# Patient Record
Sex: Female | Born: 1940 | Race: White | Hispanic: No | Smoking: Former smoker
Health system: Southern US, Community
[De-identification: ages and names within clinical notes are randomized; demographics above are authoritative.]

## PROBLEM LIST (undated history)

## (undated) DIAGNOSIS — E785 Hyperlipidemia, unspecified: Secondary | ICD-10-CM

## (undated) DIAGNOSIS — Z8489 Family history of other specified conditions: Secondary | ICD-10-CM

## (undated) DIAGNOSIS — I10 Essential (primary) hypertension: Secondary | ICD-10-CM

## (undated) DIAGNOSIS — E119 Type 2 diabetes mellitus without complications: Secondary | ICD-10-CM

## (undated) HISTORY — DX: Hyperlipidemia, unspecified: E78.5

## (undated) HISTORY — DX: Type 2 diabetes mellitus without complications: E11.9

## (undated) HISTORY — DX: Essential (primary) hypertension: I10

## (undated) HISTORY — PX: OTHER SURGICAL HISTORY: SHX169

---

## 2009-07-02 DIAGNOSIS — N3946 Mixed incontinence: Secondary | ICD-10-CM | POA: Insufficient documentation

## 2009-07-02 DIAGNOSIS — R339 Retention of urine, unspecified: Secondary | ICD-10-CM | POA: Insufficient documentation

## 2010-05-29 HISTORY — PX: SMALL INTESTINE SURGERY: SHX150

## 2013-05-29 HISTORY — PX: COLONOSCOPY: SHX174

## 2013-06-02 DIAGNOSIS — I10 Essential (primary) hypertension: Secondary | ICD-10-CM | POA: Diagnosis not present

## 2013-06-03 DIAGNOSIS — J301 Allergic rhinitis due to pollen: Secondary | ICD-10-CM | POA: Diagnosis not present

## 2013-06-05 DIAGNOSIS — J301 Allergic rhinitis due to pollen: Secondary | ICD-10-CM | POA: Diagnosis not present

## 2013-06-09 DIAGNOSIS — J3489 Other specified disorders of nose and nasal sinuses: Secondary | ICD-10-CM | POA: Diagnosis not present

## 2013-06-09 DIAGNOSIS — J3089 Other allergic rhinitis: Secondary | ICD-10-CM | POA: Diagnosis not present

## 2013-06-09 DIAGNOSIS — J343 Hypertrophy of nasal turbinates: Secondary | ICD-10-CM | POA: Diagnosis not present

## 2013-06-09 DIAGNOSIS — J342 Deviated nasal septum: Secondary | ICD-10-CM | POA: Diagnosis not present

## 2013-06-20 DIAGNOSIS — J301 Allergic rhinitis due to pollen: Secondary | ICD-10-CM | POA: Diagnosis not present

## 2013-06-23 DIAGNOSIS — J301 Allergic rhinitis due to pollen: Secondary | ICD-10-CM | POA: Diagnosis not present

## 2013-07-01 DIAGNOSIS — D37039 Neoplasm of uncertain behavior of the major salivary glands, unspecified: Secondary | ICD-10-CM | POA: Diagnosis not present

## 2013-07-04 DIAGNOSIS — J301 Allergic rhinitis due to pollen: Secondary | ICD-10-CM | POA: Diagnosis not present

## 2013-07-08 DIAGNOSIS — J301 Allergic rhinitis due to pollen: Secondary | ICD-10-CM | POA: Diagnosis not present

## 2013-07-22 DIAGNOSIS — J301 Allergic rhinitis due to pollen: Secondary | ICD-10-CM | POA: Diagnosis not present

## 2013-07-31 DIAGNOSIS — K5730 Diverticulosis of large intestine without perforation or abscess without bleeding: Secondary | ICD-10-CM | POA: Diagnosis not present

## 2013-07-31 DIAGNOSIS — Z1211 Encounter for screening for malignant neoplasm of colon: Secondary | ICD-10-CM | POA: Diagnosis not present

## 2013-07-31 DIAGNOSIS — E119 Type 2 diabetes mellitus without complications: Secondary | ICD-10-CM | POA: Diagnosis not present

## 2013-07-31 DIAGNOSIS — Z86010 Personal history of colonic polyps: Secondary | ICD-10-CM | POA: Diagnosis not present

## 2013-07-31 DIAGNOSIS — D126 Benign neoplasm of colon, unspecified: Secondary | ICD-10-CM | POA: Diagnosis not present

## 2013-08-05 DIAGNOSIS — J301 Allergic rhinitis due to pollen: Secondary | ICD-10-CM | POA: Diagnosis not present

## 2013-08-11 DIAGNOSIS — J301 Allergic rhinitis due to pollen: Secondary | ICD-10-CM | POA: Diagnosis not present

## 2013-08-12 DIAGNOSIS — E785 Hyperlipidemia, unspecified: Secondary | ICD-10-CM | POA: Diagnosis not present

## 2013-08-12 DIAGNOSIS — E119 Type 2 diabetes mellitus without complications: Secondary | ICD-10-CM | POA: Diagnosis not present

## 2013-08-12 DIAGNOSIS — Z23 Encounter for immunization: Secondary | ICD-10-CM | POA: Diagnosis not present

## 2013-08-12 DIAGNOSIS — I10 Essential (primary) hypertension: Secondary | ICD-10-CM | POA: Diagnosis not present

## 2013-08-12 DIAGNOSIS — M159 Polyosteoarthritis, unspecified: Secondary | ICD-10-CM | POA: Diagnosis not present

## 2013-08-28 DIAGNOSIS — J301 Allergic rhinitis due to pollen: Secondary | ICD-10-CM | POA: Diagnosis not present

## 2013-09-02 DIAGNOSIS — J301 Allergic rhinitis due to pollen: Secondary | ICD-10-CM | POA: Diagnosis not present

## 2013-09-04 DIAGNOSIS — J301 Allergic rhinitis due to pollen: Secondary | ICD-10-CM | POA: Diagnosis not present

## 2013-09-09 DIAGNOSIS — J301 Allergic rhinitis due to pollen: Secondary | ICD-10-CM | POA: Diagnosis not present

## 2013-09-18 DIAGNOSIS — J301 Allergic rhinitis due to pollen: Secondary | ICD-10-CM | POA: Diagnosis not present

## 2013-09-26 DIAGNOSIS — J301 Allergic rhinitis due to pollen: Secondary | ICD-10-CM | POA: Diagnosis not present

## 2013-09-30 DIAGNOSIS — J301 Allergic rhinitis due to pollen: Secondary | ICD-10-CM | POA: Diagnosis not present

## 2013-10-07 DIAGNOSIS — J301 Allergic rhinitis due to pollen: Secondary | ICD-10-CM | POA: Diagnosis not present

## 2013-10-15 DIAGNOSIS — J301 Allergic rhinitis due to pollen: Secondary | ICD-10-CM | POA: Diagnosis not present

## 2013-10-30 DIAGNOSIS — J301 Allergic rhinitis due to pollen: Secondary | ICD-10-CM | POA: Diagnosis not present

## 2013-11-07 DIAGNOSIS — J301 Allergic rhinitis due to pollen: Secondary | ICD-10-CM | POA: Diagnosis not present

## 2013-11-12 DIAGNOSIS — J301 Allergic rhinitis due to pollen: Secondary | ICD-10-CM | POA: Diagnosis not present

## 2013-11-17 DIAGNOSIS — D37039 Neoplasm of uncertain behavior of the major salivary glands, unspecified: Secondary | ICD-10-CM | POA: Diagnosis not present

## 2013-11-17 DIAGNOSIS — H6120 Impacted cerumen, unspecified ear: Secondary | ICD-10-CM | POA: Diagnosis not present

## 2013-11-17 DIAGNOSIS — H608X9 Other otitis externa, unspecified ear: Secondary | ICD-10-CM | POA: Diagnosis not present

## 2013-11-17 DIAGNOSIS — J3089 Other allergic rhinitis: Secondary | ICD-10-CM | POA: Diagnosis not present

## 2013-11-17 DIAGNOSIS — J342 Deviated nasal septum: Secondary | ICD-10-CM | POA: Diagnosis not present

## 2013-11-20 DIAGNOSIS — J301 Allergic rhinitis due to pollen: Secondary | ICD-10-CM | POA: Diagnosis not present

## 2013-11-27 DIAGNOSIS — J301 Allergic rhinitis due to pollen: Secondary | ICD-10-CM | POA: Diagnosis not present

## 2013-12-02 DIAGNOSIS — J301 Allergic rhinitis due to pollen: Secondary | ICD-10-CM | POA: Diagnosis not present

## 2013-12-05 DIAGNOSIS — J301 Allergic rhinitis due to pollen: Secondary | ICD-10-CM | POA: Diagnosis not present

## 2013-12-08 DIAGNOSIS — J301 Allergic rhinitis due to pollen: Secondary | ICD-10-CM | POA: Diagnosis not present

## 2013-12-19 DIAGNOSIS — J301 Allergic rhinitis due to pollen: Secondary | ICD-10-CM | POA: Diagnosis not present

## 2013-12-26 DIAGNOSIS — J301 Allergic rhinitis due to pollen: Secondary | ICD-10-CM | POA: Diagnosis not present

## 2014-01-06 DIAGNOSIS — J301 Allergic rhinitis due to pollen: Secondary | ICD-10-CM | POA: Diagnosis not present

## 2014-01-20 DIAGNOSIS — J301 Allergic rhinitis due to pollen: Secondary | ICD-10-CM | POA: Diagnosis not present

## 2014-01-29 DIAGNOSIS — J301 Allergic rhinitis due to pollen: Secondary | ICD-10-CM | POA: Diagnosis not present

## 2014-02-09 DIAGNOSIS — J301 Allergic rhinitis due to pollen: Secondary | ICD-10-CM | POA: Diagnosis not present

## 2014-02-09 DIAGNOSIS — E119 Type 2 diabetes mellitus without complications: Secondary | ICD-10-CM | POA: Diagnosis not present

## 2014-02-09 DIAGNOSIS — R82998 Other abnormal findings in urine: Secondary | ICD-10-CM | POA: Diagnosis not present

## 2014-02-13 DIAGNOSIS — M159 Polyosteoarthritis, unspecified: Secondary | ICD-10-CM | POA: Diagnosis not present

## 2014-02-13 DIAGNOSIS — E785 Hyperlipidemia, unspecified: Secondary | ICD-10-CM | POA: Diagnosis not present

## 2014-02-13 DIAGNOSIS — Z23 Encounter for immunization: Secondary | ICD-10-CM | POA: Diagnosis not present

## 2014-02-13 DIAGNOSIS — R82998 Other abnormal findings in urine: Secondary | ICD-10-CM | POA: Diagnosis not present

## 2014-02-13 DIAGNOSIS — E119 Type 2 diabetes mellitus without complications: Secondary | ICD-10-CM | POA: Diagnosis not present

## 2014-02-23 DIAGNOSIS — J301 Allergic rhinitis due to pollen: Secondary | ICD-10-CM | POA: Diagnosis not present

## 2014-02-24 DIAGNOSIS — H2510 Age-related nuclear cataract, unspecified eye: Secondary | ICD-10-CM | POA: Diagnosis not present

## 2014-02-26 DIAGNOSIS — D485 Neoplasm of uncertain behavior of skin: Secondary | ICD-10-CM | POA: Diagnosis not present

## 2014-02-26 DIAGNOSIS — D224 Melanocytic nevi of scalp and neck: Secondary | ICD-10-CM | POA: Diagnosis not present

## 2014-02-26 DIAGNOSIS — L821 Other seborrheic keratosis: Secondary | ICD-10-CM | POA: Diagnosis not present

## 2014-02-26 DIAGNOSIS — L920 Granuloma annulare: Secondary | ICD-10-CM | POA: Diagnosis not present

## 2014-03-02 DIAGNOSIS — J301 Allergic rhinitis due to pollen: Secondary | ICD-10-CM | POA: Diagnosis not present

## 2014-03-04 DIAGNOSIS — J301 Allergic rhinitis due to pollen: Secondary | ICD-10-CM | POA: Diagnosis not present

## 2014-03-12 DIAGNOSIS — J309 Allergic rhinitis, unspecified: Secondary | ICD-10-CM | POA: Diagnosis not present

## 2014-03-17 DIAGNOSIS — J309 Allergic rhinitis, unspecified: Secondary | ICD-10-CM | POA: Diagnosis not present

## 2014-03-27 DIAGNOSIS — J309 Allergic rhinitis, unspecified: Secondary | ICD-10-CM | POA: Diagnosis not present

## 2014-04-02 DIAGNOSIS — J309 Allergic rhinitis, unspecified: Secondary | ICD-10-CM | POA: Diagnosis not present

## 2014-04-09 DIAGNOSIS — J309 Allergic rhinitis, unspecified: Secondary | ICD-10-CM | POA: Diagnosis not present

## 2014-04-20 DIAGNOSIS — J309 Allergic rhinitis, unspecified: Secondary | ICD-10-CM | POA: Diagnosis not present

## 2014-05-05 DIAGNOSIS — Z1231 Encounter for screening mammogram for malignant neoplasm of breast: Secondary | ICD-10-CM | POA: Diagnosis not present

## 2014-05-12 DIAGNOSIS — J309 Allergic rhinitis, unspecified: Secondary | ICD-10-CM | POA: Diagnosis not present

## 2014-05-19 DIAGNOSIS — J343 Hypertrophy of nasal turbinates: Secondary | ICD-10-CM | POA: Diagnosis not present

## 2014-05-19 DIAGNOSIS — J342 Deviated nasal septum: Secondary | ICD-10-CM | POA: Diagnosis not present

## 2014-05-19 DIAGNOSIS — J309 Allergic rhinitis, unspecified: Secondary | ICD-10-CM | POA: Diagnosis not present

## 2014-05-19 DIAGNOSIS — R0981 Nasal congestion: Secondary | ICD-10-CM | POA: Diagnosis not present

## 2014-05-26 DIAGNOSIS — J309 Allergic rhinitis, unspecified: Secondary | ICD-10-CM | POA: Diagnosis not present

## 2014-06-12 DIAGNOSIS — J309 Allergic rhinitis, unspecified: Secondary | ICD-10-CM | POA: Diagnosis not present

## 2014-06-17 DIAGNOSIS — J3081 Allergic rhinitis due to animal (cat) (dog) hair and dander: Secondary | ICD-10-CM | POA: Diagnosis not present

## 2014-06-17 DIAGNOSIS — J3089 Other allergic rhinitis: Secondary | ICD-10-CM | POA: Diagnosis not present

## 2014-06-17 DIAGNOSIS — J301 Allergic rhinitis due to pollen: Secondary | ICD-10-CM | POA: Diagnosis not present

## 2014-06-17 DIAGNOSIS — J309 Allergic rhinitis, unspecified: Secondary | ICD-10-CM | POA: Diagnosis not present

## 2014-06-26 DIAGNOSIS — J309 Allergic rhinitis, unspecified: Secondary | ICD-10-CM | POA: Diagnosis not present

## 2014-07-03 DIAGNOSIS — J309 Allergic rhinitis, unspecified: Secondary | ICD-10-CM | POA: Diagnosis not present

## 2014-07-14 DIAGNOSIS — J309 Allergic rhinitis, unspecified: Secondary | ICD-10-CM | POA: Diagnosis not present

## 2014-07-24 DIAGNOSIS — I10 Essential (primary) hypertension: Secondary | ICD-10-CM | POA: Diagnosis not present

## 2014-08-03 DIAGNOSIS — J309 Allergic rhinitis, unspecified: Secondary | ICD-10-CM | POA: Diagnosis not present

## 2014-08-13 DIAGNOSIS — E119 Type 2 diabetes mellitus without complications: Secondary | ICD-10-CM | POA: Diagnosis not present

## 2014-08-13 DIAGNOSIS — M150 Primary generalized (osteo)arthritis: Secondary | ICD-10-CM | POA: Diagnosis not present

## 2014-08-13 DIAGNOSIS — M899 Disorder of bone, unspecified: Secondary | ICD-10-CM | POA: Diagnosis not present

## 2014-08-13 DIAGNOSIS — I10 Essential (primary) hypertension: Secondary | ICD-10-CM | POA: Diagnosis not present

## 2014-08-17 DIAGNOSIS — J309 Allergic rhinitis, unspecified: Secondary | ICD-10-CM | POA: Diagnosis not present

## 2014-08-24 DIAGNOSIS — J019 Acute sinusitis, unspecified: Secondary | ICD-10-CM | POA: Diagnosis not present

## 2014-09-11 DIAGNOSIS — J309 Allergic rhinitis, unspecified: Secondary | ICD-10-CM | POA: Diagnosis not present

## 2014-09-15 DIAGNOSIS — J301 Allergic rhinitis due to pollen: Secondary | ICD-10-CM | POA: Diagnosis not present

## 2014-09-15 DIAGNOSIS — J3081 Allergic rhinitis due to animal (cat) (dog) hair and dander: Secondary | ICD-10-CM | POA: Diagnosis not present

## 2014-09-15 DIAGNOSIS — J3089 Other allergic rhinitis: Secondary | ICD-10-CM | POA: Diagnosis not present

## 2014-10-02 DIAGNOSIS — J3081 Allergic rhinitis due to animal (cat) (dog) hair and dander: Secondary | ICD-10-CM | POA: Diagnosis not present

## 2014-10-09 DIAGNOSIS — J3081 Allergic rhinitis due to animal (cat) (dog) hair and dander: Secondary | ICD-10-CM | POA: Diagnosis not present

## 2014-10-20 DIAGNOSIS — J3081 Allergic rhinitis due to animal (cat) (dog) hair and dander: Secondary | ICD-10-CM | POA: Diagnosis not present

## 2014-10-28 DIAGNOSIS — J3081 Allergic rhinitis due to animal (cat) (dog) hair and dander: Secondary | ICD-10-CM | POA: Diagnosis not present

## 2014-11-13 DIAGNOSIS — J3081 Allergic rhinitis due to animal (cat) (dog) hair and dander: Secondary | ICD-10-CM | POA: Diagnosis not present

## 2014-11-17 DIAGNOSIS — H6063 Unspecified chronic otitis externa, bilateral: Secondary | ICD-10-CM | POA: Diagnosis not present

## 2014-11-17 DIAGNOSIS — J301 Allergic rhinitis due to pollen: Secondary | ICD-10-CM | POA: Diagnosis not present

## 2014-11-17 DIAGNOSIS — H6123 Impacted cerumen, bilateral: Secondary | ICD-10-CM | POA: Diagnosis not present

## 2014-11-17 DIAGNOSIS — J3081 Allergic rhinitis due to animal (cat) (dog) hair and dander: Secondary | ICD-10-CM | POA: Diagnosis not present

## 2014-11-17 DIAGNOSIS — R0981 Nasal congestion: Secondary | ICD-10-CM | POA: Diagnosis not present

## 2014-11-23 DIAGNOSIS — E1142 Type 2 diabetes mellitus with diabetic polyneuropathy: Secondary | ICD-10-CM | POA: Diagnosis not present

## 2014-11-23 DIAGNOSIS — I872 Venous insufficiency (chronic) (peripheral): Secondary | ICD-10-CM | POA: Diagnosis not present

## 2014-11-23 DIAGNOSIS — B351 Tinea unguium: Secondary | ICD-10-CM | POA: Diagnosis not present

## 2014-11-23 DIAGNOSIS — Q6689 Other  specified congenital deformities of feet: Secondary | ICD-10-CM | POA: Diagnosis not present

## 2014-11-23 DIAGNOSIS — M2011 Hallux valgus (acquired), right foot: Secondary | ICD-10-CM | POA: Diagnosis not present

## 2014-11-23 DIAGNOSIS — M2022 Hallux rigidus, left foot: Secondary | ICD-10-CM | POA: Diagnosis not present

## 2014-12-03 DIAGNOSIS — J3081 Allergic rhinitis due to animal (cat) (dog) hair and dander: Secondary | ICD-10-CM | POA: Diagnosis not present

## 2014-12-11 DIAGNOSIS — J3081 Allergic rhinitis due to animal (cat) (dog) hair and dander: Secondary | ICD-10-CM | POA: Diagnosis not present

## 2015-01-13 ENCOUNTER — Ambulatory Visit (INDEPENDENT_AMBULATORY_CARE_PROVIDER_SITE_OTHER): Payer: Medicare Other | Admitting: Family Medicine

## 2015-01-13 ENCOUNTER — Encounter: Payer: Self-pay | Admitting: Family Medicine

## 2015-01-13 VITALS — BP 132/64 | HR 64 | Ht 65.0 in | Wt 177.0 lb

## 2015-01-13 DIAGNOSIS — Z1239 Encounter for other screening for malignant neoplasm of breast: Secondary | ICD-10-CM | POA: Diagnosis not present

## 2015-01-13 DIAGNOSIS — E663 Overweight: Secondary | ICD-10-CM | POA: Insufficient documentation

## 2015-01-13 DIAGNOSIS — Z8249 Family history of ischemic heart disease and other diseases of the circulatory system: Secondary | ICD-10-CM

## 2015-01-13 DIAGNOSIS — Z86010 Personal history of colonic polyps: Secondary | ICD-10-CM | POA: Diagnosis not present

## 2015-01-13 DIAGNOSIS — J309 Allergic rhinitis, unspecified: Secondary | ICD-10-CM

## 2015-01-13 DIAGNOSIS — E559 Vitamin D deficiency, unspecified: Secondary | ICD-10-CM | POA: Insufficient documentation

## 2015-01-13 DIAGNOSIS — M25551 Pain in right hip: Secondary | ICD-10-CM

## 2015-01-13 DIAGNOSIS — G8929 Other chronic pain: Secondary | ICD-10-CM

## 2015-01-13 DIAGNOSIS — Z823 Family history of stroke: Secondary | ICD-10-CM

## 2015-01-13 DIAGNOSIS — E785 Hyperlipidemia, unspecified: Secondary | ICD-10-CM | POA: Diagnosis not present

## 2015-01-13 DIAGNOSIS — I10 Essential (primary) hypertension: Secondary | ICD-10-CM

## 2015-01-13 DIAGNOSIS — R600 Localized edema: Secondary | ICD-10-CM

## 2015-01-13 DIAGNOSIS — E119 Type 2 diabetes mellitus without complications: Secondary | ICD-10-CM | POA: Diagnosis not present

## 2015-01-13 DIAGNOSIS — Z9889 Other specified postprocedural states: Secondary | ICD-10-CM

## 2015-01-13 DIAGNOSIS — Z9049 Acquired absence of other specified parts of digestive tract: Secondary | ICD-10-CM

## 2015-01-13 MED ORDER — MULTIVITAMINS PO CAPS: 1.0000 | ORAL_CAPSULE | Freq: Every day | ORAL | Status: AC

## 2015-01-13 MED ORDER — GLUCOSAMINE-CHONDROITIN 500-400 MG PO TABS: 1.0000 | ORAL_TABLET | Freq: Two times a day (BID) | ORAL | Status: DC

## 2015-01-13 MED ORDER — OLOPATADINE HCL 0.1 % OP SOLN: 1.0000 [drp] | Freq: Two times a day (BID) | OPHTHALMIC | Status: DC | PRN

## 2015-01-13 MED ORDER — TRIAMCINOLONE ACETONIDE 55 MCG/ACT NA AERO: 2.0000 | INHALATION_SPRAY | Freq: Every day | NASAL | Status: DC

## 2015-01-13 NOTE — Progress Notes (Signed)
Date:  01/13/2015   Name:  Angela Pope   DOB:  07-Nov-1940   MRN:  [id number]  PCP:  Adline Potter, MD    Chief Complaint: Establish Care   History of Present Illness:  This is a 74 y.o. female with T2DM, HTN, and HLD for establishment of care, just moved here from [place] with mother. DM well controlled with a1c less than 7%, saw podiatrist and optho in [place], no known neuro or eye complications. Lisinopril dose increased 22m ago. On Zocor for yrs, last lipids 43m ago ok. Had partial colectomy for polyp about 2012, two colonoscopies since with other polyp removal, due for one next year. Hx AR, uses Nasacort regularly and patanol prn. C/o chronic R hip pain for which previous MD recommended glucosamine/chondroitin. Also takes MVI and ca/vit D supplement. Tdap 2012, has had both pneumonia imms and shingles imm, gets annual mammograms, due for one next month. Eats 2 meals daily, avoids red meat, no regular exercise. Has chronic BLE edema that improves overnight.  Review of Systems:  Review of Systems  Constitutional: Negative for fever and fatigue.  HENT: Negative for ear pain and sore throat.   Eyes: Negative for pain.  Respiratory: Negative for cough and shortness of breath.   Cardiovascular: Positive for leg swelling. Negative for chest pain and palpitations.  Gastrointestinal: Negative for abdominal pain.  Endocrine: Negative for polyuria.  Genitourinary: Negative for difficulty urinating and pelvic pain.  Musculoskeletal: Negative for back pain and neck pain.  Skin: Negative for rash.  Neurological: Negative for tremors, syncope and headaches.  Hematological: Negative for adenopathy.  Psychiatric/Behavioral: Negative for confusion and agitation.    Patient Active Problem List   Diagnosis Date Noted  . HTN (hypertension) 01/13/2015  . Type 2 diabetes mellitus 01/13/2015  . Hyperlipidemia 01/13/2015  . Overweight (BMI 25.0-29.9) 01/13/2015  . Allergic rhinitis 01/13/2015  . Hx of  colonic polyps 01/13/2015  . 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 Vitamin D deficiency 01/13/2015  . FH: heart disease 01/13/2015  . FH: stroke 01/13/2015    Prior to Admission medications   Medication Sig Start Date End Date Taking? Authorizing Provider  lisinopril (PRINIVIL,ZESTRIL) 40 MG tablet Take 40 mg by mouth daily.   Yes Historical Provider, MD  simvastatin (ZOCOR) 80 MG tablet Take 40 mg by mouth daily.   Yes Historical Provider, MD    Allergies  Allergen Reactions  . Clarithromycin   . Sulfa Antibiotics   . Tetracyclines & Related     Past Surgical History  Procedure Laterality Date  . Small intestine surgery  2012    polyp that could't be removed  . Colonoscopy  2015    Social History  Substance Use Topics  . Smoking status: Former Research scientist (life sciences)  . Smokeless tobacco: None  . Alcohol Use: No    Family History  Problem Relation Age of Onset  . Heart disease Mother   . Hypertension Mother   . Diabetes Brother   . Heart disease Brother   . Hypertension Brother   . Diabetes Paternal Grandfather     Medication list has been reviewed and updated.  Physical Examination: BP 132/64 mmHg  Pulse 64  Ht 5\' 5"  (1.651 m)  Wt 177 lb (80.287 kg)  BMI 29.45 kg/m2  Physical Exam  Constitutional: She is oriented to person, place, and time. She appears well-developed and well-nourished.  HENT:  Head: Normocephalic and atraumatic.  Right Ear: External  ear normal.  Left Ear: External ear normal.  Nose: Nose normal.  Mouth/Throat: Oropharynx is clear and moist.  Eyes: Conjunctivae and EOM are normal. Pupils are equal, round, and reactive to light. No scleral icterus.  Neck: Neck supple. No thyromegaly present.  Cardiovascular: Normal rate, regular rhythm and normal heart sounds.   Pulmonary/Chest: Effort normal and breath sounds normal.  Abdominal: Soft. She exhibits no distension and no mass. There is no tenderness.   Musculoskeletal:  1+ BLE edema  Lymphadenopathy:    She has no cervical adenopathy.  Neurological: She is alert and oriented to person, place, and time. Coordination normal.  Skin: Skin is warm and dry. No rash noted.  Psychiatric: She has a normal mood and affect. Her behavior is normal.    Assessment and Plan:  1. Essential hypertension Well controlled, continue lisinopril - lisinopril (PRINIVIL,ZESTRIL) 40 MG tablet; Take 40 mg by mouth daily. - Comprehensive metabolic panel - CBC  2. Type 2 diabetes mellitus without complication Well controlled by report - HgB A1c - Ambulatory referral to Podiatry - Ambulatory referral to Ophthalmology  3. Hyperlipidemia Well controlled by report, continue Zocor - simvastatin (ZOCOR) 80 MG tablet; Take 40 mg by mouth daily. - TSH - Lipid Profile  4. Overweight (BMI 25.0-29.9) Discussed weight loss/exercise  5. Allergic rhinitis, unspecified allergic rhinitis type Continue Nasacort/Patanol prn  6. Hx of colonic polyps - Ambulatory referral to Gastroenterology  7. Hx of resection of large bowel  8. 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 Vitamin D deficiency On ca/vit D supp per prev MD, d/c ca supplement due to cardiac concerns - Vitamin D (25 hydroxy)  10. Breast cancer screening D/c next year - MM Digital Screening; Future  11. FH: heart disease  12. FH: stroke  Return in about 4 weeks (around 02/10/2015).  [name]. [facility]  01/13/2015

## 2015-01-14 DIAGNOSIS — R600 Localized edema: Secondary | ICD-10-CM | POA: Insufficient documentation

## 2015-01-14 LAB — VITAMIN D 25 HYDROXY (VIT D DEFICIENCY, FRACTURES): VIT D 25 HYDROXY: 25 ng/mL — AB (ref 30.0–100.0)

## 2015-01-14 LAB — COMPREHENSIVE METABOLIC PANEL
ALBUMIN: 4.3 g/dL (ref 3.5–4.8)
ALT: 20 IU/L (ref 0–32)
AST: 22 IU/L (ref 0–40)
Albumin/Globulin Ratio: 2.2 (ref 1.1–2.5)
Alkaline Phosphatase: 61 IU/L (ref 39–117)
BILIRUBIN TOTAL: 1.2 mg/dL (ref 0.0–1.2)
BUN/Creatinine Ratio: 26 (ref 11–26)
BUN: 18 mg/dL (ref 8–27)
CHLORIDE: 103 mmol/L (ref 97–108)
CO2: 27 mmol/L (ref 18–29)
CREATININE: 0.69 mg/dL (ref 0.57–1.00)
Calcium: 9.4 mg/dL (ref 8.7–10.3)
GFR calc non Af Amer: 87 mL/min/{1.73_m2} (ref >59)
GFR, EST AFRICAN AMERICAN: 100 mL/min/{1.73_m2} (ref >59)
GLUCOSE: 123 mg/dL — AB (ref 65–99)
Globulin, Total: 2 g/dL (ref 1.5–4.5)
Potassium: 4.3 mmol/L (ref 3.5–5.2)
Sodium: 144 mmol/L (ref 134–144)
TOTAL PROTEIN: 6.3 g/dL (ref 6.0–8.5)

## 2015-01-14 LAB — CBC
HEMATOCRIT: 46.3 % (ref 34.0–46.6)
HEMOGLOBIN: 15.5 g/dL (ref 11.1–15.9)
MCH: 30.9 pg (ref 26.6–33.0)
MCHC: 33.5 g/dL (ref 31.5–35.7)
MCV: 92 fL (ref 79–97)
Platelets: 317 10*3/uL (ref 150–379)
RBC: 5.02 x10E6/uL (ref 3.77–5.28)
RDW: 12.9 % (ref 12.3–15.4)
WBC: 6.5 10*3/uL (ref 3.4–10.8)

## 2015-01-14 LAB — HEMOGLOBIN A1C
Est. average glucose Bld gHb Est-mCnc: 146 mg/dL
Hgb A1c MFr Bld: 6.7 % — ABNORMAL HIGH (ref 4.8–5.6)

## 2015-01-14 LAB — LIPID PANEL
CHOL/HDL RATIO: 3 ratio (ref 0.0–4.4)
Cholesterol, Total: 142 mg/dL (ref 100–199)
HDL: 47 mg/dL (ref >39)
LDL CALC: 65 mg/dL (ref 0–99)
TRIGLYCERIDES: 152 mg/dL — AB (ref 0–149)
VLDL Cholesterol Cal: 30 mg/dL (ref 5–40)

## 2015-01-14 LAB — TSH: TSH: 1.06 u[IU]/mL (ref 0.450–4.500)

## 2015-01-14 MED ORDER — VITAMIN D 50 MCG (2000 UT) PO CAPS: 1.0000 | ORAL_CAPSULE | Freq: Every day | ORAL | Status: DC

## 2015-01-14 NOTE — Addendum Note (Signed)
Addended by: Adline Potter on: 01/14/2015 08:53 AM   Modules accepted: Orders

## 2015-02-03 DIAGNOSIS — E119 Type 2 diabetes mellitus without complications: Secondary | ICD-10-CM | POA: Diagnosis not present

## 2015-02-03 DIAGNOSIS — L603 Nail dystrophy: Secondary | ICD-10-CM | POA: Diagnosis not present

## 2015-02-10 ENCOUNTER — Other Ambulatory Visit: Payer: Self-pay

## 2015-02-10 DIAGNOSIS — E785 Hyperlipidemia, unspecified: Secondary | ICD-10-CM

## 2015-02-10 DIAGNOSIS — I10 Essential (primary) hypertension: Secondary | ICD-10-CM

## 2015-02-10 MED ORDER — SIMVASTATIN 80 MG PO TABS: 40.0000 mg | ORAL_TABLET | Freq: Every day | ORAL | Status: DC

## 2015-02-10 MED ORDER — LISINOPRIL 40 MG PO TABS: 40.0000 mg | ORAL_TABLET | Freq: Every day | ORAL | Status: DC

## 2015-02-10 NOTE — Addendum Note (Signed)
Addended by: Adline Potter on: 02/10/2015 03:46 PM   Modules accepted: Orders

## 2015-02-19 DIAGNOSIS — H2513 Age-related nuclear cataract, bilateral: Secondary | ICD-10-CM | POA: Diagnosis not present

## 2015-03-08 ENCOUNTER — Encounter (INDEPENDENT_AMBULATORY_CARE_PROVIDER_SITE_OTHER): Payer: Self-pay

## 2015-03-08 ENCOUNTER — Encounter: Payer: Self-pay | Admitting: Gastroenterology

## 2015-03-08 ENCOUNTER — Ambulatory Visit (INDEPENDENT_AMBULATORY_CARE_PROVIDER_SITE_OTHER): Payer: Medicare Other | Admitting: Gastroenterology

## 2015-03-08 VITALS — BP 146/71 | HR 80 | Temp 99.4°F | Ht 65.0 in | Wt 181.0 lb

## 2015-03-08 DIAGNOSIS — Z86010 Personal history of colonic polyps: Secondary | ICD-10-CM

## 2015-03-08 NOTE — Progress Notes (Signed)
    Gastroenterology Consultation  Referring Provider:     Adline Potter, MD Primary Care Physician:  Adline Potter, MD Primary Gastroenterologist:  Dr. Allen Norris     Reason for Consultation:     Wanted at to meet me for continuation of care         HPI:   Angela Pope is a 74 y.o. y/o female referred for consultation & management of continuation of care by Dr. Adline Potter, MD.  This patient reports that she needs another colonoscopy in a year or 2. She is not sure when she is supposed to have the colonoscopy and states that she will order her records. The patient has no complaints at the present time and just wanted to meet me to establish care with a new gastrologist.  Past Medical History  Diagnosis Date  . Diabetes mellitus without complication ([place])     controls with diet  . Hypertension   . Hyperlipidemia     Past Surgical History  Procedure Laterality Date  . Small intestine surgery  2012    polyp that could't be removed  . Colonoscopy  2015  . Large intestine surgery      Prior to Admission medications   Medication Sig Start Date End Date Taking? Authorizing Provider  Cholecalciferol (VITAMIN D) 2000 UNITS CAPS Take 1 capsule (2,000 Units total) by mouth daily. 01/14/15  Yes Adline Potter, MD  glucosamine-chondroitin (MAX GLUCOSAMINE CHONDROITIN) 500-400 MG tablet Take 1 tablet by mouth 2 (two) times daily. 01/13/15  Yes Adline Potter, MD  lisinopril (PRINIVIL,ZESTRIL) 40 MG tablet Take 1 tablet (40 mg total) by mouth daily. 02/10/15  Yes Adline Potter, MD  Multiple Vitamin (MULTIVITAMIN) capsule Take 1 capsule by mouth daily. 01/13/15  Yes Adline Potter, MD  olopatadine (PATANOL) 0.1 % ophthalmic solution Place 1 drop into both eyes 2 (two) times daily as needed for allergies. 01/13/15  Yes Adline Potter, MD  simvastatin (ZOCOR) 80 MG tablet Take 0.5 tablets (40 mg total) by mouth daily. 02/10/15  Yes Adline Potter, MD  triamcinolone (NASACORT AQ) 55 MCG/ACT AERO nasal inhaler  Place 2 sprays into the nose daily. 01/13/15  Yes Adline Potter, MD    Family History  Problem Relation Age of Onset  . Heart disease Mother   . Hypertension Mother   . Diabetes Brother   . Heart disease Brother   . Hypertension Brother   . Diabetes Paternal Grandfather      Social History  Substance Use Topics  . Smoking status: Former Research scientist (life sciences)  . Smokeless tobacco: Never Used  . Alcohol Use: No    Allergies as of 03/08/2015 - Review Complete 03/08/2015  Allergen Reaction Noted  . Clarithromycin  01/13/2015  . Sulfa antibiotics  01/13/2015  . Tetracyclines & related  01/13/2015    Assessment and Plan:   Angela Pope is a 74 y.o. y/o female who comes for establishment of further care after moving from [place]. The patient had a colonoscopy a large polyp removed and had part of her transverse and ascending colon resected. The patient is not sure when she needs a next colonoscopy. The patient will have her records sent from her previous gastrologist and I will determine when she needs her next colonoscopy.   Note: This dictation was prepared with Dragon dictation along with smaller phrase technology. Any transcriptional errors that result from this process are unintentional.

## 2015-03-10 ENCOUNTER — Ambulatory Visit (INDEPENDENT_AMBULATORY_CARE_PROVIDER_SITE_OTHER): Payer: Medicare Other

## 2015-03-10 DIAGNOSIS — Z23 Encounter for immunization: Secondary | ICD-10-CM

## 2015-05-20 ENCOUNTER — Encounter: Payer: Self-pay | Admitting: Family Medicine

## 2015-05-20 ENCOUNTER — Ambulatory Visit (INDEPENDENT_AMBULATORY_CARE_PROVIDER_SITE_OTHER): Payer: Medicare Other | Admitting: Family Medicine

## 2015-05-20 VITALS — BP 132/88 | HR 80 | Temp 99.0°F | Ht 65.0 in | Wt 186.0 lb

## 2015-05-20 DIAGNOSIS — J0100 Acute maxillary sinusitis, unspecified: Secondary | ICD-10-CM

## 2015-05-20 DIAGNOSIS — I10 Essential (primary) hypertension: Secondary | ICD-10-CM | POA: Diagnosis not present

## 2015-05-20 DIAGNOSIS — E559 Vitamin D deficiency, unspecified: Secondary | ICD-10-CM | POA: Diagnosis not present

## 2015-05-20 DIAGNOSIS — E119 Type 2 diabetes mellitus without complications: Secondary | ICD-10-CM

## 2015-05-20 DIAGNOSIS — E785 Hyperlipidemia, unspecified: Secondary | ICD-10-CM

## 2015-05-20 MED ORDER — AMOXICILLIN 875 MG PO TABS: 875.0000 mg | ORAL_TABLET | Freq: Two times a day (BID) | ORAL | Status: DC

## 2015-05-20 NOTE — Progress Notes (Signed)
Date:  05/20/2015   Name:  Angela Pope   DOB:  Mar 04, 1941   MRN:  [id number]  PCP:  Adline Potter, MD    Chief Complaint: Sinusitis; Sore Throat; and Cough   History of Present Illness:  This is a 74 y.o. female with T2DM, HTN, HLD, vit D def here for 2d hx of LG fever, sinus congestion, sore throat, cough prod yellow/green phlegm. Ibuprofen helps some. Also hx R parotid gland swelling felt related to allergies, not bothering currently. Not checking sugars at home (last a1c 6.7% 12/2014), taking vit D supplement.  Review of Systems:  Review of Systems  HENT: Negative for ear pain and trouble swallowing.   Respiratory: Negative for shortness of breath.   Cardiovascular: Negative for chest pain.  Endocrine: Negative for polyuria.  Genitourinary: Negative for difficulty urinating.  Neurological: Negative for syncope and light-headedness.    Patient Active Problem List   Diagnosis Date Noted  . Bilateral lower extremity edema 01/14/2015  . HTN (hypertension) 01/13/2015  . Diabetes mellitus type 2, controlled, without complications ([place]) [id number]  . Hyperlipidemia 01/13/2015  . Overweight (BMI 25.0-29.9) 01/13/2015  . Allergic rhinitis 01/13/2015  . Hx of colonic polyps 01/13/2015  . 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 Vitamin D deficiency 01/13/2015  . FH: heart disease 01/13/2015  . FH: stroke 01/13/2015    Prior to Admission medications   Medication Sig Start Date End Date Taking? Authorizing Provider  amoxicillin (AMOXIL) 875 MG tablet Take 1 tablet (875 mg total) by mouth 2 (two) times daily. 05/20/15   Adline Potter, MD  Cholecalciferol (VITAMIN D) 2000 UNITS CAPS Take 1 capsule (2,000 Units total) by mouth daily. 01/14/15   Adline Potter, MD  glucosamine-chondroitin (MAX GLUCOSAMINE CHONDROITIN) 500-400 MG tablet Take 1 tablet by mouth 2 (two) times daily. 01/13/15   Adline Potter, MD  lisinopril (PRINIVIL,ZESTRIL) 40 MG  tablet Take 1 tablet (40 mg total) by mouth daily. 02/10/15   Adline Potter, MD  Multiple Vitamin (MULTIVITAMIN) capsule Take 1 capsule by mouth daily. 01/13/15   Adline Potter, MD  olopatadine (PATANOL) 0.1 % ophthalmic solution Place 1 drop into both eyes 2 (two) times daily as needed for allergies. 01/13/15   Adline Potter, MD  simvastatin (ZOCOR) 80 MG tablet Take 0.5 tablets (40 mg total) by mouth daily. 02/10/15   Adline Potter, MD  triamcinolone (NASACORT AQ) 55 MCG/ACT AERO nasal inhaler Place 2 sprays into the nose daily. 01/13/15   Adline Potter, MD    Allergies  Allergen Reactions  . Clarithromycin   . Sulfa Antibiotics   . Tetracyclines & Related     Past Surgical History  Procedure Laterality Date  . Small intestine surgery  2012    polyp that could't be removed  . Colonoscopy  2015  . Large intestine surgery      Social History  Substance Use Topics  . Smoking status: Former Research scientist (life sciences)  . Smokeless tobacco: Never Used  . Alcohol Use: No    Family History  Problem Relation Age of Onset  . Heart disease Mother   . Hypertension Mother   . Diabetes Brother   . Heart disease Brother   . Hypertension Brother   . Diabetes Paternal Grandfather     Medication list has been reviewed and updated.  Physical Examination: BP 132/88 mmHg  Pulse 80  Temp(Src) 99 F (37.2 C)  Ht 5\' 5"  (1.651 m)  Wt 186 lb (84.369  kg)  BMI 30.95 kg/m2  SpO2 96%  Physical Exam  Constitutional: She appears well-developed and well-nourished.  HENT:  Right Ear: External ear normal.  Left Ear: External ear normal.  Nose: Nose normal.  Mouth/Throat: Oropharynx is clear and moist.  Moderate B max sinus tenderness  Neck: Neck supple.  Pulmonary/Chest: Effort normal and breath sounds normal.  Lymphadenopathy:    She has no cervical adenopathy.  Neurological: She is alert.  Skin: Skin is warm and dry.  Psychiatric: She has a normal mood and affect. Her behavior is normal.  Nursing note and  vitals reviewed.   Assessment and Plan:  1. Acute maxillary sinusitis, recurrence not specified Amox x 10d (multiple abx allergies noted), call if sxs worsen/persist  2. Essential hypertension Well controlled, cont lisinopril  3. Controlled type 2 diabetes mellitus without complication, without long-term current use of insulin ([place]) Well controlled, recheck a1c - HgB A1c; Future  4. Hyperlipidemia Well controlled, consider decrease Zocor to 40 mg daily next refill  5. Vitamin D deficiency On supplement, recheck level - Vitamin D (25 hydroxy); Future  Return in about 3 months (around 08/18/2015).  [name]. Leisl Spurrier, [facility]  05/20/2015

## 2015-09-27 DIAGNOSIS — L603 Nail dystrophy: Secondary | ICD-10-CM | POA: Diagnosis not present

## 2015-10-19 ENCOUNTER — Ambulatory Visit (INDEPENDENT_AMBULATORY_CARE_PROVIDER_SITE_OTHER): Payer: Medicare Other | Admitting: Family Medicine

## 2015-10-19 ENCOUNTER — Encounter: Payer: Self-pay | Admitting: Family Medicine

## 2015-10-19 VITALS — BP 142/90 | HR 87 | Temp 98.0°F | Resp 16 | Ht 65.0 in | Wt 183.0 lb

## 2015-10-19 DIAGNOSIS — R600 Localized edema: Secondary | ICD-10-CM | POA: Diagnosis not present

## 2015-10-19 DIAGNOSIS — J0111 Acute recurrent frontal sinusitis: Secondary | ICD-10-CM

## 2015-10-19 DIAGNOSIS — I10 Essential (primary) hypertension: Secondary | ICD-10-CM | POA: Diagnosis not present

## 2015-10-19 DIAGNOSIS — E785 Hyperlipidemia, unspecified: Secondary | ICD-10-CM

## 2015-10-19 DIAGNOSIS — J309 Allergic rhinitis, unspecified: Secondary | ICD-10-CM | POA: Diagnosis not present

## 2015-10-19 DIAGNOSIS — E559 Vitamin D deficiency, unspecified: Secondary | ICD-10-CM | POA: Diagnosis not present

## 2015-10-19 DIAGNOSIS — E119 Type 2 diabetes mellitus without complications: Secondary | ICD-10-CM | POA: Diagnosis not present

## 2015-10-19 DIAGNOSIS — T17908A Unspecified foreign body in respiratory tract, part unspecified causing other injury, initial encounter: Secondary | ICD-10-CM

## 2015-10-19 MED ORDER — AMOXICILLIN 875 MG PO TABS: 875.0000 mg | ORAL_TABLET | Freq: Two times a day (BID) | ORAL | Status: DC

## 2015-10-19 MED ORDER — HYDROCHLOROTHIAZIDE 25 MG PO TABS
25.0000 mg | ORAL_TABLET | Freq: Every day | ORAL | Status: DC
Start: 2015-10-19 — End: 2016-01-24

## 2015-10-19 MED ORDER — LORATADINE 10 MG PO TABS: 10.0000 mg | ORAL_TABLET | Freq: Every day | ORAL | Status: DC

## 2015-10-19 NOTE — Progress Notes (Signed)
Date:  10/19/2015   Name:  Angela Pope   DOB:  Oct 10, 1940   MRN:  [id number]  PCP:  Adline Potter, MD    Chief Complaint: Cough and Dizziness   History of Present Illness:  This is a 75 y.o. female seen for 5 month f/u. Mother died 2015/10/05. Did not get scheduled blood work in March. Yesterday had severe coughing episode with stridor while driving, couldn't breathe, developed pain in both arms, resolved spontaneously, no pulmonary sxs today except some chest wall pain. Does c/o feeling lightheaded with significant sinus congestion. 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 Home BG 104 fasting recently. Taking lisinopril for HTN, c/o increased BLE edema that improves a little overnight. Taking Zocor and vit D supplement, saw optho 6-9 months ago. Hx parotid tumor, per ENT needs MRI q67yrs, due soon.  Review of Systems:  Review of Systems  Constitutional: Negative for fever and chills.  HENT: Negative for facial swelling and trouble swallowing.   Endocrine: Negative for polyuria.  Genitourinary: Negative for difficulty urinating.  Neurological: Negative for syncope.    Patient Active Problem List   Diagnosis Date Noted  . Bilateral lower extremity edema 01/14/2015  . HTN (hypertension) 01/13/2015  . Diabetes mellitus type 2, controlled, without complications ([place]) [id number]  . Hyperlipidemia 01/13/2015  . Overweight (BMI 25.0-29.9) 01/13/2015  . Allergic rhinitis 01/13/2015  . Hx of colonic polyps 01/13/2015  . 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 Vitamin D deficiency 01/13/2015  . FH: heart disease 01/13/2015  . FH: stroke 01/13/2015    Prior to Admission medications   Medication Sig Start Date End Date Taking? Authorizing Provider  Cholecalciferol (VITAMIN D) 2000 UNITS CAPS Take 1 capsule (2,000 Units total) by mouth daily.  01/14/15  Yes Adline Potter, MD  glucosamine-chondroitin (MAX GLUCOSAMINE CHONDROITIN) 500-400 MG tablet Take 1 tablet by mouth 2 (two) times daily. 01/13/15  Yes Adline Potter, MD  lisinopril (PRINIVIL,ZESTRIL) 40 MG tablet Take 1 tablet (40 mg total) by mouth daily. 02/10/15  Yes Adline Potter, MD  Multiple Vitamin (MULTIVITAMIN) capsule Take 1 capsule by mouth daily. 01/13/15  Yes Adline Potter, MD  olopatadine (PATANOL) 0.1 % ophthalmic solution Place 1 drop into both eyes 2 (two) times daily as needed for allergies. 01/13/15  Yes Adline Potter, MD  simvastatin (ZOCOR) 80 MG tablet Take 0.5 tablets (40 mg total) by mouth daily. 02/10/15  Yes Adline Potter, MD  triamcinolone (NASACORT AQ) 55 MCG/ACT AERO nasal inhaler Place 2 sprays into the nose daily. 01/13/15  Yes Adline Potter, MD  amoxicillin (AMOXIL) 875 MG tablet Take 1 tablet (875 mg total) by mouth 2 (two) times daily. 10/19/15   Adline Potter, MD  hydrochlorothiazide (HYDRODIURIL) 25 MG tablet Take 1 tablet (25 mg total) by mouth daily. 10/19/15   Adline Potter, MD  loratadine (CLARITIN) 10 MG tablet Take 1 tablet (10 mg total) by mouth daily. 10/19/15   Adline Potter, MD    Allergies  Allergen Reactions  . Clarithromycin   . Sulfa Antibiotics   . Tetracyclines & Related     Past Surgical History  Procedure Laterality Date  . Small intestine surgery  2012    polyp that could't be removed  . Colonoscopy  2015  . Large intestine surgery      Social History  Substance Use Topics  .  Smoking status: Former Smoker    Quit date: 10/18/1965  . Smokeless tobacco: Never Used  . Alcohol Use: No    Family History  Problem Relation Age of Onset  . Heart disease Mother   . Hypertension Mother   . Diabetes Brother   . Heart disease Brother   . Hypertension Brother   . Diabetes Paternal Grandfather     Medication list has been reviewed and updated.  Physical Examination: BP 142/90 mmHg  Pulse 87  Temp(Src) 98 F (36.7 C) (Oral)   Resp 16  Ht 5\' 5"  (1.651 m)  Wt 183 lb (83.008 kg)  BMI 30.45 kg/m2  SpO2 96%  Physical Exam  Constitutional: She is oriented to person, place, and time. She appears well-developed and well-nourished.  HENT:  Right Ear: External ear normal.  Left Ear: External ear normal.  Mouth/Throat: Oropharynx is clear and moist.  TM's clear Moderate B max and frontal sinus tenderness  Eyes: Conjunctivae are normal.  Neck: Neck supple. No tracheal deviation present. No thyromegaly present.  Cardiovascular: Normal rate, regular rhythm and normal heart sounds.   Pulmonary/Chest: Effort normal and breath sounds normal. No stridor. She has no wheezes. She has no rales.  Musculoskeletal:  2+ BLE edema  Lymphadenopathy:    She has no cervical adenopathy.  Neurological: She is alert and oriented to person, place, and time.  Skin: Skin is warm and dry.  Psychiatric: She has a normal mood and affect. Her behavior is normal.  Nursing note and vitals reviewed.   Assessment and Plan:  1. Aspiration into airway, initial encounter Likely acute aspiration of mucus plug yesterday, no sequelae noted  2. Recurrent frontal sinusitis, unspecified chronicity Amox 875 mg bid x 10d - Ambulatory referral to ENT  3. Allergic rhinitis, unspecified allergic rhinitis type Begin Claritin in addition to Nasocort  4. Essential hypertension Marginal control, add HCTZ 25 mg daily to lisinopril  5. Controlled type 2 diabetes mellitus without complication, without long-term current use of insulin (HCC) Diet controlled, last a1c 6.7% in August - HgB A1c - Urine Microalbumin w/creat. ratio  6. Hyperlipidemia Well controlled on Zocor (LDL 65 in August)  7. Vitamin D deficiency On supplement - Vitamin D (25 hydroxy)  8. Bilateral lower extremity edema HCTZ should help  Return in about 4 weeks (around 11/16/2015).  [name]. [facility]  10/19/2015

## 2015-10-20 LAB — VITAMIN D 25 HYDROXY (VIT D DEFICIENCY, FRACTURES): Vit D, 25-Hydroxy: 30.9 ng/mL (ref 30.0–100.0)

## 2015-10-20 LAB — HEMOGLOBIN A1C
ESTIMATED AVERAGE GLUCOSE: 148 mg/dL
Hgb A1c MFr Bld: 6.8 % — ABNORMAL HIGH (ref 4.8–5.6)

## 2015-11-03 DIAGNOSIS — H6123 Impacted cerumen, bilateral: Secondary | ICD-10-CM | POA: Diagnosis not present

## 2015-11-03 DIAGNOSIS — H6060 Unspecified chronic otitis externa, unspecified ear: Secondary | ICD-10-CM | POA: Diagnosis not present

## 2015-11-03 DIAGNOSIS — K115 Sialolithiasis: Secondary | ICD-10-CM | POA: Diagnosis not present

## 2015-11-03 DIAGNOSIS — K219 Gastro-esophageal reflux disease without esophagitis: Secondary | ICD-10-CM | POA: Diagnosis not present

## 2015-11-03 DIAGNOSIS — J301 Allergic rhinitis due to pollen: Secondary | ICD-10-CM | POA: Diagnosis not present

## 2015-11-03 DIAGNOSIS — J385 Laryngeal spasm: Secondary | ICD-10-CM | POA: Diagnosis not present

## 2015-11-19 ENCOUNTER — Encounter: Payer: Self-pay | Admitting: Family Medicine

## 2015-11-19 ENCOUNTER — Ambulatory Visit (INDEPENDENT_AMBULATORY_CARE_PROVIDER_SITE_OTHER): Payer: Medicare Other | Admitting: Family Medicine

## 2015-11-19 VITALS — BP 116/78 | HR 74 | Resp 16 | Ht 65.0 in | Wt 188.0 lb

## 2015-11-19 DIAGNOSIS — E119 Type 2 diabetes mellitus without complications: Secondary | ICD-10-CM | POA: Diagnosis not present

## 2015-11-19 DIAGNOSIS — I10 Essential (primary) hypertension: Secondary | ICD-10-CM | POA: Diagnosis not present

## 2015-11-19 DIAGNOSIS — R600 Localized edema: Secondary | ICD-10-CM

## 2015-11-19 DIAGNOSIS — E559 Vitamin D deficiency, unspecified: Secondary | ICD-10-CM

## 2015-11-19 DIAGNOSIS — K219 Gastro-esophageal reflux disease without esophagitis: Secondary | ICD-10-CM

## 2015-11-19 DIAGNOSIS — J040 Acute laryngitis: Secondary | ICD-10-CM | POA: Diagnosis not present

## 2015-11-19 DIAGNOSIS — J309 Allergic rhinitis, unspecified: Secondary | ICD-10-CM

## 2015-11-19 DIAGNOSIS — E785 Hyperlipidemia, unspecified: Secondary | ICD-10-CM | POA: Diagnosis not present

## 2015-11-19 MED ORDER — DEXLANSOPRAZOLE 30 MG PO CPDR
30.0000 mg | DELAYED_RELEASE_CAPSULE | Freq: Every day | ORAL | Status: DC
Start: 2015-11-19 — End: 2016-05-15

## 2015-11-19 NOTE — Progress Notes (Signed)
Date:  11/19/2015   Name:  Angela Pope   DOB:  [birth year]   MRN:  [id number]  PCP:  Adline Potter, MD    Chief Complaint: Gastroesophageal Reflux   History of Present Illness:  This is a 75 y.o. female seen for one month f/u. Saw ENT who felt had reflux laryngitis, told her to drink lots of water, not helping, still having sore throat and frequent cough. HCTZ added to lisinopril last visit, BP improved but not helping BLE edema, taking in pm to avoid dizziness. On Xyzal for AR with improvement.   Review of Systems:  Review of Systems  Constitutional: Negative for fever and fatigue.  Respiratory: Negative for shortness of breath and wheezing.   Cardiovascular: Negative for chest pain.  Neurological: Negative for syncope.    Patient Active Problem List   Diagnosis Date Noted  . 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 Bilateral lower extremity edema 01/14/2015  . HTN (hypertension) 01/13/2015  . Diabetes mellitus type 2, controlled, without complications ([place]) [id number]  . Hyperlipidemia 01/13/2015  . Obesity, Class I, BMI 30-34.9 01/13/2015  . Allergic rhinitis 01/13/2015  . Hx of colonic polyps 01/13/2015  . 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 Vitamin D deficiency 01/13/2015  . FH: heart disease 01/13/2015  . FH: stroke 01/13/2015    Prior to Admission medications   Medication Sig Start Date End Date Taking? Authorizing Provider  Cholecalciferol (VITAMIN D) 2000 UNITS CAPS Take 1 capsule (2,000 Units total) by mouth daily. 01/14/15  Yes Adline Potter, MD  glucosamine-chondroitin (MAX GLUCOSAMINE CHONDROITIN) 500-400 MG tablet Take 1 tablet by mouth 2 (two) times daily. 01/13/15  Yes Adline Potter, MD  hydrochlorothiazide (HYDRODIURIL) 25 MG tablet Take 1 tablet (25 mg total) by mouth daily. 10/19/15  Yes Adline Potter, MD  levocetirizine (XYZAL) 5 MG tablet Take 5 mg by mouth every evening.   Yes Historical Provider, MD  lisinopril  (PRINIVIL,ZESTRIL) 40 MG tablet Take 1 tablet (40 mg total) by mouth daily. 02/10/15  Yes Adline Potter, MD  Multiple Vitamin (MULTIVITAMIN) capsule Take 1 capsule by mouth daily. 01/13/15  Yes Adline Potter, MD  olopatadine (PATANOL) 0.1 % ophthalmic solution Place 1 drop into both eyes 2 (two) times daily as needed for allergies. 01/13/15  Yes Adline Potter, MD  simvastatin (ZOCOR) 80 MG tablet Take 0.5 tablets (40 mg total) by mouth daily. 02/10/15  Yes Adline Potter, MD  triamcinolone (NASACORT AQ) 55 MCG/ACT AERO nasal inhaler Place 2 sprays into the nose daily. 01/13/15  Yes Adline Potter, MD  Dexlansoprazole 30 MG capsule Take 1 capsule (30 mg total) by mouth daily. 11/19/15   Adline Potter, MD    Allergies  Allergen Reactions  . Clarithromycin   . Sulfa Antibiotics   . Tetracyclines & Related     Past Surgical History  Procedure Laterality Date  . Small intestine surgery  2012    polyp that could't be removed  . Colonoscopy  2015  . Large intestine surgery      Social History  Substance Use Topics  . Smoking status: Former Smoker    Quit date: 10/18/1965  . Smokeless tobacco: Never Used  . Alcohol Use: No    Family History  Problem Relation Age of Onset  . Heart disease Mother   . Hypertension Mother   . Diabetes Brother   . Heart disease Brother   . Hypertension Brother   . Diabetes Paternal Grandfather  Medication list has been reviewed and updated.  Physical Examination: BP 116/78 mmHg  Pulse 74  Resp 16  Ht 5\' 5"  (1.651 m)  Wt 188 lb (85.276 kg)  BMI 31.28 kg/m2  SpO2 100%  Physical Exam  Constitutional: She appears well-developed and well-nourished.  Cardiovascular: Normal rate, regular rhythm and normal heart sounds.   Pulmonary/Chest: Effort normal and breath sounds normal.  Musculoskeletal:  1+ BLE edema  Neurological: She is alert.  Skin: Skin is warm and dry.  Psychiatric: She has a normal mood and affect. Her behavior is normal.  Nursing  note and vitals reviewed.   Assessment and Plan:  1. Reflux laryngitis Per ENT, trial Dexilant daily x one month only, call with results  2. Controlled type 2 diabetes mellitus without complication, without long-term current use of insulin (HCC) A1c 6.8% in May, continue diet control  3. Essential hypertension Well controlled on lisinopril/HCTZ  4. Hyperlipidemia Well controlled on Zocor  5. Vitamin D deficiency Well controlled on supplement  6. Bilateral lower extremity edema Dependent, taking HCTZ in am may improve  7. AR Improved on Xyzal/Nasacort  Return in about 6 months (around 05/20/2016).  [name]. [facility]  11/19/2015

## 2016-01-24 ENCOUNTER — Other Ambulatory Visit: Payer: Self-pay

## 2016-01-24 DIAGNOSIS — I10 Essential (primary) hypertension: Secondary | ICD-10-CM

## 2016-01-24 MED ORDER — LISINOPRIL 40 MG PO TABS: 40.0000 mg | ORAL_TABLET | Freq: Every day | ORAL | 2 refills | Status: DC

## 2016-01-24 MED ORDER — HYDROCHLOROTHIAZIDE 25 MG PO TABS: 25.0000 mg | ORAL_TABLET | Freq: Every day | ORAL | 2 refills | Status: DC

## 2016-02-07 ENCOUNTER — Other Ambulatory Visit: Payer: Self-pay

## 2016-02-07 DIAGNOSIS — E785 Hyperlipidemia, unspecified: Secondary | ICD-10-CM

## 2016-02-07 MED ORDER — SIMVASTATIN 80 MG PO TABS: 40.0000 mg | ORAL_TABLET | Freq: Every day | ORAL | 3 refills | Status: DC

## 2016-02-14 DIAGNOSIS — L603 Nail dystrophy: Secondary | ICD-10-CM | POA: Diagnosis not present

## 2016-03-10 DIAGNOSIS — E119 Type 2 diabetes mellitus without complications: Secondary | ICD-10-CM | POA: Diagnosis not present

## 2016-03-21 ENCOUNTER — Ambulatory Visit (INDEPENDENT_AMBULATORY_CARE_PROVIDER_SITE_OTHER): Payer: Medicare Other

## 2016-03-21 DIAGNOSIS — Z23 Encounter for immunization: Secondary | ICD-10-CM

## 2016-04-05 ENCOUNTER — Other Ambulatory Visit: Payer: Self-pay

## 2016-04-05 MED ORDER — GLUCOSE BLOOD VI STRP: ORAL_STRIP | 0 refills | Status: DC

## 2016-04-28 ENCOUNTER — Telehealth: Payer: Self-pay

## 2016-05-05 DIAGNOSIS — J301 Allergic rhinitis due to pollen: Secondary | ICD-10-CM | POA: Diagnosis not present

## 2016-05-05 DIAGNOSIS — K219 Gastro-esophageal reflux disease without esophagitis: Secondary | ICD-10-CM | POA: Diagnosis not present

## 2016-05-05 DIAGNOSIS — R05 Cough: Secondary | ICD-10-CM | POA: Diagnosis not present

## 2016-05-09 NOTE — Telephone Encounter (Signed)
None needed

## 2016-05-10 ENCOUNTER — Telehealth: Payer: Self-pay

## 2016-05-10 ENCOUNTER — Other Ambulatory Visit: Payer: Self-pay | Admitting: Family Medicine

## 2016-05-10 DIAGNOSIS — I10 Essential (primary) hypertension: Secondary | ICD-10-CM

## 2016-05-10 MED ORDER — HYDROCHLOROTHIAZIDE 25 MG PO TABS: 25.0000 mg | ORAL_TABLET | Freq: Every day | ORAL | 3 refills | Status: DC

## 2016-05-10 MED ORDER — LISINOPRIL 40 MG PO TABS: 40.0000 mg | ORAL_TABLET | Freq: Every day | ORAL | 3 refills | Status: DC

## 2016-05-10 NOTE — Telephone Encounter (Signed)
Refill Lisinopril and HCTZ last appt in June 2017.

## 2016-05-10 NOTE — Telephone Encounter (Signed)
Done

## 2016-05-10 NOTE — Telephone Encounter (Signed)
Pt called said she needs refills on her Lisinopril and Hydrochlorothiazide pt is using [facility] on [address].

## 2016-05-15 ENCOUNTER — Encounter: Payer: Self-pay | Admitting: Family Medicine

## 2016-05-15 ENCOUNTER — Ambulatory Visit (INDEPENDENT_AMBULATORY_CARE_PROVIDER_SITE_OTHER): Payer: Medicare Other | Admitting: Family Medicine

## 2016-05-15 VITALS — BP 132/84 | HR 83 | Resp 16 | Ht 65.0 in | Wt 188.2 lb

## 2016-05-15 DIAGNOSIS — E785 Hyperlipidemia, unspecified: Secondary | ICD-10-CM

## 2016-05-15 DIAGNOSIS — I10 Essential (primary) hypertension: Secondary | ICD-10-CM | POA: Diagnosis not present

## 2016-05-15 DIAGNOSIS — J309 Allergic rhinitis, unspecified: Secondary | ICD-10-CM

## 2016-05-15 DIAGNOSIS — E669 Obesity, unspecified: Secondary | ICD-10-CM | POA: Diagnosis not present

## 2016-05-15 DIAGNOSIS — R600 Localized edema: Secondary | ICD-10-CM

## 2016-05-15 DIAGNOSIS — E559 Vitamin D deficiency, unspecified: Secondary | ICD-10-CM | POA: Diagnosis not present

## 2016-05-15 DIAGNOSIS — E66811 Obesity, class 1: Secondary | ICD-10-CM

## 2016-05-15 DIAGNOSIS — E119 Type 2 diabetes mellitus without complications: Secondary | ICD-10-CM | POA: Diagnosis not present

## 2016-05-15 NOTE — Progress Notes (Signed)
Date:  05/15/2016   Name:  Angela Pope   DOB:  05/09/1941   MRN:  [id number]  PCP:  Adline Potter, MD    Chief Complaint: Hypertension   History of Present Illness:  This is a 75 y.o. female seen for six month f/u. No new concerns except brothers have afib, she denies sxs. BLE edema about the same. PPI no help for laryngitis, ENT to repeat allergy testing. Sugars ok at home, R hip OA about the same. UTD on flu, pneumo, and tetanus. Taking vit D supp.  Review of Systems:  Review of Systems  Constitutional: Negative for activity change, appetite change, fever and unexpected weight change.  Respiratory: Negative for cough and shortness of breath.   Cardiovascular: Negative for chest pain and palpitations.  Gastrointestinal: Negative for abdominal pain.  Endocrine: Negative for polydipsia and polyuria.  Genitourinary: Negative for difficulty urinating.  Neurological: Negative for dizziness, syncope and light-headedness.    Patient Active Problem List   Diagnosis Date Noted  . 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 Bilateral lower extremity edema 01/14/2015  . HTN (hypertension) 01/13/2015  . Diabetes mellitus type 2, controlled, without complications ([place]) [id number]  . Hyperlipidemia 01/13/2015  . Obesity, Class I, BMI 30-34.9 01/13/2015  . Allergic rhinitis 01/13/2015  . Hx of colonic polyps 01/13/2015  . 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 Vitamin D deficiency 01/13/2015  . FH: heart disease 01/13/2015  . FH: stroke 01/13/2015    Prior to Admission medications   Medication Sig Start Date End Date Taking? Authorizing Provider  Cholecalciferol (VITAMIN D) 2000 UNITS CAPS Take 1 capsule (2,000 Units total) by mouth daily. 01/14/15  Yes Adline Potter, MD  glucosamine-chondroitin (MAX GLUCOSAMINE CHONDROITIN) 500-400 MG tablet Take 1 tablet by mouth 2 (two) times daily. 01/13/15  Yes Adline Potter, MD  glucose blood test strip Use as  instructed 04/05/16  Yes Adline Potter, MD  hydrochlorothiazide (HYDRODIURIL) 25 MG tablet Take 1 tablet (25 mg total) by mouth daily. 05/10/16  Yes Adline Potter, MD  lisinopril (PRINIVIL,ZESTRIL) 40 MG tablet Take 1 tablet (40 mg total) by mouth daily. 05/10/16  Yes Adline Potter, MD  Multiple Vitamin (MULTIVITAMIN) capsule Take 1 capsule by mouth daily. 01/13/15  Yes Adline Potter, MD  olopatadine (PATANOL) 0.1 % ophthalmic solution Place 1 drop into both eyes 2 (two) times daily as needed for allergies. 01/13/15  Yes Adline Potter, MD  simvastatin (ZOCOR) 80 MG tablet Take 0.5 tablets (40 mg total) by mouth daily. 02/07/16  Yes Glean Hess, MD  triamcinolone (NASACORT AQ) 55 MCG/ACT AERO nasal inhaler Place 2 sprays into the nose daily. 01/13/15  Yes Adline Potter, MD    Allergies  Allergen Reactions  . Clarithromycin   . Sulfa Antibiotics   . Tetracyclines & Related     Past Surgical History:  Procedure Laterality Date  . COLONOSCOPY  2015  . large intestine surgery    . SMALL INTESTINE SURGERY  2012   polyp that could't be removed    Social History  Substance Use Topics  . Smoking status: Former Smoker    Quit date: 10/18/1965  . Smokeless tobacco: Never Used  . Alcohol use No    Family History  Problem Relation Age of Onset  . Heart disease Mother   . Hypertension Mother   . Diabetes Brother   . Heart disease Brother   . Hypertension Brother   . Diabetes Paternal Grandfather  Medication list has been reviewed and updated.  Physical Examination: BP 132/84   Pulse 83   Resp 16   Ht 5\' 5"  (1.651 m)   Wt 188 lb 3.2 oz (85.4 kg)   SpO2 95%   BMI 31.32 kg/m   Physical Exam  Constitutional: She appears well-developed and well-nourished.  Cardiovascular: Normal rate, regular rhythm and normal heart sounds.   Pulmonary/Chest: Effort normal and breath sounds normal.  Musculoskeletal:  1+ BLE edema  Neurological: She is alert.  Skin: Skin is warm and dry.   Psychiatric: She has a normal mood and affect. Her behavior is normal.  Nursing note and vitals reviewed.   Assessment and Plan:  1. Controlled type 2 diabetes mellitus without complication, without long-term current use of insulin (HCC) Well controlled at home, last a1c 6.8% - HgB A1c  2. Essential hypertension Well controlled on lisinopril/HCTZ - Comprehensive Metabolic Panel (CMET)  3. Hyperlipidemia, unspecified hyperlipidemia type Well controlled on Zocor - Lipid Profile  4. Chronic allergic rhinitis, unspecified seasonality, unspecified trigger For allergy testing per ENT  5. Vitamin D deficiency On supplemement - Vitamin D (25 hydroxy)  6. Obesity, Class I, BMI 30-34.9 Exercise/weight loss discussed  7. Bilateral lower extremity edema Stable  Return in about 6 months (around 11/13/2016).  [name]. [facility]  05/15/2016

## 2016-05-15 NOTE — Patient Instructions (Signed)
Atrial Fibrillation Atrial fibrillation is a type of irregular or rapid heartbeat (arrhythmia). In atrial fibrillation, the heart quivers continuously in a chaotic pattern. This occurs when parts of the heart receive disorganized signals that make the heart unable to pump blood normally. This can increase the risk for stroke, heart failure, and other heart-related conditions. There are different types of atrial fibrillation, including:  Paroxysmal atrial fibrillation. This type starts suddenly, and it usually stops on its own shortly after it starts.  Persistent atrial fibrillation. This type often lasts longer than a week. It may stop on its own or with treatment.  Long-lasting persistent atrial fibrillation. This type lasts longer than 12 months.  Permanent atrial fibrillation. This type does not go away.  Talk with your health care provider to learn about the type of atrial fibrillation that you have. What are the causes? This condition is caused by some heart-related conditions or procedures, including:  A heart attack.  Coronary artery disease.  Heart failure.  Heart valve conditions.  High blood pressure.  Inflammation of the sac that surrounds the heart (pericarditis).  Heart surgery.  Certain heart rhythm disorders, such as Wolf-Parkinson-White syndrome.  Other causes include:  Pneumonia.  Obstructive sleep apnea.  Blockage of an artery in the lungs (pulmonary embolism, or PE).  Lung cancer.  Chronic lung disease.  Thyroid problems, especially if the thyroid is overactive (hyperthyroidism).  Caffeine.  Excessive alcohol use or illegal drug use.  Use of some medicines, including certain decongestants and diet pills.  Sometimes, the cause cannot be found. What increases the risk? This condition is more likely to develop in:  People who are older in age.  People who smoke.  People who have diabetes mellitus.  People who are overweight  (obese).  Athletes who exercise vigorously.  What are the signs or symptoms? Symptoms of this condition include:  A feeling that your heart is beating rapidly or irregularly.  A feeling of discomfort or pain in your chest.  Shortness of breath.  Sudden light-headedness or weakness.  Getting tired easily during exercise.  In some cases, there are no symptoms. How is this diagnosed? Your health care provider may be able to detect atrial fibrillation when taking your pulse. If detected, this condition may be diagnosed with:  An electrocardiogram (ECG).  A Holter monitor test that records your heartbeat patterns over a 24-hour period.  Transthoracic echocardiogram (TTE) to evaluate how blood flows through your heart.  Transesophageal echocardiogram (TEE) to view more detailed images of your heart.  A stress test.  Imaging tests, such as a CT scan or chest X-ray.  Blood tests.  How is this treated? The main goals of treatment are to prevent blood clots from forming and to keep your heart beating at a normal rate and rhythm. The type of treatment that you receive depends on many factors, such as your underlying medical conditions and how you feel when you are experiencing atrial fibrillation. This condition may be treated with:  Medicine to slow down the heart rate, bring the heart's rhythm back to normal, or prevent clots from forming.  Electrical cardioversion. This is a procedure that resets your heart's rhythm by delivering a controlled, low-energy shock to the heart through your skin.  Different types of ablation, such as catheter ablation, catheter ablation with pacemaker, or surgical ablation. These procedures destroy the heart tissues that send abnormal signals. When the pacemaker is used, it is placed under your skin to help your heart beat in   a regular rhythm.  Follow these instructions at home:  Take over-the counter and prescription medicines only as told by your  health care provider.  If your health care provider prescribed a blood-thinning medicine (anticoagulant), take it exactly as told. Taking too much blood-thinning medicine can cause bleeding. If you do not take enough blood-thinning medicine, you will not have the protection that you need against stroke and other problems.  Do not use tobacco products, including cigarettes, chewing tobacco, and e-cigarettes. If you need help quitting, ask your health care provider.  If you have obstructive sleep apnea, manage your condition as told by your health care provider.  Do not drink alcohol.  Do not drink beverages that contain caffeine, such as coffee, soda, and tea.  Maintain a healthy weight. Do not use diet pills unless your health care provider approves. Diet pills may make heart problems worse.  Follow diet instructions as told by your health care provider.  Exercise regularly as told by your health care provider.  Keep all follow-up visits as told by your health care provider. This is important. How is this prevented?  Avoid drinking beverages that contain caffeine or alcohol.  Avoid certain medicines, especially medicines that are used for breathing problems.  Avoid certain herbs and herbal medicines, such as those that contain ephedra or ginseng.  Do not use illegal drugs, such as cocaine and amphetamines.  Do not smoke.  Manage your high blood pressure. Contact a health care provider if:  You notice a change in the rate, rhythm, or strength of your heartbeat.  You are taking an anticoagulant and you notice increased bruising.  You tire more easily when you exercise or exert yourself. Get help right away if:  You have chest pain, abdominal pain, sweating, or weakness.  You feel nauseous.  You notice blood in your vomit, bowel movement, or urine.  You have shortness of breath.  You suddenly have swollen feet and ankles.  You feel dizzy.  You have sudden weakness or  numbness of the face, arm, or leg, especially on one side of the body.  You have trouble speaking, trouble understanding, or both (aphasia).  Your face or your eyelid droops on one side. These symptoms may represent a serious problem that is an emergency. Do not wait to see if the symptoms will go away. Get medical help right away. Call your local emergency services (911 in the [place] Do not drive yourself to the hospital. This information is not intended to replace advice given to you by your health care provider. Make sure you discuss any questions you have with your health care provider. Document Released: 05/15/2005 Document Revised: 09/22/2015 Document Reviewed: 09/09/2014 Elsevier Interactive Patient Education  2017 [employer]  

## 2016-05-16 ENCOUNTER — Other Ambulatory Visit: Payer: Self-pay | Admitting: Family Medicine

## 2016-05-16 LAB — COMPREHENSIVE METABOLIC PANEL
ALT: 24 IU/L (ref 0–32)
AST: 23 IU/L (ref 0–40)
Albumin/Globulin Ratio: 1.7 (ref 1.2–2.2)
Albumin: 4 g/dL (ref 3.5–4.8)
Alkaline Phosphatase: 68 IU/L (ref 39–117)
BUN/Creatinine Ratio: 14 (ref 12–28)
BUN: 10 mg/dL (ref 8–27)
Bilirubin Total: 1.2 mg/dL (ref 0.0–1.2)
CALCIUM: 9.2 mg/dL (ref 8.7–10.3)
CO2: 27 mmol/L (ref 18–29)
Chloride: 100 mmol/L (ref 96–106)
Creatinine, Ser: 0.69 mg/dL (ref 0.57–1.00)
GFR, EST AFRICAN AMERICAN: 98 mL/min/{1.73_m2} (ref >59)
GFR, EST NON AFRICAN AMERICAN: 85 mL/min/{1.73_m2} (ref >59)
GLUCOSE: 180 mg/dL — AB (ref 65–99)
Globulin, Total: 2.4 g/dL (ref 1.5–4.5)
Potassium: 4 mmol/L (ref 3.5–5.2)
Sodium: 140 mmol/L (ref 134–144)
TOTAL PROTEIN: 6.4 g/dL (ref 6.0–8.5)

## 2016-05-16 LAB — LIPID PANEL
CHOL/HDL RATIO: 3.8 ratio (ref 0.0–4.4)
Cholesterol, Total: 163 mg/dL (ref 100–199)
HDL: 43 mg/dL (ref >39)
LDL CALC: 75 mg/dL (ref 0–99)
TRIGLYCERIDES: 223 mg/dL — AB (ref 0–149)
VLDL Cholesterol Cal: 45 mg/dL — ABNORMAL HIGH (ref 5–40)

## 2016-05-16 LAB — VITAMIN D 25 HYDROXY (VIT D DEFICIENCY, FRACTURES): Vit D, 25-Hydroxy: 28.7 ng/mL — ABNORMAL LOW (ref 30.0–100.0)

## 2016-05-16 LAB — HEMOGLOBIN A1C
ESTIMATED AVERAGE GLUCOSE: 171 mg/dL
Hgb A1c MFr Bld: 7.6 % — ABNORMAL HIGH (ref 4.8–5.6)

## 2016-05-16 MED ORDER — METFORMIN HCL 500 MG PO TABS
500.0000 mg | ORAL_TABLET | Freq: Two times a day (BID) | ORAL | 2 refills | Status: DC
Start: 2016-05-16 — End: 2016-08-22

## 2016-05-16 MED ORDER — VITAMIN D3 125 MCG (5000 UT) PO CAPS: 1.0000 | ORAL_CAPSULE | Freq: Every day | ORAL | Status: DC

## 2016-05-19 ENCOUNTER — Telehealth: Payer: Self-pay

## 2016-05-19 NOTE — Telephone Encounter (Signed)
Pt called and said she has an appt on Monday with Dr Vicente Masson and "has a quick question"- please call back

## 2016-05-19 NOTE — Telephone Encounter (Signed)
Patient asking about urinalysis as she has not had one in over 6 mo I advised we do them here once a year or when having issues. She will do one in June if ok. I advised we have done Micro Albumin.

## 2016-06-05 DIAGNOSIS — L603 Nail dystrophy: Secondary | ICD-10-CM | POA: Diagnosis not present

## 2016-08-22 ENCOUNTER — Other Ambulatory Visit: Payer: Self-pay | Admitting: Family Medicine

## 2016-08-22 MED ORDER — METFORMIN HCL 500 MG PO TABS: 500.0000 mg | ORAL_TABLET | Freq: Two times a day (BID) | ORAL | 3 refills | Status: DC

## 2016-08-29 ENCOUNTER — Ambulatory Visit (INDEPENDENT_AMBULATORY_CARE_PROVIDER_SITE_OTHER): Payer: Medicare Other | Admitting: Family Medicine

## 2016-08-29 ENCOUNTER — Encounter: Payer: Self-pay | Admitting: Family Medicine

## 2016-08-29 VITALS — BP 148/86 | HR 67 | Temp 99.4°F | Resp 16 | Ht 65.0 in | Wt 188.0 lb

## 2016-08-29 DIAGNOSIS — E559 Vitamin D deficiency, unspecified: Secondary | ICD-10-CM | POA: Diagnosis not present

## 2016-08-29 DIAGNOSIS — I10 Essential (primary) hypertension: Secondary | ICD-10-CM | POA: Diagnosis not present

## 2016-08-29 DIAGNOSIS — J0100 Acute maxillary sinusitis, unspecified: Secondary | ICD-10-CM

## 2016-08-29 DIAGNOSIS — E119 Type 2 diabetes mellitus without complications: Secondary | ICD-10-CM | POA: Diagnosis not present

## 2016-08-29 MED ORDER — AMOXICILLIN 875 MG PO TABS: 875.0000 mg | ORAL_TABLET | Freq: Two times a day (BID) | ORAL | 0 refills | Status: DC

## 2016-08-29 NOTE — Patient Instructions (Signed)

## 2016-08-30 LAB — HEMOGLOBIN A1C
Est. average glucose Bld gHb Est-mCnc: 131 mg/dL
HEMOGLOBIN A1C: 6.2 % — AB (ref 4.8–5.6)

## 2016-08-30 LAB — VITAMIN D 25 HYDROXY (VIT D DEFICIENCY, FRACTURES): Vit D, 25-Hydroxy: 47.3 ng/mL (ref 30.0–100.0)

## 2016-08-30 NOTE — Progress Notes (Signed)
Date:  08/29/2016   Name:  Angela Pope   DOB:  01-04-1941   MRN:  [id number]  PCP:  Adline Potter, MD    Chief Complaint: Sinusitis (Cough and sinus pain started Thursday and she has not been well since. Has felt like she had fever but had no way to check. Denies Nausea and Vomiting and has had loose stolls which is common for her. )   History of Present Illness:  This is a 76 y.o. female seen for three month f/u. C/o 5d hx yellow/red nasal d/c and sinus pain, Nyquil/Mucinex no help, sl sore throat, cough occ productive, LG fever today. Metformin started last visit due to elevated a1c, tolerating well, vit D dose dose increased as level low.  Review of Systems:  Review of Systems  Respiratory: Negative for shortness of breath.   Cardiovascular: Negative for chest pain.  Gastrointestinal: Negative for abdominal pain and blood in stool.  Genitourinary: Negative for dysuria.  Neurological: Negative for syncope and light-headedness.    Patient Active Problem List   Diagnosis Date Noted  . 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 Bilateral lower extremity edema 01/14/2015  . HTN (hypertension) 01/13/2015  . Diabetes mellitus type 2, controlled, without complications ([place]) 22/06/5425  . Hyperlipidemia 01/13/2015  . Obesity, Class I, BMI 30-34.9 01/13/2015  . Allergic rhinitis 01/13/2015  . Hx of colonic polyps 01/13/2015  . 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 Vitamin D deficiency 01/13/2015  . FH: heart disease 01/13/2015  . FH: stroke 01/13/2015    Prior to Admission medications   Medication Sig Start Date End Date Taking? Authorizing Provider  aspirin EC 81 MG tablet Take 81 mg by mouth daily.   Yes Historical Provider, MD  Cholecalciferol (VITAMIN D3) 5000 units CAPS Take 1 capsule (5,000 Units total) by mouth daily. 05/16/16  Yes Adline Potter, MD  glucosamine-chondroitin (MAX GLUCOSAMINE CHONDROITIN) 500-400 MG tablet Take 1 tablet by  mouth 2 (two) times daily. 01/13/15  Yes Adline Potter, MD  glucose blood test strip Use as instructed 04/05/16  Yes Adline Potter, MD  guaiFENesin (MUCINEX) 600 MG 12 hr tablet Take by mouth 2 (two) times daily.   Yes Historical Provider, MD  hydrochlorothiazide (HYDRODIURIL) 25 MG tablet Take 1 tablet (25 mg total) by mouth daily. 05/10/16  Yes Adline Potter, MD  lisinopril (PRINIVIL,ZESTRIL) 40 MG tablet Take 1 tablet (40 mg total) by mouth daily. 05/10/16  Yes Adline Potter, MD  metFORMIN (GLUCOPHAGE) 500 MG tablet Take 1 tablet (500 mg total) by mouth 2 (two) times daily with a meal. 08/22/16  Yes Adline Potter, MD  Multiple Vitamin (MULTIVITAMIN) capsule Take 1 capsule by mouth daily. 01/13/15  Yes Adline Potter, MD  olopatadine (PATANOL) 0.1 % ophthalmic solution Place 1 drop into both eyes 2 (two) times daily as needed for allergies. 01/13/15  Yes Adline Potter, MD  Pseudoeph-Doxylamine-DM-APAP (NYQUIL PO) Take by mouth.   Yes Historical Provider, MD  simvastatin (ZOCOR) 80 MG tablet Take 0.5 tablets (40 mg total) by mouth daily. 02/07/16  Yes Glean Hess, MD  triamcinolone (NASACORT AQ) 55 MCG/ACT AERO nasal inhaler Place 2 sprays into the nose daily. 01/13/15  Yes Adline Potter, MD  amoxicillin (AMOXIL) 875 MG tablet Take 1 tablet (875 mg total) by mouth 2 (two) times daily. 08/29/16   Adline Potter, MD    Allergies  Allergen Reactions  . Clarithromycin   . Sulfa Antibiotics   . Tetracyclines &  Related     Past Surgical History:  Procedure Laterality Date  . COLONOSCOPY  2015  . large intestine surgery    . SMALL INTESTINE SURGERY  2012   polyp that could't be removed    Social History  Substance Use Topics  . Smoking status: Former Smoker    Quit date: 10/18/1965  . Smokeless tobacco: Never Used  . Alcohol use No    Family History  Problem Relation Age of Onset  . Heart disease Mother   . Hypertension Mother   . Diabetes Brother   . Heart disease Brother   .  Hypertension Brother   . Diabetes Paternal Grandfather     Medication list has been reviewed and updated.  Physical Examination: BP (!) 148/86   Pulse 67   Temp 99.4 F (37.4 C)   Resp 16   Ht 5\' 5"  (1.651 m)   Wt 188 lb (85.3 kg)   SpO2 94%   BMI 31.28 kg/m   Physical Exam  Constitutional: She appears well-developed and well-nourished. No distress.  HENT:  Nose: Nose normal.  Mouth/Throat: Oropharynx is clear and moist.  Marked B max sinus tenderness  Neck: Neck supple.  Cardiovascular: Normal rate, regular rhythm and normal heart sounds.   Pulmonary/Chest: Effort normal and breath sounds normal.  Lymphadenopathy:    She has no cervical adenopathy.  Neurological: She is alert.  Skin: Skin is warm and dry. She is not diaphoretic.  Psychiatric: She has a normal mood and affect. Her behavior is normal.  Nursing note and vitals reviewed.   Assessment and Plan:  1. Acute non-recurrent maxillary sinusitis Amox 875 mg bid x 10d, call if sxs worsen/persist  2. Essential hypertension Elevated today due to acute illness  3. Controlled type 2 diabetes mellitus without complication, without long-term current use of insulin ([place]) On metformin since last visit - HgB A1c  4. Vitamin D deficiency On supplement - Vitamin D (25 hydroxy)  Return in about 3 months (around 11/28/2016).  [name]. [facility]  08/30/2016

## 2016-10-30 ENCOUNTER — Ambulatory Visit (INDEPENDENT_AMBULATORY_CARE_PROVIDER_SITE_OTHER): Payer: Medicare Other

## 2016-10-30 VITALS — BP 122/80 | HR 66 | Temp 98.6°F | Resp 16 | Ht 65.0 in | Wt 171.2 lb

## 2016-10-30 DIAGNOSIS — Z0000 Encounter for general adult medical examination without abnormal findings: Secondary | ICD-10-CM | POA: Diagnosis not present

## 2016-10-30 NOTE — Progress Notes (Signed)
Subjective:   Angela Pope is a 76 y.o. female who presents for Medicare Annual (Subsequent) preventive examination.  Review of Systems:   Cardiac Risk Factors include: advanced age (>58men, >41 women);hypertension;dyslipidemia;diabetes mellitus     Objective:     Vitals: BP 122/80 (BP Location: Right Arm, Patient Position: Sitting)   Pulse 66   Temp 98.6 F (37 C)   Resp 16   Ht 5\' 5"  (1.651 m)   Wt 171 lb 3.2 oz (77.7 kg)   BMI 28.49 kg/m   Body mass index is 28.49 kg/m.   Tobacco History  Smoking Status  . Former Smoker  . Quit date: 10/18/1965  Smokeless Tobacco  . Never Used     Counseling given: Not Answered   Past Medical History:  Diagnosis Date  . Diabetes mellitus without complication ([place])    controls with diet  . Hyperlipidemia   . Hypertension    Past Surgical History:  Procedure Laterality Date  . COLONOSCOPY  2015  . large intestine surgery    . SMALL INTESTINE SURGERY  2012   polyp that could't be removed   Family History  Problem Relation Age of Onset  . Heart disease Mother   . Hypertension Mother   . Diabetes Brother   . Heart disease Brother   . Hypertension Brother   . Diabetes Paternal Grandfather   . Stroke Father   . Prostate cancer Brother   . Hypertension Brother    History  Sexual Activity  . Sexual activity: No    Outpatient Encounter Prescriptions as of 10/30/2016  Medication Sig  . Cholecalciferol (VITAMIN D3) 5000 units CAPS Take 1 capsule (5,000 Units total) by mouth daily.  Marland Kitchen glucosamine-chondroitin (MAX GLUCOSAMINE CHONDROITIN) 500-400 MG tablet Take 1 tablet by mouth 2 (two) times daily.  Marland Kitchen glucose blood test strip Use as instructed  . hydrochlorothiazide (HYDRODIURIL) 25 MG tablet Take 1 tablet (25 mg total) by mouth daily.  Marland Kitchen lisinopril (PRINIVIL,ZESTRIL) 40 MG tablet Take 1 tablet (40 mg total) by mouth daily.  Marland Kitchen loperamide (IMODIUM) 2 MG capsule Take 2 mg by mouth as needed for diarrhea or loose stools.    . metFORMIN (GLUCOPHAGE) 500 MG tablet Take 1 tablet (500 mg total) by mouth 2 (two) times daily with a meal.  . Multiple Vitamin (MULTIVITAMIN) capsule Take 1 capsule by mouth daily.  . simvastatin (ZOCOR) 80 MG tablet Take 0.5 tablets (40 mg total) by mouth daily.  Marland Kitchen triamcinolone (NASACORT AQ) 55 MCG/ACT AERO nasal inhaler Place 2 sprays into the nose daily.  Marland Kitchen aspirin EC 81 MG tablet Take 81 mg by mouth daily.  Marland Kitchen guaiFENesin (MUCINEX) 600 MG 12 hr tablet Take by mouth 2 (two) times daily.  Marland Kitchen olopatadine (PATANOL) 0.1 % ophthalmic solution Place 1 drop into both eyes 2 (two) times daily as needed for allergies. (Patient not taking: Reported on 10/30/2016)  . Pseudoeph-Doxylamine-DM-APAP (NYQUIL PO) Take by mouth.  . [DISCONTINUED] amoxicillin (AMOXIL) 875 MG tablet Take 1 tablet (875 mg total) by mouth 2 (two) times daily.   No facility-administered encounter medications on file as of 10/30/2016.     Activities of Daily Living In your present state of health, do you have any difficulty performing the following activities: 10/30/2016  Hearing? N  Vision? N  Difficulty concentrating or making decisions? N  Walking or climbing stairs? N  Dressing or bathing? N  Doing errands, shopping? N  Preparing Food and eating ? N  Using the Toilet? N  In the past six months, have you accidently leaked urine? N  Do you have problems with loss of bowel control? Y  Managing your Medications? N  Managing your Finances? N  Housekeeping or managing your Housekeeping? N  Some recent data might be hidden    Patient Care Team: Adline Potter, MD as PCP - General (Family Medicine)    Assessment:     Exercise Activities and Dietary recommendations Current Exercise Habits: The patient does not participate in regular exercise at present  Goals    . Have 3 meals a day          Recommend eating 3 health meals a day.      Fall Risk Fall Risk  10/30/2016 10/19/2015 01/13/2015  Falls in the past year? Yes  Yes No  Number falls in past yr: 1 1 -  Injury with Fall? No - -  Risk for fall due to : - Impaired balance/gait -   Depression Screen PHQ 2/9 Scores 10/30/2016 10/19/2015 01/13/2015  PHQ - 2 Score 0 0 1     Cognitive Function     6CIT Screen 10/30/2016  What Year? 0 points  What month? 0 points  What time? 0 points  Count back from 20 0 points  Months in reverse 0 points  Repeat phrase 2 points  Total Score 2    Immunization History  Administered Date(s) Administered  . Influenza,inj,Quad PF,36+ Mos 03/10/2015, 03/21/2016   Screening Tests Health Maintenance  Topic Date Due  . FOOT EXAM  11/12/2016 (Originally 03/11/1951)  . INFLUENZA VACCINE  12/27/2016  . HEMOGLOBIN A1C  02/28/2017  . OPHTHALMOLOGY EXAM  03/16/2017  . TETANUS/TDAP  05/29/2020  . COLONOSCOPY  05/30/2023  . DEXA SCAN  Completed  . PNA vac Low Risk Adult  Completed      Plan:    I have personally reviewed and addressed the Medicare Annual Wellness questionnaire and have noted the following in the patient's chart:  A. Medical and social history B. Use of alcohol, tobacco or illicit drugs  C. Current medications and supplements D. Functional ability and status E.  Nutritional status F.  Physical activity G. Advance directives H. List of other physicians I.  Hospitalizations, surgeries, and ER visits in previous 12 months J.  [place] such as hearing and vision if needed, cognitive and depression L. Referrals and appointments - 11/13/2016 at 10am with Dr.Plonk  In addition, I have reviewed and discussed with patient certain preventive protocols, quality metrics, and best practice recommendations. A written personalized care plan for preventive services as well as general preventive health recommendations were provided to patient.   Signed,  Tyler Aas, LPN Nurse Health Advisor    MD Recommendations: Patient concerned about side effects of simvastatin. Needs diabetic foot exam.

## 2016-10-30 NOTE — Patient Instructions (Signed)
Angela Pope , Thank you for taking time to come for your Medicare Wellness Visit. I appreciate your ongoing commitment to your health goals. Please review the following plan we discussed and let me know if I can assist you in the future.   Screening recommendations/referrals: Colonoscopy: no longer required Mammogram: no longer required  Bone Density: done 10 years ago  Recommended yearly ophthalmology/optometry visit for glaucoma screening and checkup Recommended yearly dental visit for hygiene and checkup  Vaccinations: Influenza vaccine: up to date, due 02/2017 Pneumococcal vaccine: up to date  Tdap vaccine: up to date Shingles vaccine: up to date   Advanced directives: Please bring a copy of your health care power of attorney at your convenience.   Conditions/risks identified: Recommend eating 3 health meals a day.  Next appointment: Follow up with Dr.Plonk on 11/13/2016 at 10:00am. Follow up in one year for your annual wellness exam.    Preventive Care 65 Years and Older, Female Preventive care refers to lifestyle choices and visits with your health care provider that can promote health and wellness. What does preventive care include?  A yearly physical exam. This is also called an annual well check.  Dental exams once or twice a year.  Routine eye exams. Ask your health care provider how often you should have your eyes checked.  Personal lifestyle choices, including:  Daily care of your teeth and gums.  Regular physical activity.  Eating a healthy diet.  Avoiding tobacco and drug use.  Limiting alcohol use.  Practicing safe sex.  Taking low-dose aspirin every day.  Taking vitamin and mineral supplements as recommended by your health care provider. What happens during an annual well check? The services and screenings done by your health care provider during your annual well check will depend on your age, overall health, lifestyle risk factors, and family history  of disease. Counseling  Your health care provider may ask you questions about your:  Alcohol use.  Tobacco use.  Drug use.  Emotional well-being.  Home and relationship well-being.  Sexual activity.  Eating habits.  History of falls.  Memory and ability to understand (cognition).  Work and work Statistician.  Reproductive health. Screening  You may have the following tests or measurements:  Height, weight, and BMI.  Blood pressure.  Lipid and cholesterol levels. These may be checked every 5 years, or more frequently if you are over 73 years old.  Skin check.  Lung cancer screening. You may have this screening every year starting at age 44 if you have a 30-pack-year history of smoking and currently smoke or have quit within the past 15 years.  Fecal occult blood test (FOBT) of the stool. You may have this test every year starting at age 50.  Flexible sigmoidoscopy or colonoscopy. You may have a sigmoidoscopy every 5 years or a colonoscopy every 10 years starting at age 46.  Hepatitis C blood test.  Hepatitis B blood test.  Sexually transmitted disease (STD) testing.  Diabetes screening. This is done by checking your blood sugar (glucose) after you have not eaten for a while (fasting). You may have this done every 1-3 years.  Bone density scan. This is done to screen for osteoporosis. You may have this done starting at age 29.  Mammogram. This may be done every 1-2 years. Talk to your health care provider about how often you should have regular mammograms. Talk with your health care provider about your test results, treatment options, and if necessary, the need for  more tests. Vaccines  Your health care provider may recommend certain vaccines, such as:  Influenza vaccine. This is recommended every year.  Tetanus, diphtheria, and acellular pertussis (Tdap, Td) vaccine. You may need a Td booster every 10 years.  Zoster vaccine. You may need this after age  77.  Pneumococcal 13-valent conjugate (PCV13) vaccine. One dose is recommended after age 60.  Pneumococcal polysaccharide (PPSV23) vaccine. One dose is recommended after age 45. Talk to your health care provider about which screenings and vaccines you need and how often you need them. This information is not intended to replace advice given to you by your health care provider. Make sure you discuss any questions you have with your health care provider. Document Released: 06/11/2015 Document Revised: 02/02/2016 Document Reviewed: 03/16/2015 Elsevier Interactive Patient Education  2017 [employer] Prevention in the Home Falls can cause injuries. They can happen to people of all ages. There are many things you can do to make your home safe and to help prevent falls. What can I do on the outside of my home?  Regularly fix the edges of walkways and driveways and fix any cracks.  Remove anything that might make you trip as you walk through a door, such as a raised step or threshold.  Trim any bushes or trees on the path to your home.  Use bright outdoor lighting.  Clear any walking paths of anything that might make someone trip, such as rocks or tools.  Regularly check to see if handrails are loose or broken. Make sure that both sides of any steps have handrails.  Any raised decks and porches should have guardrails on the edges.  Have any leaves, snow, or ice cleared regularly.  Use sand or salt on walking paths during winter.  Clean up any spills in your garage right away. This includes oil or grease spills. What can I do in the bathroom?  Use night lights.  Install grab bars by the toilet and in the tub and shower. Do not use towel bars as grab bars.  Use non-skid mats or decals in the tub or shower.  If you need to sit down in the shower, use a plastic, non-slip stool.  Keep the floor dry. Clean up any water that spills on the floor as soon as it happens.  Remove  soap buildup in the tub or shower regularly.  Attach bath mats securely with double-sided non-slip rug tape.  Do not have throw rugs and other things on the floor that can make you trip. What can I do in the bedroom?  Use night lights.  Make sure that you have a light by your bed that is easy to reach.  Do not use any sheets or blankets that are too big for your bed. They should not hang down onto the floor.  Have a firm chair that has side arms. You can use this for support while you get dressed.  Do not have throw rugs and other things on the floor that can make you trip. What can I do in the kitchen?  Clean up any spills right away.  Avoid walking on wet floors.  Keep items that you use a lot in easy-to-reach places.  If you need to reach something above you, use a strong step stool that has a grab bar.  Keep electrical cords out of the way.  Do not use floor polish or wax that makes floors slippery. If you must use wax, use non-skid  floor wax.  Do not have throw rugs and other things on the floor that can make you trip. What can I do with my stairs?  Do not leave any items on the stairs.  Make sure that there are handrails on both sides of the stairs and use them. Fix handrails that are broken or loose. Make sure that handrails are as long as the stairways.  Check any carpeting to make sure that it is firmly attached to the stairs. Fix any carpet that is loose or worn.  Avoid having throw rugs at the top or bottom of the stairs. If you do have throw rugs, attach them to the floor with carpet tape.  Make sure that you have a light switch at the top of the stairs and the bottom of the stairs. If you do not have them, ask someone to add them for you. What else can I do to help prevent falls?  Wear shoes that:  Do not have high heels.  Have rubber bottoms.  Are comfortable and fit you well.  Are closed at the toe. Do not wear sandals.  If you use a  stepladder:  Make sure that it is fully opened. Do not climb a closed stepladder.  Make sure that both sides of the stepladder are locked into place.  Ask someone to hold it for you, if possible.  Clearly mark and make sure that you can see:  Any grab bars or handrails.  First and last steps.  Where the edge of each step is.  Use tools that help you move around (mobility aids) if they are needed. These include:  Canes.  Walkers.  Scooters.  Crutches.  Turn on the lights when you go into a dark area. Replace any light bulbs as soon as they burn out.  Set up your furniture so you have a clear path. Avoid moving your furniture around.  If any of your floors are uneven, fix them.  If there are any pets around you, be aware of where they are.  Review your medicines with your doctor. Some medicines can make you feel dizzy. This can increase your chance of falling. Ask your doctor what other things that you can do to help prevent falls. This information is not intended to replace advice given to you by your health care provider. Make sure you discuss any questions you have with your health care provider. Document Released: 03/11/2009 Document Revised: 10/21/2015 Document Reviewed: 06/19/2014 Elsevier Interactive Patient Education  2017 [employer].

## 2016-11-13 ENCOUNTER — Ambulatory Visit: Payer: Medicare Other | Admitting: Family Medicine

## 2016-11-13 ENCOUNTER — Encounter: Payer: Self-pay | Admitting: Family Medicine

## 2016-11-13 ENCOUNTER — Ambulatory Visit (INDEPENDENT_AMBULATORY_CARE_PROVIDER_SITE_OTHER): Payer: Medicare Other | Admitting: Family Medicine

## 2016-11-13 VITALS — BP 134/82 | HR 82 | Resp 16 | Ht 65.0 in | Wt 169.2 lb

## 2016-11-13 DIAGNOSIS — E559 Vitamin D deficiency, unspecified: Secondary | ICD-10-CM

## 2016-11-13 DIAGNOSIS — E785 Hyperlipidemia, unspecified: Secondary | ICD-10-CM | POA: Diagnosis not present

## 2016-11-13 DIAGNOSIS — E663 Overweight: Secondary | ICD-10-CM

## 2016-11-13 DIAGNOSIS — I10 Essential (primary) hypertension: Secondary | ICD-10-CM

## 2016-11-13 DIAGNOSIS — E119 Type 2 diabetes mellitus without complications: Secondary | ICD-10-CM | POA: Diagnosis not present

## 2016-11-13 DIAGNOSIS — Z9049 Acquired absence of other specified parts of digestive tract: Secondary | ICD-10-CM

## 2016-11-13 NOTE — Patient Instructions (Signed)
Diverticulosis  Diverticulosis is a condition that develops when small pouches (diverticula) form in the wall of the large intestine (colon). The colon is where water is absorbed and stool is formed. The pouches form when the inside layer of the colon pushes through weak spots in the outer layers of the colon. You may have a few pouches or many of them.  What are the causes?  The cause of this condition is not known.  What increases the risk?  The following factors may make you more likely to develop this condition:   Being older than age 60. Your risk for this condition increases with age. Diverticulosis is rare among people younger than age 30. By age 80, many people have it.   Eating a low-fiber diet.   Having frequent constipation.   Being overweight.   Not getting enough exercise.   Smoking.   Taking over-the-counter pain medicines, like aspirin and ibuprofen.   Having a family history of diverticulosis.    What are the signs or symptoms?  In most people, there are no symptoms of this condition. If you do have symptoms, they may include:   Bloating.   Cramps in the abdomen.   Constipation or diarrhea.   Pain in the lower left side of the abdomen.    How is this diagnosed?  This condition is most often diagnosed during an exam for other colon problems. Because diverticulosis usually has no symptoms, it often cannot be diagnosed independently. This condition may be diagnosed by:   Using a flexible scope to examine the colon (colonoscopy).   Taking an X-ray of the colon after dye has been put into the colon (barium enema).   Doing a CT scan.    How is this treated?  You may not need treatment for this condition if you have never developed an infection related to diverticulosis. If you have had an infection before, treatment may include:   Eating a high-fiber diet. This may include eating more fruits, vegetables, and grains.   Taking a fiber supplement.   Taking a live bacteria supplement  (probiotic).   Taking medicine to relax your colon.   Taking antibiotic medicines.    Follow these instructions at home:   Drink 6-8 glasses of water or more each day to prevent constipation.   Try not to strain when you have a bowel movement.   If you have had an infection before:  ? Eat more fiber as directed by your health care provider or your diet and nutrition specialist (dietitian).  ? Take a fiber supplement or probiotic, if your health care provider approves.   Take over-the-counter and prescription medicines only as told by your health care provider.   If you were prescribed an antibiotic, take it as told by your health care provider. Do not stop taking the antibiotic even if you start to feel better.   Keep all follow-up visits as told by your health care provider. This is important.  Contact a health care provider if:   You have pain in your abdomen.   You have bloating.   You have cramps.   You have not had a bowel movement in 3 days.  Get help right away if:   Your pain gets worse.   Your bloating becomes very bad.   You have a fever or chills, and your symptoms suddenly get worse.   You vomit.   You have bowel movements that are bloody or black.   You have   bleeding from your rectum.  Summary   Diverticulosis is a condition that develops when small pouches (diverticula) form in the wall of the large intestine (colon).   You may have a few pouches or many of them.   This condition is most often diagnosed during an exam for other colon problems.   If you have had an infection related to diverticulosis, treatment may include increasing the fiber in your diet, taking supplements, or taking medicines.  This information is not intended to replace advice given to you by your health care provider. Make sure you discuss any questions you have with your health care provider.  Document Released: 02/10/2004 Document Revised: 04/03/2016 Document Reviewed: 04/03/2016  Elsevier Interactive  Patient Education  2017 [employer]

## 2016-11-14 NOTE — Progress Notes (Signed)
Date:  11/13/2016   Name:  Dezi Brauner   DOB:  28-Dec-1940   MRN:  [id number]  PCP:  Adline Potter, MD    Chief Complaint: Diabetes (6 mo f/u - changed diet and has lost over 15 pounds  )   History of Present Illness:  This is a 76 y.o. female seen for 2.5 month f/u. Sinus sxs resolved with abx. Has lost 18# on new diet. Last a1c 6.2%, vit D level normal in April. Home BG 135 today. C/o intermittent diarrhea, taking loperamide daily past two months, having daily BMs now but concerned about bowels, Gi told she was too old for colonoscopy.  Review of Systems:  Review of Systems  Constitutional: Negative for chills and fever.  Respiratory: Negative for cough and shortness of breath.   Cardiovascular: Negative for chest pain and leg swelling.  Endocrine: Negative for polydipsia and polyuria.  Genitourinary: Negative for difficulty urinating.  Neurological: Negative for syncope and light-headedness.    Patient Active Problem List   Diagnosis Date Noted  . 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 Bilateral lower extremity edema 01/14/2015  . HTN (hypertension) 01/13/2015  . Diabetes mellitus type 2, controlled, without complications ([place]) 23/55/7322  . Hyperlipidemia 01/13/2015  . Overweight (BMI 25.0-29.9) 01/13/2015  . Allergic rhinitis 01/13/2015  . Hx of colonic polyps 01/13/2015  . 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 Vitamin D deficiency 01/13/2015  . FH: heart disease 01/13/2015  . FH: stroke 01/13/2015    Prior to Admission medications   Medication Sig Start Date End Date Taking? Authorizing Provider  aspirin EC 81 MG tablet Take 81 mg by mouth daily.   Yes [provider]  Cholecalciferol (VITAMIN D3) 5000 units CAPS Take 1 capsule (5,000 Units total) by mouth daily. 05/16/16  Yes Thayer Inabinet, Gwyndolyn Saxon, MD  glucosamine-chondroitin (MAX GLUCOSAMINE CHONDROITIN) 500-400 MG tablet Take 1 tablet by mouth 2 (two) times daily. 01/13/15   Yes Jessicia Napolitano, Gwyndolyn Saxon, MD  glucose blood test strip Use as instructed 04/05/16  Yes Kyanne Rials, Gwyndolyn Saxon, MD  hydrochlorothiazide (HYDRODIURIL) 25 MG tablet Take 1 tablet (25 mg total) by mouth daily. 05/10/16  Yes Kealy Lewter, Gwyndolyn Saxon, MD  lisinopril (PRINIVIL,ZESTRIL) 40 MG tablet Take 1 tablet (40 mg total) by mouth daily. 05/10/16  Yes Lucylle Foulkes, Gwyndolyn Saxon, MD  loperamide (IMODIUM) 2 MG capsule Take 2 mg by mouth as needed for diarrhea or loose stools.   Yes [provider]  metFORMIN (GLUCOPHAGE) 500 MG tablet Take 1 tablet (500 mg total) by mouth 2 (two) times daily with a meal. 08/22/16  Yes Keenan Dimitrov, Gwyndolyn Saxon, MD  Multiple Vitamin (MULTIVITAMIN) capsule Take 1 capsule by mouth daily. 01/13/15  Yes Samirah Scarpati, Gwyndolyn Saxon, MD  olopatadine (PATANOL) 0.1 % ophthalmic solution Place 1 drop into both eyes 2 (two) times daily as needed for allergies. 01/13/15  Yes Samari Bittinger, Gwyndolyn Saxon, MD  simvastatin (ZOCOR) 80 MG tablet Take 0.5 tablets (40 mg total) by mouth daily. 02/07/16  Yes Glean Hess, MD  triamcinolone (NASACORT AQ) 55 MCG/ACT AERO nasal inhaler Place 2 sprays into the nose daily. 01/13/15  Yes Madelyn Tlatelpa, Gwyndolyn Saxon, MD    Allergies  Allergen Reactions  . Clarithromycin   . Sulfa Antibiotics   . Tetracyclines & Related     Past Surgical History:  Procedure Laterality Date  . COLONOSCOPY  2015  . large intestine surgery    . SMALL INTESTINE SURGERY  2012   polyp that could't be removed    Social  History  Substance Use Topics  . Smoking status: Former Smoker    Quit date: 10/18/1965  . Smokeless tobacco: Never Used  . Alcohol use 0.6 oz/week    1 Glasses of wine per week    Family History  Problem Relation Age of Onset  . Heart disease Mother   . Hypertension Mother   . Diabetes Brother   . Heart disease Brother   . Hypertension Brother   . Diabetes Paternal Grandfather   . Stroke Father   . Prostate cancer Brother   . Hypertension Brother     Medication list has been reviewed and  updated.  Physical Examination: BP 134/82   Pulse 82   Resp 16   Ht 5\' 5"  (1.651 m)   Wt 169 lb 3.2 oz (76.7 kg)   SpO2 96%   BMI 28.16 kg/m   Physical Exam  Constitutional: She appears well-developed and well-nourished.  Cardiovascular: Normal rate, regular rhythm, normal heart sounds and intact distal pulses.   Pulmonary/Chest: Effort normal and breath sounds normal.  Musculoskeletal:  Feet without lesions, sensation intact per monofilament testing  Neurological: She is alert.  Skin: Skin is warm and dry.  Psychiatric: She has a normal mood and affect. Her behavior is normal.  Nursing note and vitals reviewed.   Assessment and Plan:  1. Controlled type 2 diabetes mellitus without complication, without long-term current use of insulin (HCC) Continue metformin for now, recheck a1c/MCR next month - HgB A1c; Future - Urine Microalbumin w/creat. ratio; Future  2. Essential hypertension Well controlled on lisinopril/HCTZ  3. Hyperlipidemia, unspecified hyperlipidemia type Well controlled on Zocor (LDL 75 in Dec)  4. Vitamin D deficiency Well controlled on supplement  5. Overweight (BMI 25.0-29.9) Continue weight loss  6. Hx of resection of large bowel Discussed screening vs. diagnostic colonoscopy, diverticulosis info given, offered referral to another GI for second opinion, pt will consider  Return in about 4 months (around 03/15/2017).  [name]. [facility]  11/14/2016

## 2016-12-13 ENCOUNTER — Other Ambulatory Visit: Payer: Self-pay

## 2016-12-13 DIAGNOSIS — E119 Type 2 diabetes mellitus without complications: Secondary | ICD-10-CM | POA: Diagnosis not present

## 2016-12-14 LAB — HEMOGLOBIN A1C
Est. average glucose Bld gHb Est-mCnc: 117 mg/dL
HEMOGLOBIN A1C: 5.7 % — AB (ref 4.8–5.6)

## 2016-12-16 LAB — MICROALBUMIN / CREATININE URINE RATIO
Creatinine, Urine: 192 mg/dL
Microalb/Creat Ratio: 21.1 mg/g{creat} (ref 0.0–30.0)
Microalbumin, Urine: 40.6 ug/mL

## 2016-12-18 ENCOUNTER — Other Ambulatory Visit: Payer: Self-pay

## 2016-12-18 ENCOUNTER — Other Ambulatory Visit: Payer: Self-pay | Admitting: Family Medicine

## 2016-12-20 ENCOUNTER — Telehealth: Payer: Self-pay

## 2016-12-20 NOTE — Telephone Encounter (Signed)
Labs

## 2017-02-04 ENCOUNTER — Other Ambulatory Visit: Payer: Self-pay | Admitting: Internal Medicine

## 2017-02-04 DIAGNOSIS — E785 Hyperlipidemia, unspecified: Secondary | ICD-10-CM

## 2017-03-02 ENCOUNTER — Other Ambulatory Visit: Payer: Self-pay

## 2017-03-02 DIAGNOSIS — I10 Essential (primary) hypertension: Secondary | ICD-10-CM

## 2017-03-02 MED ORDER — HYDROCHLOROTHIAZIDE 25 MG PO TABS: 25.0000 mg | ORAL_TABLET | Freq: Every day | ORAL | 0 refills | Status: DC

## 2017-03-02 MED ORDER — LISINOPRIL 40 MG PO TABS: 40.0000 mg | ORAL_TABLET | Freq: Every day | ORAL | 3 refills | Status: DC

## 2017-03-15 ENCOUNTER — Ambulatory Visit (INDEPENDENT_AMBULATORY_CARE_PROVIDER_SITE_OTHER): Payer: Medicare Other | Admitting: Family Medicine

## 2017-03-15 ENCOUNTER — Encounter: Payer: Self-pay | Admitting: Family Medicine

## 2017-03-15 VITALS — BP 122/80 | HR 77 | Resp 16 | Ht 65.0 in | Wt 164.0 lb

## 2017-03-15 DIAGNOSIS — Z23 Encounter for immunization: Secondary | ICD-10-CM

## 2017-03-15 DIAGNOSIS — I10 Essential (primary) hypertension: Secondary | ICD-10-CM | POA: Diagnosis not present

## 2017-03-15 DIAGNOSIS — E559 Vitamin D deficiency, unspecified: Secondary | ICD-10-CM

## 2017-03-15 DIAGNOSIS — E663 Overweight: Secondary | ICD-10-CM | POA: Diagnosis not present

## 2017-03-15 DIAGNOSIS — J309 Allergic rhinitis, unspecified: Secondary | ICD-10-CM

## 2017-03-15 DIAGNOSIS — R600 Localized edema: Secondary | ICD-10-CM | POA: Diagnosis not present

## 2017-03-15 DIAGNOSIS — E785 Hyperlipidemia, unspecified: Secondary | ICD-10-CM

## 2017-03-15 DIAGNOSIS — Z9049 Acquired absence of other specified parts of digestive tract: Secondary | ICD-10-CM

## 2017-03-15 DIAGNOSIS — E119 Type 2 diabetes mellitus without complications: Secondary | ICD-10-CM

## 2017-03-15 MED ORDER — HYDROCHLOROTHIAZIDE 25 MG PO TABS: 50.0000 mg | ORAL_TABLET | Freq: Every day | ORAL | 0 refills | Status: DC

## 2017-03-16 DIAGNOSIS — E119 Type 2 diabetes mellitus without complications: Secondary | ICD-10-CM | POA: Diagnosis not present

## 2017-03-16 LAB — HM DIABETES EYE EXAM

## 2017-03-16 NOTE — Progress Notes (Signed)
Date:  03/15/2017   Name:  Angela Pope   DOB:  03/15/1941   MRN:  [id number]  PCP:  Adline Potter, MD    Chief Complaint: Diabetes (BS ranges 100-140  wants it to stay 110 or lower due to diet she is doing. )   History of Present Illness:  This is a 76 y.o. female seen for four month f/u. T2DM off metformin for a1c 5.7%, am glucoses now in 130's, felt better when they were lower. Sees optho tomorrow. HTN well controlled on lisinopril/HCTZ but c/o increased BLE edema, improves only slightly overnight. Weight down 5# on diet. On Zocor and vit D supp. Wants to do blood work next week.  Review of Systems:  Review of Systems  Constitutional: Negative for chills and fever.  Respiratory: Negative for cough and shortness of breath.   Cardiovascular: Negative for chest pain and leg swelling.  Genitourinary: Negative for difficulty urinating.  Neurological: Negative for syncope and light-headedness.    Patient Active Problem List   Diagnosis Date Noted  . 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 Bilateral lower extremity edema 01/14/2015  . HTN (hypertension) 01/13/2015  . Diabetes mellitus type 2, controlled, without complications ([place]) 71/69/6789  . Hyperlipidemia 01/13/2015  . Overweight (BMI 25.0-29.9) 01/13/2015  . Allergic rhinitis 01/13/2015  . Hx of colonic polyps 01/13/2015  . 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 Vitamin D deficiency 01/13/2015  . FH: heart disease 01/13/2015  . FH: stroke 01/13/2015    Prior to Admission medications   Medication Sig Start Date End Date Taking? Authorizing Provider  Cholecalciferol (VITAMIN D3) 5000 units CAPS Take 1 capsule (5,000 Units total) by mouth daily. 05/16/16  Yes Carly Applegate, Gwyndolyn Saxon, MD  glucosamine-chondroitin (MAX GLUCOSAMINE CHONDROITIN) 500-400 MG tablet Take 1 tablet by mouth 2 (two) times daily. 01/13/15  Yes Dejanira Pamintuan, Gwyndolyn Saxon, MD  glucose blood test strip Use as instructed 04/05/16  Yes Makayela Secrest,  Gwyndolyn Saxon, MD  hydrochlorothiazide (HYDRODIURIL) 25 MG tablet Take 2 tablets (50 mg total) by mouth daily. 03/15/17  Yes Kayelee Herbig, Gwyndolyn Saxon, MD  lisinopril (PRINIVIL,ZESTRIL) 40 MG tablet Take 1 tablet (40 mg total) by mouth daily. 03/02/17  Yes Theodoros Stjames, Gwyndolyn Saxon, MD  loperamide (IMODIUM) 2 MG capsule Take 2 mg by mouth as needed for diarrhea or loose stools.   Yes [provider]  Multiple Vitamin (MULTIVITAMIN) capsule Take 1 capsule by mouth daily. 01/13/15  Yes Sherria Riemann, Gwyndolyn Saxon, MD  olopatadine (PATANOL) 0.1 % ophthalmic solution Place 1 drop into both eyes 2 (two) times daily as needed for allergies. 01/13/15  Yes Bo Teicher, Gwyndolyn Saxon, MD  simvastatin (ZOCOR) 80 MG tablet TAKE 1/2 TABLET(40 MG) BY MOUTH DAILY 02/05/17  Yes Olivya Sobol, Gwyndolyn Saxon, MD  triamcinolone (NASACORT AQ) 55 MCG/ACT AERO nasal inhaler Place 2 sprays into the nose daily. 01/13/15  Yes Merriam Brandner, Gwyndolyn Saxon, MD    Allergies  Allergen Reactions  . Clarithromycin   . Sulfa Antibiotics   . Tetracyclines & Related     Past Surgical History:  Procedure Laterality Date  . COLONOSCOPY  2015  . large intestine surgery    . SMALL INTESTINE SURGERY  2012   polyp that could't be removed    Social History  Substance Use Topics  . Smoking status: Former Smoker    Quit date: 10/18/1965  . Smokeless tobacco: Never Used  . Alcohol use 0.6 oz/week    1 Glasses of wine per week    Family History  Problem Relation Age of  Onset  . Heart disease Mother   . Hypertension Mother   . Diabetes Brother   . Heart disease Brother   . Hypertension Brother   . Diabetes Paternal Grandfather   . Stroke Father   . Prostate cancer Brother   . Hypertension Brother     Medication list has been reviewed and updated.  Physical Examination: BP 122/80   Pulse 77   Resp 16   Ht 5\' 5"  (1.651 m)   Wt 164 lb (74.4 kg)   SpO2 98%   BMI 27.29 kg/m   Physical Exam  Constitutional: She appears well-developed and well-nourished.  Cardiovascular: Normal rate,  regular rhythm and normal heart sounds.   Pulmonary/Chest: Effort normal and breath sounds normal.  Musculoskeletal: She exhibits no edema.  Neurological: She is alert.  Skin: Skin is warm and dry.  Psychiatric: She has a normal mood and affect. Her behavior is normal.  Nursing note and vitals reviewed.   Assessment and Plan:  1. Controlled type 2 diabetes mellitus without complication, without long-term current use of insulin (HCC) Off metformin, consider restart as pt felt better on med - HgB A1c; Future - TSH; Future  2. Essential hypertension Well controlled on lisinopril/HCTZ    3. Bilateral lower extremity edema Worse lately, increase HCTZ to 50 mg daily, consider Lasix if ineffective  4. Hyperlipidemia, unspecified hyperlipidemia type Well controlled on Zocor  5. Allergic rhinitis, unspecified seasonality, unspecified trigger Well controlled on Nasacort  6. Overweight (BMI 25.0-29.9) Weight down 5#, continue diet/exercise  7. Vitamin D deficiency On increased supplement - Vitamin D (25 hydroxy); Future  8. Hx of resection of large bowel - B12; Future  9. Need for influenza vaccination - Flu Vaccine QUAD 6+ mos PF IM (Fluarix Quad PF)  Return in about 3 months (around 06/15/2017).  [name]. [facility]  03/16/2017

## 2017-03-26 ENCOUNTER — Other Ambulatory Visit: Payer: Self-pay

## 2017-03-26 DIAGNOSIS — E119 Type 2 diabetes mellitus without complications: Secondary | ICD-10-CM

## 2017-03-26 DIAGNOSIS — Z9049 Acquired absence of other specified parts of digestive tract: Secondary | ICD-10-CM

## 2017-03-26 DIAGNOSIS — E559 Vitamin D deficiency, unspecified: Secondary | ICD-10-CM

## 2017-03-27 ENCOUNTER — Other Ambulatory Visit: Payer: Self-pay | Admitting: Family Medicine

## 2017-03-27 DIAGNOSIS — E119 Type 2 diabetes mellitus without complications: Secondary | ICD-10-CM | POA: Diagnosis not present

## 2017-03-27 DIAGNOSIS — E559 Vitamin D deficiency, unspecified: Secondary | ICD-10-CM | POA: Diagnosis not present

## 2017-03-27 DIAGNOSIS — Z9049 Acquired absence of other specified parts of digestive tract: Secondary | ICD-10-CM | POA: Diagnosis not present

## 2017-03-28 ENCOUNTER — Other Ambulatory Visit: Payer: Self-pay | Admitting: Family Medicine

## 2017-03-28 LAB — VITAMIN D 25 HYDROXY (VIT D DEFICIENCY, FRACTURES): VIT D 25 HYDROXY: 64.7 ng/mL (ref 30.0–100.0)

## 2017-03-28 LAB — HEMOGLOBIN A1C
ESTIMATED AVERAGE GLUCOSE: 131 mg/dL
Hgb A1c MFr Bld: 6.2 % — ABNORMAL HIGH (ref 4.8–5.6)

## 2017-03-28 LAB — VITAMIN B12: VITAMIN B 12: 1118 pg/mL (ref 232–1245)

## 2017-03-28 LAB — TSH: TSH: 1.26 u[IU]/mL (ref 0.450–4.500)

## 2017-03-28 MED ORDER — VITAMIN D 50 MCG (2000 UT) PO CAPS: 1.0000 | ORAL_CAPSULE | Freq: Every day | ORAL | Status: DC

## 2017-06-01 ENCOUNTER — Other Ambulatory Visit: Payer: Self-pay | Admitting: Family Medicine

## 2017-06-01 DIAGNOSIS — I10 Essential (primary) hypertension: Secondary | ICD-10-CM

## 2017-06-01 DIAGNOSIS — E785 Hyperlipidemia, unspecified: Secondary | ICD-10-CM

## 2017-06-01 MED ORDER — LISINOPRIL 40 MG PO TABS: 40.0000 mg | ORAL_TABLET | Freq: Every day | ORAL | 3 refills | Status: DC

## 2017-06-01 MED ORDER — SIMVASTATIN 80 MG PO TABS: 40.0000 mg | ORAL_TABLET | Freq: Every day | ORAL | 3 refills | Status: DC

## 2017-06-06 ENCOUNTER — Other Ambulatory Visit: Payer: Self-pay

## 2017-06-06 DIAGNOSIS — I10 Essential (primary) hypertension: Secondary | ICD-10-CM

## 2017-06-06 MED ORDER — HYDROCHLOROTHIAZIDE 25 MG PO TABS: 50.0000 mg | ORAL_TABLET | Freq: Every day | ORAL | 3 refills | Status: DC

## 2017-06-06 MED ORDER — LISINOPRIL 40 MG PO TABS: 40.0000 mg | ORAL_TABLET | Freq: Every day | ORAL | 3 refills | Status: DC

## 2017-06-18 ENCOUNTER — Ambulatory Visit (INDEPENDENT_AMBULATORY_CARE_PROVIDER_SITE_OTHER): Payer: Medicare Other | Admitting: Family Medicine

## 2017-06-18 ENCOUNTER — Encounter: Payer: Self-pay | Admitting: Family Medicine

## 2017-06-18 VITALS — BP 117/76 | HR 88 | Resp 16 | Ht 65.0 in | Wt 166.0 lb

## 2017-06-18 DIAGNOSIS — I10 Essential (primary) hypertension: Secondary | ICD-10-CM | POA: Diagnosis not present

## 2017-06-18 DIAGNOSIS — J309 Allergic rhinitis, unspecified: Secondary | ICD-10-CM | POA: Diagnosis not present

## 2017-06-18 DIAGNOSIS — E559 Vitamin D deficiency, unspecified: Secondary | ICD-10-CM

## 2017-06-18 DIAGNOSIS — E663 Overweight: Secondary | ICD-10-CM | POA: Diagnosis not present

## 2017-06-18 DIAGNOSIS — E785 Hyperlipidemia, unspecified: Secondary | ICD-10-CM | POA: Diagnosis not present

## 2017-06-18 DIAGNOSIS — E119 Type 2 diabetes mellitus without complications: Secondary | ICD-10-CM | POA: Diagnosis not present

## 2017-06-18 DIAGNOSIS — R600 Localized edema: Secondary | ICD-10-CM

## 2017-06-18 LAB — GLUCOSE, POCT (MANUAL RESULT ENTRY): POC GLUCOSE: 188 mg/dL — AB (ref 70–99)

## 2017-06-18 MED ORDER — HYDROCHLOROTHIAZIDE 25 MG PO TABS: 25.0000 mg | ORAL_TABLET | Freq: Every day | ORAL | 3 refills | Status: DC

## 2017-06-19 LAB — COMPREHENSIVE METABOLIC PANEL
ALBUMIN: 4.3 g/dL (ref 3.5–4.8)
ALT: 18 IU/L (ref 0–32)
AST: 20 IU/L (ref 0–40)
Albumin/Globulin Ratio: 1.7 (ref 1.2–2.2)
Alkaline Phosphatase: 57 IU/L (ref 39–117)
BUN / CREAT RATIO: 23 (ref 12–28)
BUN: 15 mg/dL (ref 8–27)
Bilirubin Total: 1.5 mg/dL — ABNORMAL HIGH (ref 0.0–1.2)
CALCIUM: 9.6 mg/dL (ref 8.7–10.3)
CHLORIDE: 100 mmol/L (ref 96–106)
CO2: 27 mmol/L (ref 20–29)
CREATININE: 0.65 mg/dL (ref 0.57–1.00)
GFR, EST AFRICAN AMERICAN: 100 mL/min/{1.73_m2} (ref >59)
GFR, EST NON AFRICAN AMERICAN: 87 mL/min/{1.73_m2} (ref >59)
GLUCOSE: 119 mg/dL — AB (ref 65–99)
Globulin, Total: 2.5 g/dL (ref 1.5–4.5)
Potassium: 4 mmol/L (ref 3.5–5.2)
Sodium: 144 mmol/L (ref 134–144)
TOTAL PROTEIN: 6.8 g/dL (ref 6.0–8.5)

## 2017-06-19 LAB — LIPID PANEL
Chol/HDL Ratio: 3.5 ratio (ref 0.0–4.4)
Cholesterol, Total: 171 mg/dL (ref 100–199)
HDL: 49 mg/dL (ref >39)
LDL Calculated: 87 mg/dL (ref 0–99)
Triglycerides: 175 mg/dL — ABNORMAL HIGH (ref 0–149)
VLDL Cholesterol Cal: 35 mg/dL (ref 5–40)

## 2017-06-19 LAB — HEMOGLOBIN A1C
ESTIMATED AVERAGE GLUCOSE: 126 mg/dL
Hgb A1c MFr Bld: 6 % — ABNORMAL HIGH (ref 4.8–5.6)

## 2017-06-19 LAB — VITAMIN D 25 HYDROXY (VIT D DEFICIENCY, FRACTURES): VIT D 25 HYDROXY: 32.3 ng/mL (ref 30.0–100.0)

## 2017-06-19 NOTE — Progress Notes (Signed)
Date:  06/18/2017   Name:  Angela Pope   DOB:  [birth year]   MRN:  [id number]  PCP:  Adline Potter, MD    Chief Complaint: Diabetes (BS usually 109-123 but trying to diet ) and Edema (Does not feel HCTZ is helping at all. )   History of Present Illness:  This is a 77 y.o. female seen for 3 month f/u. T2DM off metformin, a1c 6.2% in Nov. Edema did not improve on increased HCTZ. AR adequately controlled on Nasacort. Vit D decreased to 2000 units daily. C/o R flank pain positional past week, concerned re: gallstones  Review of Systems:  Review of Systems  Constitutional: Negative for chills and fever.  Respiratory: Negative for cough and shortness of breath.   Cardiovascular: Negative for chest pain and leg swelling.  Genitourinary: Negative for difficulty urinating.  Neurological: Negative for syncope and light-headedness.    Patient Active Problem List   Diagnosis Date Noted  . 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 Bilateral lower extremity edema 01/14/2015  . HTN (hypertension) 01/13/2015  . Diabetes mellitus type 2, controlled, without complications ([place]) 00/34/9179  . Hyperlipidemia 01/13/2015  . Overweight (BMI 25.0-29.9) 01/13/2015  . Allergic rhinitis 01/13/2015  . Hx of colonic polyps 01/13/2015  . 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 Vitamin D deficiency 01/13/2015  . FH: heart disease 01/13/2015  . FH: stroke 01/13/2015    Prior to Admission medications   Medication Sig Start Date End Date Taking? Authorizing Provider  glucosamine-chondroitin (MAX GLUCOSAMINE CHONDROITIN) 500-400 MG tablet Take 1 tablet by mouth 2 (two) times daily. 01/13/15  Yes Jazlyne Gauger, Gwyndolyn Saxon, MD  glucose blood test strip Use as instructed 04/05/16  Yes Triniti Gruetzmacher, Gwyndolyn Saxon, MD  hydrochlorothiazide (HYDRODIURIL) 25 MG tablet Take 1 tablet (25 mg total) by mouth daily. 06/18/17  Yes Diora Bellizzi, Gwyndolyn Saxon, MD  lisinopril (PRINIVIL,ZESTRIL) 40 MG tablet Take 1 tablet (40 mg  total) by mouth daily. 06/06/17  Yes Mozella Rexrode, Gwyndolyn Saxon, MD  loperamide (IMODIUM) 2 MG capsule Take 2 mg by mouth as needed for diarrhea or loose stools.   Yes [provider]  Multiple Vitamin (MULTIVITAMIN) capsule Take 1 capsule by mouth daily. 01/13/15  Yes Jersie Beel, Gwyndolyn Saxon, MD  olopatadine (PATANOL) 0.1 % ophthalmic solution Place 1 drop into both eyes 2 (two) times daily as needed for allergies. 01/13/15  Yes Kristapher [place], Gwyndolyn Saxon, MD  simvastatin (ZOCOR) 80 MG tablet Take 0.5 tablets (40 mg total) by mouth daily. 06/01/17  Yes Alanna Storti, Gwyndolyn Saxon, MD  triamcinolone (NASACORT AQ) 55 MCG/ACT AERO nasal inhaler Place 2 sprays into the nose daily. 01/13/15  Yes Shantara Goosby, Gwyndolyn Saxon, MD    Allergies  Allergen Reactions  . Clarithromycin   . Sulfa Antibiotics   . Tetracyclines & Related     Past Surgical History:  Procedure Laterality Date  . COLONOSCOPY  2015  . large intestine surgery    . SMALL INTESTINE SURGERY  2012   polyp that could't be removed    Social History   Tobacco Use  . Smoking status: Former Smoker    Last attempt to quit: 10/18/1965    Years since quitting: 51.7  . Smokeless tobacco: Never Used  Substance Use Topics  . Alcohol use: Yes    Alcohol/week: 0.6 oz    Types: 1 Glasses of wine per week  . Drug use: No    Family History  Problem Relation Age of Onset  . Heart disease Mother   . Hypertension Mother   .  Diabetes Brother   . Heart disease Brother   . Hypertension Brother   . Diabetes Paternal Grandfather   . Stroke Father   . Prostate cancer Brother   . Hypertension Brother     Medication list has been reviewed and updated.  Physical Examination: BP 117/76   Pulse 88   Resp 16   Ht 5\' 5"  (1.651 m)   Wt 166 lb (75.3 kg)   SpO2 95%   BMI 27.62 kg/m   Physical Exam  Constitutional: She appears well-developed and well-nourished.  Cardiovascular: Normal rate, regular rhythm and normal heart sounds.  Pulmonary/Chest: Effort normal and breath sounds  normal.  Musculoskeletal:  No paralumbar or chest wall tenderness 1+ BLE edema  Neurological: She is alert.  Skin: Skin is warm and dry.  Psychiatric: She has a normal mood and affect. Her behavior is normal.  Nursing note and vitals reviewed.   Assessment and Plan:  1. Controlled type 2 diabetes mellitus without complication, without long-term current use of insulin (HCC) FSBG 188 today, well controlled off metformin - HgB A1c - POCT Glucose (CBG)  2. Essential hypertension Well controlled on lisinopril, decrease HCTZ to 25 mg daily, consider d/c - Comprehensive Metabolic Panel (CMET)  3. Hyperlipidemia, unspecified hyperlipidemia type Well controlled on Zocor - Lipid Profile  4. Allergic rhinitis, unspecified seasonality, unspecified trigger Adequate control on Nasacort  5. Bilateral lower extremity edema Unimproved on increased HCTZ, discussed Lasix, mostly asymptomatic, will monitor for now  6. Overweight (BMI 25.0-29.9) Stable, exercise/weight loss discussed  7. Vitamin D deficiency Off supplement - Vitamin D (25 hydroxy)  Return in about 3 months (around 09/16/2017).  [name]. [facility]  06/19/2017

## 2017-09-17 ENCOUNTER — Ambulatory Visit (INDEPENDENT_AMBULATORY_CARE_PROVIDER_SITE_OTHER): Payer: Medicare Other | Admitting: Family Medicine

## 2017-09-17 ENCOUNTER — Other Ambulatory Visit: Payer: Self-pay

## 2017-09-17 ENCOUNTER — Encounter: Payer: Self-pay | Admitting: Family Medicine

## 2017-09-17 VITALS — BP 132/82 | HR 79 | Resp 16 | Ht 65.0 in | Wt 165.4 lb

## 2017-09-17 DIAGNOSIS — I10 Essential (primary) hypertension: Secondary | ICD-10-CM

## 2017-09-17 DIAGNOSIS — R600 Localized edema: Secondary | ICD-10-CM | POA: Diagnosis not present

## 2017-09-17 DIAGNOSIS — E785 Hyperlipidemia, unspecified: Secondary | ICD-10-CM

## 2017-09-17 DIAGNOSIS — M150 Primary generalized (osteo)arthritis: Secondary | ICD-10-CM

## 2017-09-17 DIAGNOSIS — J302 Other seasonal allergic rhinitis: Secondary | ICD-10-CM | POA: Diagnosis not present

## 2017-09-17 DIAGNOSIS — M1990 Unspecified osteoarthritis, unspecified site: Secondary | ICD-10-CM | POA: Insufficient documentation

## 2017-09-17 DIAGNOSIS — M159 Polyosteoarthritis, unspecified: Secondary | ICD-10-CM

## 2017-09-17 DIAGNOSIS — E119 Type 2 diabetes mellitus without complications: Secondary | ICD-10-CM | POA: Diagnosis not present

## 2017-09-17 MED ORDER — SIMVASTATIN 40 MG PO TABS: 40.0000 mg | ORAL_TABLET | Freq: Every day | ORAL | 3 refills | Status: DC

## 2017-09-17 MED ORDER — LISINOPRIL 40 MG PO TABS: 40.0000 mg | ORAL_TABLET | Freq: Every day | ORAL | 3 refills | Status: DC

## 2017-09-17 MED ORDER — GLUCOSE BLOOD VI STRP: ORAL_STRIP | 0 refills | Status: DC

## 2017-09-17 MED ORDER — HYDROCHLOROTHIAZIDE 25 MG PO TABS: 25.0000 mg | ORAL_TABLET | Freq: Every day | ORAL | 3 refills | Status: DC

## 2017-09-17 NOTE — Progress Notes (Signed)
Date:  09/17/2017   Name:  Angela Pope   DOB:  Oct 21, 1940   MRN:  [id number]  PCP:  Adline Potter, MD    Chief Complaint: Diabetes (refill strips and meds BS Fasting 140 all of sudden today usually ranges 100-110); Hypertension (needs refills ); and Hyperlipidemia (need refills )   History of Present Illness:  This is a 77 y.o. female seen for three month f/u. T2DM diet controlled off metformin, concerned that FSBG 140 this AM, checking 3x/wk. HCTZ decreased to 25 mg daily last visit, BLE edema about the same, improves overnight. AR sxs worse, taking Benedryl in addition to Nasacort. OA sxs stable on G/C. Concerned re: FH afib.  Review of Systems:  Review of Systems  Constitutional: Negative for chills and fever.  Respiratory: Negative for cough and shortness of breath.   Cardiovascular: Negative for chest pain.  Genitourinary: Negative for difficulty urinating.  Neurological: Negative for syncope and light-headedness.    Patient Active Problem List   Diagnosis Date Noted  . Osteoarthritis 09/17/2017  . 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 Bilateral lower extremity edema 01/14/2015  . HTN (hypertension) 01/13/2015  . Diabetes mellitus type 2, controlled, without complications ([place]) 69/48/5462  . Hyperlipidemia 01/13/2015  . Overweight (BMI 25.0-29.9) 01/13/2015  . Allergic rhinitis 01/13/2015  . Hx of colonic polyps 01/13/2015  . 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 Vitamin D deficiency 01/13/2015  . FH: heart disease 01/13/2015  . FH: stroke 01/13/2015    Prior to Admission medications   Medication Sig Start Date End Date Taking? Authorizing Provider  glucosamine-chondroitin (MAX GLUCOSAMINE CHONDROITIN) 500-400 MG tablet Take 1 tablet by mouth 2 (two) times daily. 01/13/15  Yes Oshay Stranahan, Gwyndolyn Saxon, MD  glucose blood test strip Use as instructed 04/05/16  Yes Konner Warrior, Gwyndolyn Saxon, MD  hydrochlorothiazide (HYDRODIURIL) 25 MG tablet Take 1  tablet (25 mg total) by mouth daily. 09/17/17  Yes Lauramae Kneisley, Gwyndolyn Saxon, MD  lisinopril (PRINIVIL,ZESTRIL) 40 MG tablet Take 1 tablet (40 mg total) by mouth daily. 09/17/17  Yes Arabel Barcenas, Gwyndolyn Saxon, MD  loperamide (IMODIUM) 2 MG capsule Take 2 mg by mouth as needed for diarrhea or loose stools.   Yes [provider]  Multiple Vitamin (MULTIVITAMIN) capsule Take 1 capsule by mouth daily. 01/13/15  Yes Siriyah Ambrosius, Gwyndolyn Saxon, MD  olopatadine (PATANOL) 0.1 % ophthalmic solution Place 1 drop into both eyes 2 (two) times daily as needed for allergies. 01/13/15  Yes Alanta Scobey, Gwyndolyn Saxon, MD  simvastatin (ZOCOR) 40 MG tablet Take 1 tablet (40 mg total) by mouth daily. 09/17/17  Yes Dareth Andrew, Gwyndolyn Saxon, MD  triamcinolone (NASACORT AQ) 55 MCG/ACT AERO nasal inhaler Place 2 sprays into the nose daily. 01/13/15  Yes Symphoni Helbling, Gwyndolyn Saxon, MD    Allergies  Allergen Reactions  . Clarithromycin   . Sulfa Antibiotics   . Tetracyclines & Related     Past Surgical History:  Procedure Laterality Date  . COLONOSCOPY  2015  . large intestine surgery    . SMALL INTESTINE SURGERY  2012   polyp that could't be removed    Social History   Tobacco Use  . Smoking status: Former Smoker    Last attempt to quit: 10/18/1965    Years since quitting: 51.9  . Smokeless tobacco: Never Used  Substance Use Topics  . Alcohol use: Yes    Alcohol/week: 0.6 oz    Types: 1 Glasses of wine per week  . Drug use: No    Family History  Problem Relation Age of Onset  . Heart disease Mother   . Hypertension Mother   . Diabetes Brother   . Heart disease Brother   . Hypertension Brother   . Diabetes Paternal Grandfather   . Stroke Father   . Prostate cancer Brother   . Hypertension Brother     Medication list has been reviewed and updated.  Physical Examination: BP 132/82   Pulse 79   Resp 16   Ht 5\' 5"  (1.651 m)   Wt 165 lb 6.4 oz (75 kg)   SpO2 97%   BMI 27.52 kg/m   Physical Exam  Constitutional: She appears well-developed and  well-nourished.  Cardiovascular: Normal rate, regular rhythm and normal heart sounds.  Pulmonary/Chest: Effort normal and breath sounds normal.  Musculoskeletal:  1+ BLE edema  Neurological: She is alert.  Skin: Skin is warm and dry.  Psychiatric: She has a normal mood and affect. Her behavior is normal.  Nursing note and vitals reviewed.   Assessment and Plan:  1. Controlled type 2 diabetes mellitus without complication, without long-term current use of insulin (HCC) Diet controlled off metformin, discussed rx decisions based on a1c, can decrease FSBG to once weekly - HgB A1c  2. Essential hypertension Adequate control on lisinopril/HCTZ, refill  3. Hyperlipidemia Well controlled on Zocor, refill  4. Seasonal allergic rhinitis, unspecified trigger Advised Claritin or Zyrtec instead of Benedryl, cont Nasacort  5. Primary osteoarthritis involving multiple joints Stable on G/C  6. Bilateral lower extremity edema Stable on decreased HCTZ  Return in about 6 months (around 03/19/2018).  [name]. [facility]  09/17/2017

## 2017-09-18 LAB — HEMOGLOBIN A1C
Est. average glucose Bld gHb Est-mCnc: 131 mg/dL
Hgb A1c MFr Bld: 6.2 % — ABNORMAL HIGH (ref 4.8–5.6)

## 2017-10-01 ENCOUNTER — Other Ambulatory Visit: Payer: Self-pay

## 2017-10-31 ENCOUNTER — Ambulatory Visit (INDEPENDENT_AMBULATORY_CARE_PROVIDER_SITE_OTHER): Payer: Medicare Other

## 2017-10-31 VITALS — BP 102/64 | HR 78 | Temp 98.6°F | Resp 12 | Ht 65.0 in | Wt 172.2 lb

## 2017-10-31 DIAGNOSIS — Z0000 Encounter for general adult medical examination without abnormal findings: Secondary | ICD-10-CM

## 2017-10-31 NOTE — Patient Instructions (Signed)
Angela Pope , Thank you for taking time to come for your Medicare Wellness Visit. I appreciate your ongoing commitment to your health goals. Please review the following plan we discussed and let me know if I can assist you in the future.   Screening recommendations/referrals: Colorectal Screening: No longer required Mammogram: No longer required Bone Density: No loner required  Vision and Dental Exams: Recommended annual ophthalmology exams for early detection of glaucoma and other disorders of the eye Recommended annual dental exams for proper oral hygiene  Diabetic Exams: Recommended annual diabetic eye exams for early detection of retinopathy Recommended annual diabetic foot exams for early detection of peripheral neuropathy.  Diabetic Eye Exam: Up to date Diabetic Foot Exam: Overdue  Vaccinations: Influenza vaccine: Up to date Pneumococcal vaccine: Up to date Tdap vaccine: Up to date Shingles vaccine: Please call your insurance company to determine your out of pocket expense for the Shingrix vaccine. You may also receive this vaccine at your local pharmacy or Health Dept.  Advanced directives: Please bring a copy of your POA (Power of Attorney) and/or Living Will to your next appointment.  Conditions/risks identified: Recommend to drink at least 6-8 8oz glasses of water per day.  Next appointment: Please schedule your Annual Wellness Visit with your Nurse Health Advisor in one year.  Preventive Care 77 Years and Older, Female Preventive care refers to lifestyle choices and visits with your health care provider that can promote health and wellness. What does preventive care include?  A yearly physical exam. This is also called an annual well check.  Dental exams once or twice a year.  Routine eye exams. Ask your health care provider how often you should have your eyes checked.  Personal lifestyle choices, including:  Daily care of your teeth and gums.  Regular physical  activity.  Eating a healthy diet.  Avoiding tobacco and drug use.  Limiting alcohol use.  Practicing safe sex.  Taking low-dose aspirin every day.  Taking vitamin and mineral supplements as recommended by your health care provider. What happens during an annual well check? The services and screenings done by your health care provider during your annual well check will depend on your age, overall health, lifestyle risk factors, and family history of disease. Counseling  Your health care provider may ask you questions about your:  Alcohol use.  Tobacco use.  Drug use.  Emotional well-being.  Home and relationship well-being.  Sexual activity.  Eating habits.  History of falls.  Memory and ability to understand (cognition).  Work and work Statistician.  Reproductive health. Screening  You may have the following tests or measurements:  Height, weight, and BMI.  Blood pressure.  Lipid and cholesterol levels. These may be checked every 5 years, or more frequently if you are over 65 years old.  Skin check.  Lung cancer screening. You may have this screening every year starting at age 65 if you have a 30-pack-year history of smoking and currently smoke or have quit within the past 15 years.  Fecal occult blood test (FOBT) of the stool. You may have this test every year starting at age 67.  Flexible sigmoidoscopy or colonoscopy. You may have a sigmoidoscopy every 5 years or a colonoscopy every 10 years starting at age 72.  Hepatitis C blood test.  Hepatitis B blood test.  Sexually transmitted disease (STD) testing.  Diabetes screening. This is done by checking your blood sugar (glucose) after you have not eaten for a while (fasting). You may  have this done every 1-3 years.  Bone density scan. This is done to screen for osteoporosis. You may have this done starting at age 32.  Mammogram. This may be done every 1-2 years. Talk to your health care provider about  how often you should have regular mammograms. Talk with your health care provider about your test results, treatment options, and if necessary, the need for more tests. Vaccines  Your health care provider may recommend certain vaccines, such as:  Influenza vaccine. This is recommended every year.  Tetanus, diphtheria, and acellular pertussis (Tdap, Td) vaccine. You may need a Td booster every 10 years.  Zoster vaccine. You may need this after age 39.  Pneumococcal 13-valent conjugate (PCV13) vaccine. One dose is recommended after age 2.  Pneumococcal polysaccharide (PPSV23) vaccine. One dose is recommended after age 46. Talk to your health care provider about which screenings and vaccines you need and how often you need them. This information is not intended to replace advice given to you by your health care provider. Make sure you discuss any questions you have with your health care provider. Document Released: 06/11/2015 Document Revised: 02/02/2016 Document Reviewed: 03/16/2015 Elsevier Interactive Patient Education  2017 [place] Prevention in the Home Falls can cause injuries. They can happen to people of all ages. There are many things you can do to make your home safe and to help prevent falls. What can I do on the outside of my home?  Regularly fix the edges of walkways and driveways and fix any cracks.  Remove anything that might make you trip as you walk through a door, such as a raised step or threshold.  Trim any bushes or trees on the path to your home.  Use bright outdoor lighting.  Clear any walking paths of anything that might make someone trip, such as rocks or tools.  Regularly check to see if handrails are loose or broken. Make sure that both sides of any steps have handrails.  Any raised decks and porches should have guardrails on the edges.  Have any leaves, snow, or ice cleared regularly.  Use sand or salt on walking paths during  winter.  Clean up any spills in your garage right away. This includes oil or grease spills. What can I do in the bathroom?  Use night lights.  Install grab bars by the toilet and in the tub and shower. Do not use towel bars as grab bars.  Use non-skid mats or decals in the tub or shower.  If you need to sit down in the shower, use a plastic, non-slip stool.  Keep the floor dry. Clean up any water that spills on the floor as soon as it happens.  Remove soap buildup in the tub or shower regularly.  Attach bath mats securely with double-sided non-slip rug tape.  Do not have throw rugs and other things on the floor that can make you trip. What can I do in the bedroom?  Use night lights.  Make sure that you have a light by your bed that is easy to reach.  Do not use any sheets or blankets that are too big for your bed. They should not hang down onto the floor.  Have a firm chair that has side arms. You can use this for support while you get dressed.  Do not have throw rugs and other things on the floor that can make you trip. What can I do in the kitchen?  Clean up  any spills right away.  Avoid walking on wet floors.  Keep items that you use a lot in easy-to-reach places.  If you need to reach something above you, use a strong step stool that has a grab bar.  Keep electrical cords out of the way.  Do not use floor polish or wax that makes floors slippery. If you must use wax, use non-skid floor wax.  Do not have throw rugs and other things on the floor that can make you trip. What can I do with my stairs?  Do not leave any items on the stairs.  Make sure that there are handrails on both sides of the stairs and use them. Fix handrails that are broken or loose. Make sure that handrails are as long as the stairways.  Check any carpeting to make sure that it is firmly attached to the stairs. Fix any carpet that is loose or worn.  Avoid having throw rugs at the top or  bottom of the stairs. If you do have throw rugs, attach them to the floor with carpet tape.  Make sure that you have a light switch at the top of the stairs and the bottom of the stairs. If you do not have them, ask someone to add them for you. What else can I do to help prevent falls?  Wear shoes that:  Do not have high heels.  Have rubber bottoms.  Are comfortable and fit you well.  Are closed at the toe. Do not wear sandals.  If you use a stepladder:  Make sure that it is fully opened. Do not climb a closed stepladder.  Make sure that both sides of the stepladder are locked into place.  Ask someone to hold it for you, if possible.  Clearly mark and make sure that you can see:  Any grab bars or handrails.  First and last steps.  Where the edge of each step is.  Use tools that help you move around (mobility aids) if they are needed. These include:  Canes.  Walkers.  Scooters.  Crutches.  Turn on the lights when you go into a dark area. Replace any light bulbs as soon as they burn out.  Set up your furniture so you have a clear path. Avoid moving your furniture around.  If any of your floors are uneven, fix them.  If there are any pets around you, be aware of where they are.  Review your medicines with your doctor. Some medicines can make you feel dizzy. This can increase your chance of falling. Ask your doctor what other things that you can do to help prevent falls. This information is not intended to replace advice given to you by your health care provider. Make sure you discuss any questions you have with your health care provider. Document Released: 03/11/2009 Document Revised: 10/21/2015 Document Reviewed: 06/19/2014 Elsevier Interactive Patient Education  2017 [employer].

## 2017-10-31 NOTE — Progress Notes (Signed)
Subjective:   Angela Pope is a 77 y.o. female who presents for Medicare Annual (Subsequent) preventive examination.  Review of Systems:  N/A Cardiac Risk Factors include: advanced age (>87men, >27 women);dyslipidemia;hypertension;diabetes mellitus;sedentary lifestyle     Objective:     Vitals: BP 102/64 (BP Location: Right Arm, Patient Position: Sitting, Cuff Size: Normal)   Pulse 78   Temp 98.6 F (37 C) (Oral)   Resp 12   Ht 5\' 5"  (1.651 m)   Wt 172 lb 3.2 oz (78.1 kg)   SpO2 95%   BMI 28.66 kg/m   Body mass index is 28.66 kg/m.  Advanced Directives 10/31/2017 03/15/2017 10/30/2016 01/13/2015  Does Patient Have a Medical Advance Directive? Yes No Yes Yes  Type of Paramedic of [place];Living will - [place];Living will Living will  Copy of [place] in Chart? No - copy requested - No - copy requested -    Tobacco Social History   Tobacco Use  Smoking Status Former Smoker  . Packs/day: 0.25  . Years: 10.00  . Pack years: 2.50  . Types: Cigarettes  . Last attempt to quit: 10/18/1965  . Years since quitting: 52.0  Smokeless Tobacco Never Used  Tobacco Comment   smoking cessation materials not required     Counseling given: No Comment: smoking cessation materials not required  Clinical Intake:  Pre-visit preparation completed: Yes  Pain : No/denies pain Pain Score: 0-No pain   BMI - recorded: 28.66 Nutritional Status: BMI 25 -29 Overweight Nutritional Risks: None  Nutrition Risk Assessment: Has the patient had any N/V/D within the last 2 months?  No Does the patient have any non-healing wounds?  No Has the patient had any unintentional weight loss or weight gain?  No  Is the patient diabetic?  Yes If diabetic, was a CBG obtained today?  No Did the patient bring in their glucometer from home?  No Comments: Pt monitors CBG's twice per month. Denies any financial strains with the device or  supplies.  Diabetic Exams: Diabetic Eye Exam: Completed 03/16/17 Diabetic Foot Exam: Overdue for diabetic foot exam. Pt has been advised about the importance in completing this exam. Pt is scheduled to complete this exam with Dr. Ronnald Ramp on 03/19/18.   How often do you need to have someone help you when you read instructions, pamphlets, or other written materials from your doctor or pharmacy?: 1 - Never  Interpreter Needed?: No  Information entered by :: Idell Pickles, LPN  Past Medical History:  Diagnosis Date  . Diabetes mellitus without complication ([place])    controls with diet  . Hyperlipidemia   . Hypertension    Past Surgical History:  Procedure Laterality Date  . COLONOSCOPY  2015  . large intestine surgery    . SMALL INTESTINE SURGERY  2012   polyp that could't be removed   Family History  Problem Relation Age of Onset  . Heart disease Mother   . Hypertension Mother   . Atrial fibrillation Mother   . Diabetes Brother   . Heart disease Brother   . Hypertension Brother   . Diabetes Paternal Grandfather   . Stroke Father   . Prostate cancer Brother   . Hypertension Brother   . Healthy Daughter   . Diabetes Son    Social History   Socioeconomic History  . Marital status: Divorced    Spouse name: Not on file  . Number of children: 2  . Years of education:  [employer] college  . Highest education level: 12th grade  Occupational History  . Occupation: Retired  Scientific laboratory technician  . Financial resource strain: Not hard at all  . Food insecurity:    Worry: Never true    Inability: Never true  . Transportation needs:    Medical: No    Non-medical: No  Tobacco Use  . Smoking status: Former Smoker    Packs/day: 0.25    Years: 10.00    Pack years: 2.50    Types: Cigarettes    Last attempt to quit: 10/18/1965    Years since quitting: 52.0  . Smokeless tobacco: Never Used  . Tobacco comment: smoking cessation materials not required  Substance and Sexual Activity  . Alcohol  use: Not Currently    Alcohol/week: 0.0 oz  . Drug use: No  . Sexual activity: Never  Lifestyle  . Physical activity:    Days per week: 0 days    Minutes per session: 0 min  . Stress: Not at all  Relationships  . Social connections:    Talks on phone: Patient refused    Gets together: Patient refused    Attends religious service: Patient refused    Active member of club or organization: Patient refused    Attends meetings of clubs or organizations: Patient refused    Relationship status: Divorced  Other Topics Concern  . Not on file  Social History Narrative  . Not on file    Outpatient Encounter Medications as of 10/31/2017  Medication Sig  . glucosamine-chondroitin (MAX GLUCOSAMINE CHONDROITIN) 500-400 MG tablet Take 1 tablet by mouth 2 (two) times daily.  Marland Kitchen glucose blood test strip Use as instructed  . hydrochlorothiazide (HYDRODIURIL) 25 MG tablet Take 1 tablet (25 mg total) by mouth daily.  Marland Kitchen lisinopril (PRINIVIL,ZESTRIL) 40 MG tablet Take 1 tablet (40 mg total) by mouth daily.  Marland Kitchen loperamide (IMODIUM) 2 MG capsule Take 2 mg by mouth as needed for diarrhea or loose stools.  . Multiple Vitamin (MULTIVITAMIN) capsule Take 1 capsule by mouth daily.  Marland Kitchen olopatadine (PATANOL) 0.1 % ophthalmic solution Place 1 drop into both eyes 2 (two) times daily as needed for allergies.  . simvastatin (ZOCOR) 40 MG tablet Take 1 tablet (40 mg total) by mouth daily.  Marland Kitchen triamcinolone (NASACORT AQ) 55 MCG/ACT AERO nasal inhaler Place 2 sprays into the nose daily.   No facility-administered encounter medications on file as of 10/31/2017.     Activities of Daily Living In your present state of health, do you have any difficulty performing the following activities: 10/31/2017  Hearing? N  Comment denies hearing aids  Vision? N  Comment wears eyeglasses  Difficulty concentrating or making decisions? N  Walking or climbing stairs? N  Dressing or bathing? N  Doing errands, shopping? N  Preparing  Food and eating ? N  Comment denies dentures  Using the Toilet? N  In the past six months, have you accidently leaked urine? Y  Comment urgency  Do you have problems with loss of bowel control? N  Managing your Medications? N  Managing your Finances? N  Housekeeping or managing your Housekeeping? N  Some recent data might be hidden    Patient Care Team: Juline Patch, MD as PCP - General (Family Medicine)    Assessment:   This is a routine wellness examination for Kristel.  Exercise Activities and Dietary recommendations Current Exercise Habits: The patient does not participate in regular exercise at present, Exercise limited by: None identified  Goals    . DIET - INCREASE WATER INTAKE     Recommend to drink at least 6-8 8oz glasses of water per day.       Fall Risk Fall Risk  10/31/2017 03/15/2017 12/18/2016 10/30/2016 10/19/2015  Falls in the past year? No No Yes Yes Yes  Comment - - [facility] Telephone Survey: data to providers prior to load - -  Number falls in past yr: - - 1 1 1   Comment - - [facility]i Telephone Survey Actual Response = 1 - -  Injury with Fall? - - No No -  Risk for fall due to : History of fall(s);Impaired vision - - - Impaired balance/gait  Risk for fall due to: Comment wears eyeglasses - - - -   FALL RISK PREVENTION PERTAINING TO HOME: Is your home free of loose throw rugs in walkways, pet beds, electrical cords, etc? Yes Is there adequate lighting in your home to reduce risk of falls?  Yes Are there stairs in or around your home WITH handrails? Yes  ASSISTIVE DEVICES UTILIZED TO PREVENT FALLS: Use of a cane, walker or w/c? No Grab bars in the bathroom? Yes  Shower chair or a place to sit while bathing? Yes An elevated toilet seat or a handicapped toilet? Yes  Timed Get Up and Go Performed: Yes. Pt ambulated 10 feet within 8 sec. Gait stead-fast and without the use of an assistive device. No intervention required at this time. Fall risk prevention has been  discussed.  Community Resource Referral:  [place] [place] Referral not required at this time.   Depression Screen PHQ 2/9 Scores 10/31/2017 03/15/2017 10/30/2016 10/19/2015  PHQ - 2 Score 0 0 0 0  PHQ- 9 Score 0 - - -     Cognitive Function     6CIT Screen 10/31/2017 10/30/2016  What Year? 4 points 0 points  What month? 0 points 0 points  What time? 0 points 0 points  Count back from 20 0 points 0 points  Months in reverse 0 points 0 points  Repeat phrase 0 points 2 points  Total Score 4 2    Immunization History  Administered Date(s) Administered  . Influenza,inj,Quad PF,6+ Mos 03/10/2015, 03/21/2016, 03/15/2017  . Pneumococcal Conjugate-13 05/29/2009  . Pneumococcal Polysaccharide-23 05/29/2010  . Td 05/29/2010    Qualifies for Shingles Vaccine? Yes. Due for Shingrix. Education has been provided regarding the importance of this vaccine. Pt has been advised to call her insurance company to determine her out of pocket expense. Advised she may also receive this vaccine at her local pharmacy or Health Dept. Verbalized acceptance and understanding.  Screening Tests Health Maintenance  Topic Date Due  . FOOT EXAM  03/11/1951  . INFLUENZA VACCINE  12/27/2017  . OPHTHALMOLOGY EXAM  03/16/2018  . HEMOGLOBIN A1C  03/19/2018  . TETANUS/TDAP  05/29/2020  . DEXA SCAN  Completed  . PNA vac Low Risk Adult  Completed    Cancer Screenings: Lung: Low Dose CT Chest recommended if Age 44-80 years, 30 pack-year currently smoking OR have quit w/in 15years. Patient does not qualify. Breast Screening: No longer required  Bone Density/DexaNo longer required Colorectal: No longer required  Additional Screenings: Hepatitis C Screening: Does not qualify    Plan:  I have personally reviewed and addressed the Medicare Annual Wellness questionnaire and have noted the following in the patient's chart:  A. Medical and social history B. Use of alcohol, tobacco or illicit drugs  C. Current  medications and supplements D.  Functional ability and status E.  Nutritional status F.  Physical activity G. Advance directives H. List of other physicians I.  Hospitalizations, surgeries, and ER visits in previous 12 months J.  [place] such as hearing and vision if needed, cognitive and depression L. Referrals and appointments  In addition, I have reviewed and discussed with patient certain preventive protocols, quality metrics, and best practice recommendations. A written personalized care plan for preventive services as well as general preventive health recommendations were provided to patient.  Signed,  Aleatha Borer, LPN Nurse Health Advisor  MD Recommendations: Due for Shingrix. Education has been provided regarding the importance of this vaccine. Pt has been advised to call her insurance company to determine her out of pocket expense. Advised she may also receive this vaccine at her local pharmacy or Health Dept. Verbalized acceptance and understanding.  Diabetic Foot Exam: Overdue for diabetic foot exam. Pt has been advised about the importance in completing this exam. Pt is scheduled to complete this exam with Dr. Ronnald Ramp on 03/19/18.

## 2018-01-11 ENCOUNTER — Other Ambulatory Visit: Payer: Self-pay | Admitting: Family Medicine

## 2018-01-11 ENCOUNTER — Other Ambulatory Visit: Payer: Self-pay

## 2018-01-11 MED ORDER — HYDROCHLOROTHIAZIDE 25 MG PO TABS: 50.0000 mg | ORAL_TABLET | Freq: Every day | ORAL | 0 refills | Status: DC

## 2018-01-11 NOTE — Telephone Encounter (Signed)
Pt called in. She is a former pt of Dr. Vicente Masson. Requesting HCTZ refilled until she sees Dr. Ronnald Ramp as a new pt in Oct.  Refilled med until appt per Dr. Ronnald Ramp.  Pt was grateful.

## 2018-03-11 ENCOUNTER — Telehealth: Payer: Self-pay

## 2018-03-11 NOTE — Telephone Encounter (Signed)
FYI: Called to check in on patient who is on schedule for tomorrow. She made appointment last week and Dr.Jones wanted to be sure she is okay and if sounds like Diverticulitis she instructed me to alert patient that she  needs seen today instead. Pain in Hip and Right Lower Abdmn but most pain is with movement. Has been same over weekend. Offered appointment change but patient is okay waiting until tomorrow,

## 2018-03-12 ENCOUNTER — Other Ambulatory Visit: Payer: Self-pay | Admitting: Family Medicine

## 2018-03-12 ENCOUNTER — Ambulatory Visit
Admission: RE | Admit: 2018-03-12 | Discharge: 2018-03-12 | Disposition: A | Discharge status: Home / Self Care | Payer: Medicare Other | Source: Ambulatory Visit | Attending: Family Medicine | Admitting: Family Medicine

## 2018-03-12 ENCOUNTER — Ambulatory Visit (INDEPENDENT_AMBULATORY_CARE_PROVIDER_SITE_OTHER): Payer: Medicare Other | Admitting: Family Medicine

## 2018-03-12 ENCOUNTER — Encounter: Payer: Self-pay | Admitting: Family Medicine

## 2018-03-12 VITALS — BP 130/70 | HR 64 | Ht 65.0 in | Wt 172.0 lb

## 2018-03-12 DIAGNOSIS — M545 Low back pain, unspecified: Secondary | ICD-10-CM

## 2018-03-12 DIAGNOSIS — R1032 Left lower quadrant pain: Secondary | ICD-10-CM | POA: Diagnosis not present

## 2018-03-12 DIAGNOSIS — Z9181 History of falling: Secondary | ICD-10-CM

## 2018-03-12 DIAGNOSIS — M47816 Spondylosis without myelopathy or radiculopathy, lumbar region: Secondary | ICD-10-CM | POA: Diagnosis not present

## 2018-03-12 DIAGNOSIS — R2689 Other abnormalities of gait and mobility: Secondary | ICD-10-CM

## 2018-03-12 DIAGNOSIS — M25552 Pain in left hip: Secondary | ICD-10-CM

## 2018-03-12 DIAGNOSIS — M160 Bilateral primary osteoarthritis of hip: Secondary | ICD-10-CM | POA: Diagnosis not present

## 2018-03-12 MED ORDER — NAPROXEN 250 MG PO TABS: 250.0000 mg | ORAL_TABLET | Freq: Two times a day (BID) | ORAL | 0 refills | Status: DC

## 2018-03-12 NOTE — Progress Notes (Signed)
Date:  03/12/2018   Name:  Angela Pope   DOB:  07-Jul-1940   MRN:  [id number]   Chief Complaint: Abdominal Pain (L) sided abd pain- only hurts when moves- not currently hurting now) Abdominal Pain  This is a new problem. The current episode started in the past 7 days (Wednesday). The onset quality is sudden. The problem occurs 2 to 4 times per day. The problem has been gradually improving. The pain is located in the LLQ. The pain is mild. The quality of the pain is aching. The abdominal pain radiates to the back. Associated symptoms include arthralgias, constipation, diarrhea and myalgias. Pertinent negatives include no anorexia, belching, dysuria, fever, flatus, frequency, headaches, hematochezia, hematuria, melena, nausea, vomiting or weight loss. Associated symptoms comments: Bowel surgery. The pain is aggravated by movement (avoid treadmill). She has tried acetaminophen for the symptoms. The treatment provided mild relief. Prior workup: removable of large polyp. Her past medical history is significant for abdominal surgery. There is no history of colon cancer, Crohn's disease, gallstones, GERD, irritable bowel syndrome, pancreatitis, PUD or ulcerative colitis.     Review of Systems  Constitutional: Negative.  Negative for chills, fatigue, fever, unexpected weight change and weight loss.  HENT: Negative for congestion, ear discharge, ear pain, rhinorrhea, sinus pressure, sneezing and sore throat.   Eyes: Negative for photophobia, pain, discharge, redness and itching.  Respiratory: Negative for cough, shortness of breath, wheezing and stridor.   Cardiovascular: Negative for chest pain, palpitations and leg swelling.  Gastrointestinal: Positive for abdominal pain, constipation and diarrhea. Negative for anorexia, blood in stool, flatus, hematochezia, melena, nausea and vomiting.  Endocrine: Negative for cold intolerance, heat intolerance, polydipsia, polyphagia and polyuria.    Genitourinary: Negative for dysuria, flank pain, frequency, hematuria, menstrual problem, pelvic pain, urgency, vaginal bleeding and vaginal discharge.  Musculoskeletal: Positive for arthralgias and myalgias. Negative for back pain.  Skin: Negative for rash.  Allergic/Immunologic: Negative for environmental allergies and food allergies.  Neurological: Negative for dizziness, weakness, light-headedness, numbness and headaches.  Hematological: Negative for adenopathy. Does not bruise/bleed easily.  Psychiatric/Behavioral: Negative for dysphoric mood. The patient is not nervous/anxious.     Patient Active Problem List   Diagnosis Date Noted  . Osteoarthritis 09/17/2017  . 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 Bilateral lower extremity edema 01/14/2015  . HTN (hypertension) 01/13/2015  . Diabetes mellitus type 2, controlled, without complications ([place]) 27/10/2374  . Hyperlipidemia 01/13/2015  . Overweight (BMI 25.0-29.9) 01/13/2015  . Allergic rhinitis 01/13/2015  . Hx of colonic polyps 01/13/2015  . 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 Vitamin D deficiency 01/13/2015  . FH: heart disease 01/13/2015  . FH: stroke 01/13/2015    Allergies  Allergen Reactions  . Clarithromycin   . Sulfa Antibiotics   . Tetracyclines & Related     Past Surgical History:  Procedure Laterality Date  . COLONOSCOPY  2015  . large intestine surgery    . SMALL INTESTINE SURGERY  2012   polyp that could't be removed    Social History   Tobacco Use  . Smoking status: Former Smoker    Packs/day: 0.25    Years: 10.00    Pack years: 2.50    Types: Cigarettes    Last attempt to quit: 10/18/1965    Years since quitting: 52.4  . Smokeless tobacco: Never Used  . Tobacco comment: smoking cessation materials not required  Substance Use Topics  . Alcohol use:  Not Currently    Alcohol/week: 0.0 standard drinks  . Drug use: No     Medication list has been  reviewed and updated.  Current Meds  Medication Sig  . glucosamine-chondroitin (MAX GLUCOSAMINE CHONDROITIN) 500-400 MG tablet Take 1 tablet by mouth 2 (two) times daily.  Marland Kitchen glucose blood test strip Use as instructed  . hydrochlorothiazide (HYDRODIURIL) 25 MG tablet TAKE 2 TABLETS(50 MG) BY MOUTH DAILY  . lisinopril (PRINIVIL,ZESTRIL) 40 MG tablet Take 1 tablet (40 mg total) by mouth daily.  Marland Kitchen loperamide (IMODIUM) 2 MG capsule Take 2 mg by mouth as needed for diarrhea or loose stools.  . Multiple Vitamin (MULTIVITAMIN) capsule Take 1 capsule by mouth daily.  Marland Kitchen olopatadine (PATANOL) 0.1 % ophthalmic solution Place 1 drop into both eyes 2 (two) times daily as needed for allergies.  . simvastatin (ZOCOR) 40 MG tablet Take 1 tablet (40 mg total) by mouth daily.  Marland Kitchen triamcinolone (NASACORT AQ) 55 MCG/ACT AERO nasal inhaler Place 2 sprays into the nose daily.    PHQ 2/9 Scores 10/31/2017 03/15/2017 10/30/2016 10/19/2015  PHQ - 2 Score 0 0 0 0  PHQ- 9 Score 0 - - -    Physical Exam  Constitutional: She is oriented to person, place, and time. She appears well-developed and well-nourished.  HENT:  Head: Normocephalic.  Right Ear: External ear normal.  Left Ear: External ear normal.  Mouth/Throat: Oropharynx is clear and moist.  Eyes: Pupils are equal, round, and reactive to light. Conjunctivae and EOM are normal. Lids are everted and swept, no foreign bodies found. Left eye exhibits no hordeolum. No foreign body present in the left eye. Right conjunctiva is not injected. Left conjunctiva is not injected. No scleral icterus.  Neck: Normal range of motion. Neck supple. No JVD present. No tracheal deviation present. No thyromegaly present.  Cardiovascular: Normal rate, regular rhythm, normal heart sounds and intact distal pulses. Exam reveals no gallop and no friction rub.  No murmur heard. Pulmonary/Chest: Effort normal and breath sounds normal. No respiratory distress. She has no wheezes. She has no  rales.  Abdominal: Soft. Bowel sounds are normal. She exhibits no mass. There is no hepatosplenomegaly. There is tenderness in the right lower quadrant. There is no rigidity, no rebound, no guarding and no CVA tenderness.    Negative Carnett sign  Musculoskeletal: She exhibits no edema.       Left hip: She exhibits decreased range of motion and tenderness. She exhibits normal strength, no swelling, no crepitus, no deformity and no laceration.       Lumbar back: She exhibits tenderness, bony tenderness and spasm. She exhibits no swelling and no deformity.  Pain in left hip with internal rotation  Lymphadenopathy:    She has no cervical adenopathy.  Neurological: She is alert and oriented to person, place, and time. She has normal strength. She displays normal reflexes. No cranial nerve deficit.  Skin: Skin is warm. No rash noted.  Psychiatric: She has a normal mood and affect. Her mood appears not anxious. She does not exhibit a depressed mood.  Nursing note and vitals reviewed.   BP 130/70   Pulse 64   Ht 5\' 5"  (1.651 m)   Wt 172 lb (78 kg)   BMI 28.62 kg/m   Assessment and Plan:  1. Left lower quadrant abdominal pain Acute. LLQ/inguinal area. Has history of abdominal surgery with removal sigmoid portion.  - CBC with Differential/Platelet  2. Pain of left hip joint Exan notes tenderness in  left inguinal area with pain  On internal rotation. 4 view x-ray of left hip. - naproxen (NAPROSYN) 250 MG tablet; Take 1 tablet (250 mg total) by mouth 2 (two) times daily with a meal.  Dispense: 60 tablet; Refill: 0  3. Lumbar back pain Paraspinal spasm with tenderness / X ray of lumbosacral area. - DG Lumbar Spine Complete; Future - naproxen (NAPROSYN) 250 MG tablet; Take 1 tablet (250 mg total) by mouth 2 (two) times daily with a meal.  Dispense: 60 tablet; Refill: 0  4. At moderate risk for fall Daughter would like fall risk assestment with physical therapy. - Ambulatory referral to  Physical Therapy  5. Balance problem Patient uses cane and relate "always had problem with my balance." - Ambulatory referral to Physical Therapy I spent 60 minutes with this patient, More than 50% of that time was spent in face to face education, counseling and care coordination.  Dr. [facility]  03/12/2018

## 2018-03-13 LAB — CBC WITH DIFFERENTIAL/PLATELET
BASOS ABS: 0 10*3/uL (ref 0.0–0.2)
Basos: 1 %
EOS (ABSOLUTE): 0.1 10*3/uL (ref 0.0–0.4)
Eos: 1 %
Hematocrit: 47.1 % — ABNORMAL HIGH (ref 34.0–46.6)
Hemoglobin: 16.1 g/dL — ABNORMAL HIGH (ref 11.1–15.9)
IMMATURE GRANULOCYTES: 0 %
Immature Grans (Abs): 0 10*3/uL (ref 0.0–0.1)
Lymphocytes Absolute: 1.8 10*3/uL (ref 0.7–3.1)
Lymphs: 29 %
MCH: 31.3 pg (ref 26.6–33.0)
MCHC: 34.2 g/dL (ref 31.5–35.7)
MCV: 92 fL (ref 79–97)
MONOS ABS: 0.5 10*3/uL (ref 0.1–0.9)
Monocytes: 8 %
NEUTROS PCT: 61 %
Neutrophils Absolute: 3.9 10*3/uL (ref 1.4–7.0)
Platelets: 315 10*3/uL (ref 150–450)
RBC: 5.15 x10E6/uL (ref 3.77–5.28)
RDW: 12.3 % (ref 12.3–15.4)
WBC: 6.3 10*3/uL (ref 3.4–10.8)

## 2018-03-18 ENCOUNTER — Ambulatory Visit (INDEPENDENT_AMBULATORY_CARE_PROVIDER_SITE_OTHER): Payer: Medicare Other

## 2018-03-18 DIAGNOSIS — Z23 Encounter for immunization: Secondary | ICD-10-CM | POA: Diagnosis not present

## 2018-03-19 ENCOUNTER — Ambulatory Visit: Payer: Medicare Other | Attending: Family Medicine | Admitting: Physical Therapy

## 2018-03-19 ENCOUNTER — Ambulatory Visit: Payer: Medicare Other | Admitting: Family Medicine

## 2018-03-19 ENCOUNTER — Encounter: Payer: Self-pay | Admitting: Physical Therapy

## 2018-03-19 DIAGNOSIS — Z9181 History of falling: Secondary | ICD-10-CM

## 2018-03-19 DIAGNOSIS — R2689 Other abnormalities of gait and mobility: Secondary | ICD-10-CM | POA: Insufficient documentation

## 2018-03-19 DIAGNOSIS — M545 Low back pain, unspecified: Secondary | ICD-10-CM

## 2018-03-19 NOTE — Therapy (Signed)
[place] [facility] [address] [place], [place], [address] Phone: [phone]   Fax:  [phone]  Physical Therapy Evaluation  Patient Details  Name: Angela Pope MRN: [id number] Date of Birth: [birth year] Referring Provider (PT): Otilio Miu   Encounter Date: 03/19/2018  PT End of Session - 03/19/18 1755    Visit Number  1    Number of Visits  5    Date for PT Re-Evaluation  04/16/18    PT Start Time  5573    PT Stop Time  1610    PT Time Calculation (min)  55 min    Equipment Utilized During Treatment  Gait belt    Activity Tolerance  Patient tolerated treatment well    Behavior During Therapy  [facility] for tasks assessed/performed       Past Medical History:  Diagnosis Date  . Diabetes mellitus without complication ([place])    controls with diet  . Hyperlipidemia   . Hypertension     Past Surgical History:  Procedure Laterality Date  . COLONOSCOPY  2015  . large intestine surgery    . SMALL INTESTINE SURGERY  2012   polyp that could't be removed    There were no vitals filed for this visit.   Subjective Assessment - 03/19/18 1751    Subjective  Patient presents to clinic with complaints of mild discomfort in her lumbar spine and R flank which she states has resolved almost completely. Patient also presents per the request of her daughter to learn strategies for balance.    Pertinent History  Patient     Limitations  Walking;Lifting;Standing;House hold activities    How long can you sit comfortably?  Unlimited with back support, 20 min without     How long can you stand comfortably?  Pt reports no duration limitation    How long can you walk comfortably?  Pt reports no duration limitations    Patient Stated Goals  Learn strategies to avoid tripping    Currently in Pain?  Yes    Pain Score  4     Pain Location  Back    Pain Orientation  Lower;Right    Pain Descriptors / Indicators  Aching    Pain Type  Acute pain    Pain Onset  1 to 4 weeks ago    Pain Frequency  Intermittent    Aggravating Factors   "stooping"    Effect of Pain on Daily Activities  None reported      SUBJECTIVE Chief complaint: Patient reports having had back pain as a result of flexion-rotation during ADLs (reaching into refridgerator while turning to look back). Patient also states that her daughter asked her to be screened for balance concerns. Patient notes that she has been clumsy her entire life, she has issues with depth perception and has fallen during curb negotiation previously (~18 mos prior). Onset: 3 weeks prior (LBP), radiating to L hip >R initially, R flank is now more painful 3/10 currently, originally 6/10; Patient states balance has been a problem her entire life. Imaging: Xrays, normal age appropriate changes Recent changes in overall health/medication: No Directional pattern for falls: forward 2/2 to depth perception issues Prior history of physical therapy for balance: None Follow-up appointment with MD: None scheduled Red flags (bowel/bladder changes, saddle paresthesia, personal history of cancer, chills/fever, night sweats, unrelenting pain) Negative; Pt reports urgency with bowel movements as a result of previous surgery on colon.   OBJECTIVE  MUSCULOSKELETAL: Tremor:  Absent Bulk: Normal Tone: Normal, no clonus  Posture No gross abnormalities noted in standing or seated posture  Spine AROM Limited throughout with mild discomfort with R-rotation, R flexion-rotation, R-extension-rotation, and B lateral flexion. Patient stated multiple times when assessing AROM of the spine that she is not concerned about her back and probably should've cancelled the appointment.  Gait Patient walks with narrow base of support, limited arm swing, and occasionally appears unsteady when engaged dual task, but did not have a LOB.  Strength R/L 4/4 Hip flexion 4/4 Hip abduction 4/4 Hip adduction 4/4 Knee extension 4/4  Knee flexion 4/4 Ankle Plantarflexion (seated) 4/4 Ankle Dorsiflexion   NEUROLOGICAL:  Mental Status Patient is oriented to person, place and time.  Recent memory is intact.  Remote memory is intact.  Attention span and concentration are intact.  Expressive speech is intact.  Patient's fund of knowledge is within normal limits for educational level.   Sensation Grossly intact to light touch bilateral UEs/LEs as determined by testing dermatomes L2-S2 respectively Proprioception and hot/cold testing deferred on this date  Reflexes R/L 2+/2+ Knee Jerk (L3/4) 2+/2+ Ankle Jerk (S1/2)  Coordination/Cerebellar Finger to Nose: WNL Heel to Shin: WNL Rapid alternating movements: WNL   FUNCTIONAL OUTCOME MEASURES   Results Comments  BERG 43/56 Fall risk, in need of intervention  DGI 17/24 Fall risk  FGA /30 deferred  TUG 13.7 seconds Appropriate for community ambulation  5TSTS 19.4 seconds Fall risk       ASSESSMENT Clinical Impression: Pt is a pleasant 77 year-old female referred for difficulty with balance. PT examination reveals deficits in dynamic balance activities requiring negotiation of obstacles, dual task, and head turns. Pt presents with deficits in strength, gait and balance that may be impacted by her age, vision, and hx of falls. Pt will benefit from skilled PT services to address deficits in balance and decrease risk for future falls.   Low (stable): no personal factors/comorbidities, 1-2 body systems/activity limitations/participation restrictions      PLAN Next Visit: balance exercises with dual task and obstacle negotiation HEP: selected exercises from the [place] series. Patient does not and will not perform knee bends as exercises, but had no complaints when doing sit<>stand.    [facility] PT Assessment - 03/19/18 0001      Assessment   Medical Diagnosis  Impaired balance    Referring Provider (PT)  Tilda Burrow Ronnald Ramp    Onset Date/Surgical Date  09/17/16     Hand Dominance  Right    Prior Therapy  None         Objective measurements completed on examination: See above findings.    PT Education - 03/19/18 1754    Education Details  HEP, basic falls prevention at home    Person(s) Educated  Patient    Methods  Explanation;Demonstration;Verbal cues;Handout    Comprehension  Verbalized understanding;Returned demonstration;Need further instruction          PT Long Term Goals - 03/19/18 1821      PT LONG TERM GOAL #1   Title  Pt will be independent with HEP in order to improve strength and balance in order to decrease fall risk and improve function at home and work.     Baseline  not initiated    Time  4    Period  Weeks    Status  New    Target Date  04/16/18      PT LONG TERM GOAL #2   Title  Pt will decrease  5TSTS to <15 seconds in order to demonstrate clinically significant improvement in LE strength and decreased risk for falls.    Baseline  10/22: 19.4 sec    Time  4    Period  Weeks    Status  New    Target Date  04/16/18      PT LONG TERM GOAL #3   Title  Patient will report low back pain on NPRS to be at 0/10 to demonstrate return to PLOF.    Baseline  4/10    Time  4    Period  Weeks    Status  New    Target Date  04/16/18      PT LONG TERM GOAL #4   Title  Pt will improve BERG to >50 points in order to demonstrate clinically significant improvement in balance.    Baseline  43/56    Time  4    Period  Weeks    Target Date  04/16/18      PT LONG TERM GOAL #5   Title  Pt will improve DGI by at least 3 points in order to demonstrate clinically significant improvement in balance and decreased risk for falls.    Baseline  17/24    Time  4    Period  Weeks    Status  New    Target Date  04/16/18        Plan - 03/19/18 1815    Clinical Impression Statement  Clinical Impression: Pt is a pleasant 77 year-old female referred for difficulty with balance. PT examination reveals deficits in dynamic balance  activities requiring negotiation of obstacles, dual task, and head turns. Pt presents with deficits in strength, gait and balance that may be impacted by her age, vision, and hx of falls. Pt will benefit from skilled PT services to address deficits in balance and decrease risk for future falls.     History and Personal Factors relevant to plan of care:  single-story home, family support nearby, hx of falls without reporting, regularly participates in wellness/fitness activities    Clinical Presentation  Stable    Clinical Presentation due to:  Outcome scores are just below threshold for fall risk; pt is aware of balance issues, all other health concerns are well-managed    Clinical Decision Making  Low    Rehab Potential  Good    PT Frequency  1x / week    PT Duration  4 weeks    PT Treatment/Interventions  Canalith Repostioning;Electrical Stimulation;Moist Heat;Ultrasound;Gait training;Stair training;Functional mobility training;Therapeutic activities;Therapeutic exercise;Balance training;Neuromuscular re-education;Patient/family education;Manual techniques;Passive range of motion;Energy conservation;Taping;Vestibular;Visual/perceptual remediation/compensation;Joint Manipulations;Spinal Manipulations    PT Next Visit Plan  Dynamic balance with dual task, teach balance strategies    PT Home Exercise Plan  [place] Exercises (at counter)    Consulted and Agree with Plan of Care  Patient       Patient will benefit from skilled therapeutic intervention in order to improve the following deficits and impairments:  Abnormal gait, Decreased strength, Pain, Improper body mechanics, Decreased balance, Decreased mobility, Decreased safety awareness, Difficulty walking, Impaired vision/preception  Visit Diagnosis: Other abnormalities of gait and mobility  Acute bilateral low back pain without sciatica  History of falling     Problem List Patient Active Problem List   Diagnosis Date Noted  .  Osteoarthritis 09/17/2017  . 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 Bilateral lower extremity edema 01/14/2015  . HTN (hypertension) 01/13/2015  . Diabetes mellitus type 2, controlled, without complications ([place])  01/13/2015  . Hyperlipidemia 01/13/2015  . Overweight (BMI 25.0-29.9) 01/13/2015  . Allergic rhinitis 01/13/2015  . Hx of colonic polyps 01/13/2015  . 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 Vitamin D deficiency 01/13/2015  . FH: heart disease 01/13/2015  . FH: stroke 01/13/2015    Myles Gip PT, DPT [phone] 03/19/2018, 6:25 PM  [place] [facility] [address] [place], [place], [address] Phone: [phone]   Fax:  [phone]  Name: Angela Pope MRN: [id number] Date of Birth: 12/30/1940

## 2018-03-25 ENCOUNTER — Ambulatory Visit: Payer: Medicare Other | Admitting: Physical Therapy

## 2018-03-25 ENCOUNTER — Encounter: Payer: Self-pay | Admitting: Physical Therapy

## 2018-03-25 DIAGNOSIS — R2689 Other abnormalities of gait and mobility: Secondary | ICD-10-CM | POA: Diagnosis not present

## 2018-03-25 DIAGNOSIS — Z9181 History of falling: Secondary | ICD-10-CM | POA: Diagnosis not present

## 2018-03-25 DIAGNOSIS — M545 Low back pain, unspecified: Secondary | ICD-10-CM

## 2018-03-25 NOTE — Therapy (Signed)
[place] [facility] [address]. [place], [place], [address] Phone: [phone]   Fax:  [phone]  Physical Therapy Treatment  Patient Details  Name: Angela Pope MRN: [id number] Date of Birth: [birth year] Referring Provider (PT): Otilio Miu   Encounter Date: 03/25/2018  PT End of Session - 03/25/18 1125    Visit Number  2    Number of Visits  5    Date for PT Re-Evaluation  04/16/18    PT Start Time  1103    PT Stop Time  1200    PT Time Calculation (min)  57 min    Equipment Utilized During Treatment  Gait belt    Activity Tolerance  Patient tolerated treatment well    Behavior During Therapy  [facility] for tasks assessed/performed       Past Medical History:  Diagnosis Date  . Diabetes mellitus without complication ([place])    controls with diet  . Hyperlipidemia   . Hypertension     Past Surgical History:  Procedure Laterality Date  . COLONOSCOPY  2015  . large intestine surgery    . SMALL INTESTINE SURGERY  2012   polyp that could't be removed    There were no vitals filed for this visit.  Subjective Assessment - 03/25/18 1211    Subjective  Patient states that she has been doing her HEP 2x/d and feels her back is improving. She reports no falls or significant LOBs since her last visit.    Pertinent History  Patient     Limitations  Walking;Lifting;Standing;House hold activities    How long can you sit comfortably?  Unlimited with back support, 20 min without     How long can you stand comfortably?  Pt reports no duration limitation    How long can you walk comfortably?  Pt reports no duration limitations    Patient Stated Goals  Learn strategies to avoid tripping    Currently in Pain?  Yes    Pain Score  2     Pain Location  Back    Pain Orientation  Right;Lower    Pain Descriptors / Indicators  Discomfort    Pain Onset  1 to 4 weeks ago    Pain Frequency  Intermittent    Multiple Pain Sites  No       TREATMENT Neuromuscular Re-education:  Walking with head turns (horizontal and vertical) and object identification, SBA, x6 laps in hall. Patient requires VCs to avoid using peripheral vision compensations.   // bars:  Lateral walking on airex balance beam, faded UE support, SBA, VCs for erect posture and slow, controlled movement. x5 laps Tandem walking, level surface, faded UE support, SBA, dual task engagement (talking). x5 laps Tandem walking, airex balance beam, faded UE support, SBA, VCs for posture, dual task engagement. x5 laps Obstacle negotiation, level surface, no UE support, SBA, x5 laps Static standing w/ narrow base, level surface and ball tosses, SBA, x10 Lateral walking, level surface, ball tosses, SBA, x10  Treadmill walking, LUE support, level 0, speed 0.5, VCs for stride length, and wider BOS. 5 min.  Patient educated on progressing HEP and balance strategies to ensure safety and decrease risk of falls.    PT Long Term Goals - 03/19/18 1821      PT LONG TERM GOAL #1   Title  Pt will be independent with HEP in order to improve strength and balance in order to decrease fall risk and improve function at home  and work.     Baseline  not initiated    Time  4    Period  Weeks    Status  New    Target Date  04/16/18      PT LONG TERM GOAL #2   Title  Pt will decrease 5TSTS to <15 seconds in order to demonstrate clinically significant improvement in LE strength and decreased risk for falls.    Baseline  10/22: 19.4 sec    Time  4    Period  Weeks    Status  New    Target Date  04/16/18      PT LONG TERM GOAL #3   Title  Patient will report low back pain on NPRS to be at 0/10 to demonstrate return to PLOF.    Baseline  4/10    Time  4    Period  Weeks    Status  New    Target Date  04/16/18      PT LONG TERM GOAL #4   Title  Pt will improve BERG to >50 points in order to demonstrate clinically significant improvement in balance.    Baseline  43/56    Time   4    Period  Weeks    Target Date  04/16/18      PT LONG TERM GOAL #5   Title  Pt will improve DGI by at least 3 points in order to demonstrate clinically significant improvement in balance and decreased risk for falls.    Baseline  17/24    Time  4    Period  Weeks    Status  New    Target Date  04/16/18            Plan - 03/25/18 1214    Clinical Impression Statement Patient demonstrates deficits in balance as evidenced by her slowed gait speed during dual task negotiation, navigating obstacles, and reliance on UE support/tactile feedback during intermediate dynamic balance activties. Patient will benefit from continued skilled therapeutic intervention to address deficits in balance in order to return to PLOF and decrease risk of falls.    Rehab Potential  Good    PT Frequency  1x / week    PT Duration  4 weeks    PT Treatment/Interventions  Canalith Repostioning;Electrical Stimulation;Moist Heat;Ultrasound;Gait training;Stair training;Functional mobility training;Therapeutic activities;Therapeutic exercise;Balance training;Neuromuscular re-education;Patient/family education;Manual techniques;Passive range of motion;Energy conservation;Taping;Vestibular;Visual/perceptual remediation/compensation;Joint Manipulations;Spinal Manipulations    PT Next Visit Plan  Dynamic balance with dual task, teach balance strategies    PT Home Exercise Plan  [place] Exercises (at counter)    Consulted and Agree with Plan of Care  Patient       Patient will benefit from skilled therapeutic intervention in order to improve the following deficits and impairments:  Abnormal gait, Decreased strength, Pain, Improper body mechanics, Decreased balance, Decreased mobility, Decreased safety awareness, Difficulty walking, Impaired vision/preception  Visit Diagnosis: Other abnormalities of gait and mobility  Acute bilateral low back pain without sciatica  History of falling     Problem List Patient  Active Problem List   Diagnosis Date Noted  . Osteoarthritis 09/17/2017  . 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 Bilateral lower extremity edema 01/14/2015  . HTN (hypertension) 01/13/2015  . Diabetes mellitus type 2, controlled, without complications ([place]) 33/00/7622  . Hyperlipidemia 01/13/2015  . Overweight (BMI 25.0-29.9) 01/13/2015  . Allergic rhinitis 01/13/2015  . Hx of colonic polyps 01/13/2015  . 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 Chronic  right hip pain 01/13/2015  . Vitamin D deficiency 01/13/2015  . FH: heart disease 01/13/2015  . FH: stroke 01/13/2015   Myles Gip PT, DPT [phone] 03/25/2018, 12:18 PM  [place] [facility] [address] [place], [place], [address] Phone: [phone]   Fax:  [phone]  Name: Angela Pope MRN: [id number] Date of Birth: 1940/11/06

## 2018-03-27 ENCOUNTER — Ambulatory Visit (INDEPENDENT_AMBULATORY_CARE_PROVIDER_SITE_OTHER): Payer: Medicare Other | Admitting: Family Medicine

## 2018-03-27 ENCOUNTER — Encounter: Payer: Self-pay | Admitting: Family Medicine

## 2018-03-27 ENCOUNTER — Other Ambulatory Visit: Payer: Self-pay | Admitting: Family Medicine

## 2018-03-27 VITALS — BP 130/68 | HR 72 | Ht 65.0 in | Wt 171.0 lb

## 2018-03-27 DIAGNOSIS — Z1239 Encounter for other screening for malignant neoplasm of breast: Secondary | ICD-10-CM

## 2018-03-27 DIAGNOSIS — E785 Hyperlipidemia, unspecified: Secondary | ICD-10-CM

## 2018-03-27 DIAGNOSIS — Z0000 Encounter for general adult medical examination without abnormal findings: Secondary | ICD-10-CM

## 2018-03-27 DIAGNOSIS — E559 Vitamin D deficiency, unspecified: Secondary | ICD-10-CM

## 2018-03-27 DIAGNOSIS — R600 Localized edema: Secondary | ICD-10-CM | POA: Diagnosis not present

## 2018-03-27 DIAGNOSIS — E119 Type 2 diabetes mellitus without complications: Secondary | ICD-10-CM | POA: Diagnosis not present

## 2018-03-27 DIAGNOSIS — Z1231 Encounter for screening mammogram for malignant neoplasm of breast: Secondary | ICD-10-CM | POA: Diagnosis not present

## 2018-03-27 DIAGNOSIS — R69 Illness, unspecified: Secondary | ICD-10-CM | POA: Diagnosis not present

## 2018-03-27 NOTE — Progress Notes (Addendum)
Date:  03/27/2018   Name:  Angela Pope   DOB:  [birth year]   MRN:  [id number]   Chief Complaint: Annual Exam (level 4/ breast exam) Patient is a 77 year old female who presents for a comprehensive physical exam. The patient reports the following problems: none back better/balance improved. Health maintenance has been reviewed diabetic foot exam.    Review of Systems  Constitutional: Negative.  Negative for chills, fatigue, fever and unexpected weight change.  HENT: Negative for congestion, ear discharge, ear pain, rhinorrhea, sinus pressure, sneezing and sore throat.   Eyes: Negative for photophobia, pain, discharge, redness and itching.  Respiratory: Negative for cough, shortness of breath, wheezing and stridor.   Cardiovascular: Negative for chest pain and leg swelling.  Gastrointestinal: Negative for abdominal pain, blood in stool, constipation, diarrhea, nausea and vomiting.  Endocrine: Negative for cold intolerance, heat intolerance, polydipsia, polyphagia and polyuria.  Genitourinary: Negative for dysuria, flank pain, frequency, hematuria, menstrual problem, pelvic pain, urgency, vaginal bleeding and vaginal discharge.  Musculoskeletal: Negative for arthralgias, back pain and myalgias.  Skin: Negative for color change, pallor and rash.  Allergic/Immunologic: Negative for environmental allergies and food allergies.  Neurological: Negative for dizziness, weakness, light-headedness, numbness and headaches.  Hematological: Negative for adenopathy. Does not bruise/bleed easily.  Psychiatric/Behavioral: Negative for dysphoric mood. The patient is not nervous/anxious.     Patient Active Problem List   Diagnosis Date Noted  . Osteoarthritis 09/17/2017  . 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 Bilateral lower extremity edema 01/14/2015  . HTN (hypertension) 01/13/2015  . Diabetes mellitus type 2, controlled, without complications ([place]) 03/28/5944  . Hyperlipidemia 01/13/2015  .  Overweight (BMI 25.0-29.9) 01/13/2015  . Allergic rhinitis 01/13/2015  . Hx of colonic polyps 01/13/2015  . 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 Vitamin D deficiency 01/13/2015  . FH: heart disease 01/13/2015  . FH: stroke 01/13/2015    Allergies  Allergen Reactions  . Clarithromycin   . Sulfa Antibiotics   . Tetracyclines & Related     Past Surgical History:  Procedure Laterality Date  . COLONOSCOPY  2015  . large intestine surgery    . SMALL INTESTINE SURGERY  2012   polyp that could't be removed    Social History   Tobacco Use  . Smoking status: Former Smoker    Packs/day: 0.25    Years: 10.00    Pack years: 2.50    Types: Cigarettes    Last attempt to quit: 10/18/1965    Years since quitting: 52.4  . Smokeless tobacco: Never Used  . Tobacco comment: smoking cessation materials not required  Substance Use Topics  . Alcohol use: Not Currently    Alcohol/week: 0.0 standard drinks  . Drug use: No     Medication list has been reviewed and updated.  Current Meds  Medication Sig  . glucosamine-chondroitin (MAX GLUCOSAMINE CHONDROITIN) 500-400 MG tablet Take 1 tablet by mouth 2 (two) times daily.  Marland Kitchen glucose blood test strip Use as instructed  . hydrochlorothiazide (HYDRODIURIL) 25 MG tablet TAKE 2 TABLETS(50 MG) BY MOUTH DAILY  . lisinopril (PRINIVIL,ZESTRIL) 40 MG tablet Take 1 tablet (40 mg total) by mouth daily.  Marland Kitchen loperamide (IMODIUM) 2 MG capsule Take 2 mg by mouth as needed for diarrhea or loose stools.  . Multiple Vitamin (MULTIVITAMIN) capsule Take 1 capsule by mouth daily.  . naproxen (NAPROSYN) 250 MG tablet Take 1 tablet (250 mg total) by mouth 2 (  two) times daily with a meal.  . olopatadine (PATANOL) 0.1 % ophthalmic solution Place 1 drop into both eyes 2 (two) times daily as needed for allergies.  . simvastatin (ZOCOR) 40 MG tablet Take 1 tablet (40 mg total) by mouth daily.  Marland Kitchen triamcinolone (NASACORT AQ) 55  MCG/ACT AERO nasal inhaler Place 2 sprays into the nose daily.    PHQ 2/9 Scores 10/31/2017 03/15/2017 10/30/2016 10/19/2015  PHQ - 2 Score 0 0 0 0  PHQ- 9 Score 0 - - -    Physical Exam  Constitutional: She is oriented to person, place, and time. Vital signs are normal. She appears well-developed and well-nourished. No distress.  HENT:  Head: Normocephalic and atraumatic.  Right Ear: Hearing, tympanic membrane, external ear and ear canal normal.  Left Ear: Hearing, tympanic membrane, external ear and ear canal normal.  Nose: Nose normal. No mucosal edema or rhinorrhea.  Mouth/Throat: Uvula is midline and oropharynx is clear and moist. No oropharyngeal exudate, posterior oropharyngeal edema or posterior oropharyngeal erythema.  Eyes: Pupils are equal, round, and reactive to light. Conjunctivae, EOM and lids are normal. Lids are everted and swept, no foreign bodies found. Right eye exhibits no discharge. Left eye exhibits no discharge and no hordeolum. No foreign body present in the left eye. Right conjunctiva is not injected. Left conjunctiva is not injected. No scleral icterus. Right pupil is round and reactive. Left pupil is round and reactive. Pupils are equal.  Fundoscopic exam:      The right eye shows no AV nicking.  Neck: Trachea normal, normal range of motion and full passive range of motion without pain. Neck supple. Normal carotid pulses, no hepatojugular reflux and no JVD present. Carotid bruit is not present. No tracheal deviation present. No thyroid mass and no thyromegaly present.  Cardiovascular: Normal rate, regular rhythm, S1 normal, S2 normal, normal heart sounds, intact distal pulses and normal pulses. PMI is not displaced. Exam reveals no gallop, no S3, no S4, no distant heart sounds and no friction rub.  No murmur heard.  No systolic murmur is present.  No diastolic murmur is present. Pulses:      Carotid pulses are 2+ on the right side, and 2+ on the left side.      Radial  pulses are 2+ on the right side, and 2+ on the left side.       Femoral pulses are 2+ on the right side, and 2+ on the left side.      Popliteal pulses are 2+ on the right side, and 2+ on the left side.       Dorsalis pedis pulses are 2+ on the right side, and 2+ on the left side.       Posterior tibial pulses are 2+ on the right side, and 2+ on the left side.  Pulmonary/Chest: Effort normal and breath sounds normal. No stridor. No respiratory distress. She has no decreased breath sounds. She has no wheezes. She has no rhonchi. She has no rales. She exhibits no mass. Right breast exhibits no inverted nipple, no mass, no nipple discharge, no skin change and no tenderness. Left breast exhibits no inverted nipple, no mass, no nipple discharge, no skin change and no tenderness. No breast swelling, tenderness, discharge or bleeding. Breasts are symmetrical.  Abdominal: Soft. Normal appearance, normal aorta and bowel sounds are normal. She exhibits no mass. There is no hepatosplenomegaly. There is no tenderness. There is no rebound and no guarding.  Musculoskeletal: Normal range of  motion. She exhibits no edema or tenderness.       Cervical back: Normal.       Thoracic back: Normal.       Lumbar back: Normal.       Right foot: There is deformity. There is normal range of motion.       Left foot: There is normal range of motion and no deformity.  Feet:  Right Foot:  Protective Sensation: 10 sites tested. 10 sites sensed.  Skin Integrity: Negative for ulcer, blister, skin breakdown, erythema, warmth, callus or dry skin.  Left Foot:  Protective Sensation: 10 sites tested. 10 sites sensed.  Skin Integrity: Negative for ulcer, blister, skin breakdown, erythema, warmth, callus or dry skin.  Lymphadenopathy:       Head (right side): No submental and no submandibular adenopathy present.       Head (left side): No submental and no submandibular adenopathy present.    She has no cervical adenopathy.    She  has no axillary adenopathy.  Neurological: She is alert and oriented to person, place, and time. She has normal strength and normal reflexes. She displays normal reflexes. No cranial nerve deficit or sensory deficit.  Reflex Scores:      Tricep reflexes are 2+ on the right side and 2+ on the left side.      Bicep reflexes are 2+ on the right side and 2+ on the left side.      Brachioradialis reflexes are 2+ on the right side and 2+ on the left side.      Patellar reflexes are 2+ on the right side and 2+ on the left side.      Achilles reflexes are 2+ on the right side and 2+ on the left side. Skin: Skin is warm, dry and intact. Capillary refill takes less than 2 seconds. No rash noted. She is not diaphoretic. No pallor.  Psychiatric: She has a normal mood and affect. Her mood appears not anxious. She does not exhibit a depressed mood.  Nursing note and vitals reviewed.   BP 130/68   Pulse 72   Ht 5\' 5"  (1.651 m)   Wt 171 lb (77.6 kg)   BMI 28.46 kg/m   Assessment and Plan:  1. Yearly Maintenance exam Per patient request annual physical done. - CBC with Differential/Platelet  2. Controlled type 2 diabetes mellitus without complication, without long-term current use of insulin (HCC) Chronic Stable Controlled. Cont low carb/ no concentrated sugar. Foot exam done - Hemoglobin A1c - Renal Function Panel  3. Vitamin D deficiency Vit D 800 units.  4. Bilateral lower extremity edema Bilteral leg edema. Left>Right (17.5vs15) Referral to Vein and Vascular. - Ambulatory referral to Vascular Surgery - CBC with Differential/Platelet  5. Hyperlipidemia, unspecified hyperlipidemia type Chronic Srable Cont ZOCOR - CBC with Differential/Platelet - Lipid panel  6. Breast cancer screening Discussed and patient desired mammogram  7. Taking medication for chronic disease Check hepatic panel on statin. - CBC with Differential/Platelet - Hepatic Function Panel (6)  8. Visit for screening  mammogram See above - MM 3D SCREEN BREAST BILATERAL; Future  mammo sched for 04/09/18 @ 11:40 in [address] [address]  03/27/2018

## 2018-03-28 LAB — RENAL FUNCTION PANEL
ALBUMIN: 4.2 g/dL (ref 3.5–4.8)
BUN/Creatinine Ratio: 17 (ref 12–28)
BUN: 14 mg/dL (ref 8–27)
CHLORIDE: 103 mmol/L (ref 96–106)
CO2: 24 mmol/L (ref 20–29)
Calcium: 9.9 mg/dL (ref 8.7–10.3)
Creatinine, Ser: 0.83 mg/dL (ref 0.57–1.00)
GFR calc Af Amer: 79 mL/min/{1.73_m2} (ref >59)
GFR, EST NON AFRICAN AMERICAN: 68 mL/min/{1.73_m2} (ref >59)
GLUCOSE: 121 mg/dL — AB (ref 65–99)
PHOSPHORUS: 3.8 mg/dL (ref 2.5–4.5)
POTASSIUM: 3.6 mmol/L (ref 3.5–5.2)
Sodium: 144 mmol/L (ref 134–144)

## 2018-03-28 LAB — CBC WITH DIFFERENTIAL/PLATELET
BASOS ABS: 0.1 10*3/uL (ref 0.0–0.2)
BASOS: 1 %
EOS (ABSOLUTE): 0.2 10*3/uL (ref 0.0–0.4)
Eos: 2 %
Hematocrit: 48 % — ABNORMAL HIGH (ref 34.0–46.6)
Hemoglobin: 16.1 g/dL — ABNORMAL HIGH (ref 11.1–15.9)
IMMATURE GRANS (ABS): 0 10*3/uL (ref 0.0–0.1)
IMMATURE GRANULOCYTES: 0 %
LYMPHS: 25 %
Lymphocytes Absolute: 1.7 10*3/uL (ref 0.7–3.1)
MCH: 31.1 pg (ref 26.6–33.0)
MCHC: 33.5 g/dL (ref 31.5–35.7)
MCV: 93 fL (ref 79–97)
Monocytes Absolute: 0.6 10*3/uL (ref 0.1–0.9)
Monocytes: 9 %
NEUTROS PCT: 63 %
Neutrophils Absolute: 4.1 10*3/uL (ref 1.4–7.0)
PLATELETS: 319 10*3/uL (ref 150–450)
RBC: 5.17 x10E6/uL (ref 3.77–5.28)
RDW: 12.4 % (ref 12.3–15.4)
WBC: 6.6 10*3/uL (ref 3.4–10.8)

## 2018-03-28 LAB — HEMOGLOBIN A1C
Est. average glucose Bld gHb Est-mCnc: 140 mg/dL
Hgb A1c MFr Bld: 6.5 % — ABNORMAL HIGH (ref 4.8–5.6)

## 2018-03-28 LAB — HEPATIC FUNCTION PANEL (6)
ALT: 20 IU/L (ref 0–32)
AST: 24 IU/L (ref 0–40)
Alkaline Phosphatase: 65 IU/L (ref 39–117)
Bilirubin Total: 1.9 mg/dL — ABNORMAL HIGH (ref 0.0–1.2)
Bilirubin, Direct: 0.32 mg/dL (ref 0.00–0.40)

## 2018-03-28 LAB — LIPID PANEL
Chol/HDL Ratio: 3.4 ratio (ref 0.0–4.4)
Cholesterol, Total: 166 mg/dL (ref 100–199)
HDL: 49 mg/dL (ref >39)
LDL Calculated: 85 mg/dL (ref 0–99)
Triglycerides: 162 mg/dL — ABNORMAL HIGH (ref 0–149)
VLDL CHOLESTEROL CAL: 32 mg/dL (ref 5–40)

## 2018-03-28 NOTE — Addendum Note (Signed)
Addended by: Juline Patch on: 03/28/2018 09:59 AM   Modules accepted: Level of Service

## 2018-04-01 ENCOUNTER — Encounter: Payer: Self-pay | Admitting: Physical Therapy

## 2018-04-01 ENCOUNTER — Ambulatory Visit: Payer: Medicare Other | Attending: Family Medicine | Admitting: Physical Therapy

## 2018-04-01 DIAGNOSIS — Z9181 History of falling: Secondary | ICD-10-CM | POA: Insufficient documentation

## 2018-04-01 DIAGNOSIS — M545 Low back pain, unspecified: Secondary | ICD-10-CM

## 2018-04-01 DIAGNOSIS — R2689 Other abnormalities of gait and mobility: Secondary | ICD-10-CM | POA: Diagnosis not present

## 2018-04-01 NOTE — Therapy (Signed)
[facility] [address] [place], [place], [address] Phone: [phone]   Fax:  [phone]  Physical Therapy Treatment  Patient Details  Name: Angela Pope MRN: [id number] Date of Birth: Aug 09, 1940 Referring Provider (PT): Otilio Miu   Encounter Date: 04/01/2018  PT End of Session - 04/01/18 1321    Visit Number  3    Number of Visits  5    Date for PT Re-Evaluation  04/16/18    PT Start Time  1027    PT Stop Time  1125    PT Time Calculation (min)  58 min    Equipment Utilized During Treatment  Gait belt    Activity Tolerance  Patient tolerated treatment well    Behavior During Therapy  [facility] for tasks assessed/performed       Past Medical History:  Diagnosis Date  . Diabetes mellitus without complication ([place])    controls with diet  . Hyperlipidemia   . Hypertension     Past Surgical History:  Procedure Laterality Date  . COLONOSCOPY  2015  . large intestine surgery    . SMALL INTESTINE SURGERY  2012   polyp that could't be removed    There were no vitals filed for this visit.  Subjective Assessment - 04/01/18 1240    Subjective  Pt. reports 1/10 R hip discomfort at this time.  No recent falls reported at this time but pt. reports a loss of balance while walking outside because she was distracted.       Limitations  Walking;Lifting;Standing;House hold activities    How long can you sit comfortably?  Unlimited with back support, 20 min without     How long can you stand comfortably?  Pt reports no duration limitation    How long can you walk comfortably?  Pt reports no duration limitations    Currently in Pain?  Yes    Pain Score  1     Pain Location  Hip    Pain Orientation  Right    Pain Descriptors / Indicators  Aching            TREATMENT  [place] L5 3 min. F/b (warm-up/no charge).    Neuromuscular Re-education:  Walking marching/ lateral stepping/ tandem walking forward and backwards (light  //-bars touching)  Walking with head turns (horizontal and vertical) and object identification, SBA, x6 laps in hall. Patient requires VCs to avoid using peripheral vision compensations.   // bars:   Tandem walking, airex balance beam, faded UE support, SBA, VCs for posture, dual task engagement. x5 laps Obstacle negotiation, level surface, no UE support, SBA, x5 laps.  Maneuvering cones/ step ups (6") with no UE assist.   Outside walking on varying terrain working on consistent step pattern/ BOS/ proper head position.    Patient educated on progressing HEP and balance strategies to ensure safety and decrease risk of falls.    PT Long Term Goals - 03/19/18 1821      PT LONG TERM GOAL #1   Title  Pt will be independent with HEP in order to improve strength and balance in order to decrease fall risk and improve function at home and work.     Baseline  not initiated    Time  4    Period  Weeks    Status  New    Target Date  04/16/18      PT LONG TERM GOAL #2   Title  Pt will decrease  5TSTS to <15 seconds in order to demonstrate clinically significant improvement in LE strength and decreased risk for falls.    Baseline  10/22: 19.4 sec    Time  4    Period  Weeks    Status  New    Target Date  04/16/18      PT LONG TERM GOAL #3   Title  Patient will report low back pain on NPRS to be at 0/10 to demonstrate return to PLOF.    Baseline  4/10    Time  4    Period  Weeks    Status  New    Target Date  04/16/18      PT LONG TERM GOAL #4   Title  Pt will improve BERG to >50 points in order to demonstrate clinically significant improvement in balance.    Baseline  43/56    Time  4    Period  Weeks    Target Date  04/16/18      PT LONG TERM GOAL #5   Title  Pt will improve DGI by at least 3 points in order to demonstrate clinically significant improvement in balance and decreased risk for falls.    Baseline  17/24    Time  4    Period  Weeks    Status  New    Target Date   04/16/18            Plan - 04/01/18 1252    Clinical Impression Statement  Pt. is progressing well with dynamic balance exercises and is making progress with head movements when ambulating down hallway.  Pt. reports slight dizziness with head turns after walking ~68 ft. continuously, which has decreased since initial visit.  Pt. will continue to strengthen hip musculature and continue with dynamic balance tasks in order to return to prior level of function.    Clinical Presentation  Stable    Clinical Decision Making  Low    Rehab Potential  Good    PT Frequency  1x / week    PT Duration  4 weeks    PT Treatment/Interventions  [place]al[place] Repostioning;Electrical Stimulation;Moist Heat;Ultrasound;Gait training;Stair training;Functional mobility training;Therapeutic activities;Therapeutic exercise;Balance training;Neuromuscular re-education;Patient/family education;Manual techniques;Passive range of motion;Energy conservation;Taping;Vestibular;Visual/perceptual remediation/compensation;Joint Manipulations;Spinal Manipulations    PT Next Visit Plan  Dynamic balance with dual task, teach balance strategies    PT Home Exercise Plan  [place] Exercises (at counter)    Consulted and Agree with Plan of Care  Patient       Patient will benefit from skilled therapeutic intervention in order to improve the following deficits and impairments:  Abnormal gait, Decreased strength, Pain, Improper body mechanics, Decreased balance, Decreased mobility, Decreased safety awareness, Difficulty walking, Impaired vision/preception  Visit Diagnosis: Other abnormalities of gait and mobility  Acute bilateral low back pain without sciatica  History of falling     Problem List Patient Active Problem List   Diagnosis Date Noted  . Osteoarthritis 09/17/2017  . 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 Bilateral lower extremity edema 01/14/2015  . HTN (hypertension) 01/13/2015  . Diabetes mellitus type 2, controlled,  without complications ([place]) 09/32/6712  . Hyperlipidemia 01/13/2015  . Overweight (BMI 25.0-29.9) 01/13/2015  . Allergic rhinitis 01/13/2015  . Hx of colonic polyps 01/13/2015  . 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 Vitamin D deficiency 01/13/2015  . FH: heart disease 01/13/2015  . FH: stroke 01/13/2015   Pura Spice, PT, DPT #  [place], SPT 04/01/2018, 3:15 PM  [facility] [address] [place], [place], [address] Phone: [phone]   Fax:  [phone]  Name: Delonna Ney MRN: [id number] Date of Birth: 20-Jan-1941

## 2018-04-02 ENCOUNTER — Ambulatory Visit (INDEPENDENT_AMBULATORY_CARE_PROVIDER_SITE_OTHER): Payer: Medicare Other | Admitting: Nurse Practitioner

## 2018-04-02 ENCOUNTER — Encounter (INDEPENDENT_AMBULATORY_CARE_PROVIDER_SITE_OTHER): Payer: Self-pay | Admitting: Nurse Practitioner

## 2018-04-02 VITALS — BP 148/85 | HR 47 | Resp 16 | Ht 65.0 in | Wt 176.4 lb

## 2018-04-02 DIAGNOSIS — E119 Type 2 diabetes mellitus without complications: Secondary | ICD-10-CM | POA: Diagnosis not present

## 2018-04-02 DIAGNOSIS — R600 Localized edema: Secondary | ICD-10-CM

## 2018-04-02 DIAGNOSIS — Z87891 Personal history of nicotine dependence: Secondary | ICD-10-CM | POA: Diagnosis not present

## 2018-04-02 DIAGNOSIS — I10 Essential (primary) hypertension: Secondary | ICD-10-CM | POA: Diagnosis not present

## 2018-04-02 NOTE — Progress Notes (Signed)
Subjective:    Patient ID: Angela Pope, female    DOB: September 04, 1940, 77 y.o.   MRN: [id number] Chief Complaint  Patient presents with  . New Patient (Initial Visit)    ref Ronnald Ramp for bil leg edema    HPI  Angela Pope is a 77 y.o. female that is referred by Dr. Ronnald Ramp with concerns for bilateral lower extremity swelling. The patient first noticed the swelling remotely but is now concerned because of a significant increase in the overall edema. The swelling is associated with pain and discoloration. The patient notes that in the morning the legs are significantly improved but they steadily worsened throughout the course of the day. Elevation makes the legs better, dependency makes them much worse.   There is no history of ulcerations associated with the swelling.   The patient denies any recent changes in their medications.  The patient has not been wearing graduated compression.  The patient has no had any past angiography, interventions or vascular surgery.  The patient denies a history of DVT or PE. There is no prior history of phlebitis. There is no history of primary lymphedema.  There is no history of radiation treatment to the groin or pelvis No history of malignancies. There is a history of abdominal surgery to remove a polyp that was too large to be removed by colonoscopy. No history of foreign travel or parasitic infections area    Past Medical History:  Diagnosis Date  . Diabetes mellitus without complication ([place])    controls with diet  . Hyperlipidemia   . Hypertension     Past Surgical History:  Procedure Laterality Date  . COLONOSCOPY  2015  . large intestine surgery    . SMALL INTESTINE SURGERY  2012   polyp that could't be removed    Social History   Socioeconomic History  . Marital status: Divorced    Spouse name: Not on file  . Number of children: 2  . Years of education: [employer]  . Highest education level: 12th grade  Occupational  History  . Occupation: Retired  Scientific laboratory technician  . Financial resource strain: Not hard at all  . Food insecurity:    Worry: Never true    Inability: Never true  . Transportation needs:    Medical: No    Non-medical: No  Tobacco Use  . Smoking status: Former Smoker    Packs/day: 0.25    Years: 10.00    Pack years: 2.50    Types: Cigarettes    Last attempt to quit: 10/18/1965    Years since quitting: 52.4  . Smokeless tobacco: Never Used  . Tobacco comment: smoking cessation materials not required  Substance and Sexual Activity  . Alcohol use: Not Currently    Alcohol/week: 0.0 standard drinks  . Drug use: No  . Sexual activity: Never  Lifestyle  . Physical activity:    Days per week: 0 days    Minutes per session: 0 min  . Stress: Not at all  Relationships  . Social connections:    Talks on phone: Patient refused    Gets together: Patient refused    Attends religious service: Patient refused    Active member of club or organization: Patient refused    Attends meetings of clubs or organizations: Patient refused    Relationship status: Divorced  . Intimate partner violence:    Fear of current or ex partner: No    Emotionally abused: No    Physically abused: No  Forced sexual activity: No  Other Topics Concern  . Not on file  Social History Narrative  . Not on file    Family History  Problem Relation Age of Onset  . Heart disease Mother   . Hypertension Mother   . Atrial fibrillation Mother   . Diabetes Brother   . Heart disease Brother   . Hypertension Brother   . Diabetes Paternal Grandfather   . Stroke Father   . Prostate cancer Brother   . Hypertension Brother   . Healthy Daughter   . Diabetes Son     Allergies  Allergen Reactions  . Clarithromycin   . Sulfa Antibiotics   . Tetracyclines & Related      Review of Systems   Review of Systems: Negative Unless Checked Constitutional: [] Weight loss  [] Fever  [] Chills Cardiac: [] Chest pain   []   Atrial Fibrillation  [] Palpitations   [] Shortness of breath when laying flat   [] Shortness of breath with exertion. Vascular:  [] Pain in legs with walking   [] Pain in legs with standing  [] History of DVT   [] Phlebitis   [x] Swelling in legs   [] Varicose veins   [] Non-healing ulcers Pulmonary:   [] Uses home oxygen   [] Productive cough   [] Hemoptysis   [] Wheeze  [] COPD   [] Asthma Neurologic:  [] Dizziness   [] Seizures   [x] History of stroke   [] History of TIA  [] Aphasia   [] Vissual changes   [] Weakness or numbness in arm   [] Weakness or numbness in leg Musculoskeletal:   [] Joint swelling   [x] Joint pain   [] Low back pain  []  History of Knee Replacement Hematologic:  [] Easy bruising  [] Easy bleeding   [] Hypercoagulable state   [] Anemic Gastrointestinal:  [] Diarrhea   [] Vomiting  [] Gastroesophageal reflux/heartburn   [] Difficulty swallowing. Genitourinary:  [] Chronic kidney disease   [] Difficult urination  [] Anuric   [] Blood in urine Skin:  [] Rashes   [] Ulcers  Psychological:  [] History of anxiety   []  History of major depression  []  Memory Difficulties     Objective:   Physical Exam  BP (!) 148/85 (BP Location: Right Arm)   Pulse (!) 47   Resp 16   Ht 5\' 5"  (1.651 m)   Wt 176 lb 6.4 oz (80 kg)   BMI 29.35 kg/m   Gen: WD/WN, NAD Head: [place]/AT, No temporalis wasting.  Ear/Nose/Throat: Hearing grossly intact, nares w/o erythema or drainage Eyes: PER, EOMI, sclera nonicteric.  Neck: Supple, no masses.  No JVD.  Pulmonary:  Good air movement, no use of accessory muscles.  Cardiac: RRR Vascular: 2+ soft edema of right, 3+ edema of left  Vessel Right Left  Radial Palpable Palpable  Dorsalis Pedis Palpable Palpable  Posterior Tibial Palpable Palpable   Gastrointestinal: soft, non-distended. No guarding/no peritoneal signs.  Musculoskeletal: M/S 5/5 throughout.  No deformity or atrophy.  Neurologic: Pain and light touch intact in extremities.  Symmetrical.  Speech is fluent. Motor exam as  listed above. Psychiatric: Judgment intact, Mood & affect appropriate for pt's clinical situation. Dermatologic: No Venous rashes. No Ulcers Noted.  No changes consistent with cellulitis. Lymph : No Cervical lymphadenopathy, no lichenification or skin changes of chronic lymphedema.      Assessment & Plan:   1. Lower extremity edema I have had a long discussion with the patient regarding swelling and why it  causes symptoms.  Patient will begin wearing graduated compression stockings class 1 (20-30 mmHg) on a daily basis a prescription was given. The patient will  beginning  wearing the stockings first thing in the morning and removing them in the evening. The patient is instructed specifically not to sleep in the stockings.   In addition, behavioral modification will be initiated.  This will include frequent elevation, use of over the counter pain medications and exercise such as walking.  I have reviewed systemic causes for chronic edema such as liver, kidney and cardiac etiologies.  The patient denies problems with these organ systems.    Consideration for a lymph pump will also be made based upon the effectiveness of conservative therapy.  This would help to improve the edema control and prevent sequela such as ulcers and infections   Patient should undergo duplex ultrasound of the venous system to ensure that DVT or reflux is not present.  The patient will follow-up with me after the ultrasound.   - VAS [place] LOWER EXTREMITY VENOUS REFLUX; Future  2. Essential hypertension Continue antihypertensive medications as already ordered, these medications have been reviewed and there are no changes at this time.   3. Controlled type 2 diabetes mellitus without complication, without long-term current use of insulin ([place]) Continue hypoglycemic medications as already ordered, these medications have been reviewed and there are no changes at this time.  Hgb A1C to be monitored as already arranged  by primary service    Current Outpatient Medications on File Prior to Visit  Medication Sig Dispense Refill  . glucosamine-chondroitin (MAX GLUCOSAMINE CHONDROITIN) 500-400 MG tablet Take 1 tablet by mouth 2 (two) times daily. 30 tablet 0  . glucose blood test strip Use as instructed 100 each 0  . hydrochlorothiazide (HYDRODIURIL) 25 MG tablet TAKE 2 TABLETS(50 MG) BY MOUTH DAILY 180 tablet 0  . lisinopril (PRINIVIL,ZESTRIL) 40 MG tablet Take 1 tablet (40 mg total) by mouth daily. 90 tablet 3  . loperamide (IMODIUM) 2 MG capsule Take 2 mg by mouth as needed for diarrhea or loose stools.    . Multiple Vitamin (MULTIVITAMIN) capsule Take 1 capsule by mouth daily. 30 capsule 0  . naproxen (NAPROSYN) 250 MG tablet Take 1 tablet (250 mg total) by mouth 2 (two) times daily with a meal. 60 tablet 0  . olopatadine (PATANOL) 0.1 % ophthalmic solution Place 1 drop into both eyes 2 (two) times daily as needed for allergies. 5 mL 12  . simvastatin (ZOCOR) 40 MG tablet Take 1 tablet (40 mg total) by mouth daily. 90 tablet 3  . triamcinolone (NASACORT AQ) 55 MCG/ACT AERO nasal inhaler Place 2 sprays into the nose daily. 1 Inhaler 3   No current facility-administered medications on file prior to visit.     There are no Patient Instructions on file for this visit. No follow-ups on file.   Kris Hartmann, NP  This note was completed with Sales executive.  Any errors are purely unintentional.

## 2018-04-08 ENCOUNTER — Ambulatory Visit: Payer: Medicare Other | Admitting: Physical Therapy

## 2018-04-08 ENCOUNTER — Encounter: Payer: Self-pay | Admitting: Physical Therapy

## 2018-04-08 DIAGNOSIS — R2689 Other abnormalities of gait and mobility: Secondary | ICD-10-CM | POA: Diagnosis not present

## 2018-04-08 DIAGNOSIS — M545 Low back pain, unspecified: Secondary | ICD-10-CM

## 2018-04-08 DIAGNOSIS — Z9181 History of falling: Secondary | ICD-10-CM

## 2018-04-08 NOTE — Therapy (Signed)
[facility] [address]. [place], [place], [address] Phone: [phone]   Fax:  [phone]  Physical Therapy Treatment  Patient Details  Name: Angela Pope MRN: [id number] Date of Birth: Oct 17, 1940 Referring Provider (PT): Otilio Miu   Encounter Date: 04/08/2018  PT End of Session - 04/08/18 1025    Visit Number  4    Number of Visits  5    Date for PT Re-Evaluation  04/16/18    PT Start Time  1025    PT Stop Time  1115    PT Time Calculation (min)  50 min    Equipment Utilized During Treatment  Gait belt    Activity Tolerance  Patient tolerated treatment well    Behavior During Therapy  [facility] for tasks assessed/performed       Past Medical History:  Diagnosis Date  . Diabetes mellitus without complication ([place])    controls with diet  . Hyperlipidemia   . Hypertension     Past Surgical History:  Procedure Laterality Date  . COLONOSCOPY  2015  . large intestine surgery    . SMALL INTESTINE SURGERY  2012   polyp that could't be removed    There were no vitals filed for this visit.  Subjective Assessment - 04/08/18 1025    Subjective  Pt. reports no pain in R hip.  Pt. states she has not fallen since last visit and has consistently been performing exercises without much difficulty.     Limitations  Walking;Lifting;Standing;House hold activities    How long can you sit comfortably?  Unlimited with back support, 20 min without     How long can you stand comfortably?  Pt reports no duration limitation    How long can you walk comfortably?  Pt reports no duration limitations    Currently in Pain?  No/denies    Pain Score  0-No pain         TREATMENT   Neuromuscular Re-education:   Walking with head turns (horizontal and vertical), SBA, x6 laps in hall. Patient requires VCs to avoid using peripheral vision compensations   // bars:  Static Stance on Airex 4x30 sec Tandem Stance on Airex 4x30 sec SLS  Stance on Airex, attempt to do 30 sec, however was only unable to perform max 8 sec x multiple bouts. Static Stance with eyes closed on Airex 4x30 sec Tandem Stance with eyes closed on Airex 4x30 sec  Goal Reassessment was performed and noted below.    PT Long Term Goals - 04/08/18 1030      PT LONG TERM GOAL #1   Title  Pt will be independent with HEP in order to improve strength and balance in order to decrease fall risk and improve function at home and work.     Baseline  not initiated    Time  4    Period  Weeks    Status  Achieved    Target Date  04/08/18      PT LONG TERM GOAL #2   Title  Pt will decrease 5TSTS to <15 seconds in order to demonstrate clinically significant improvement in LE strength and decreased risk for falls.    Baseline  10/22: 19.4 sec; 11/11: 16.49 sec    Time  4    Period  Weeks    Status  Partially Met    Target Date  04/08/18      PT LONG TERM GOAL #3   Title  Patient  will report low back pain on NPRS to be at 0/10 to demonstrate return to PLOF.    Baseline  0/10 Back pain    Time  4    Period  Weeks    Status  Achieved    Target Date  04/08/18      PT LONG TERM GOAL #4   Title  Pt will improve BERG to >50 points in order to demonstrate clinically significant improvement in balance.    Baseline  43/56; 11/11 BERG: 53/56    Time  4    Period  Weeks    Status  Achieved    Target Date  04/08/18      PT LONG TERM GOAL #5   Title  Pt will improve DGI by at least 3 points in order to demonstrate clinically significant improvement in balance and decreased risk for falls.    Baseline  19/24; DGI: 20/24    Time  4    Period  Weeks    Status  Partially Met    Target Date  04/08/18            Plan - 04/08/18 1644    Clinical Impression Statement  Pt. has made significant progress towards goals as noted below in the goal reassessment.  Pt. self-reports feeling much more stable and feeling that her HEP is adequate for home program to  continue after therapy.  Pt. notes she feels more stable during ambulation and will continue with the strengthening exercises at home.  Pt. encouraged to reach out to clinic if any regression occurs or questions arise.  Pt. will be discharged at this time.    Clinical Presentation  Stable    Clinical Decision Making  Low    Rehab Potential  Good    PT Frequency  1x / week    PT Duration  4 weeks    PT Treatment/Interventions  [place] Repostioning;Electrical Stimulation;Moist Heat;Ultrasound;Gait training;Stair training;Functional mobility training;Therapeutic activities;Therapeutic exercise;Balance training;Neuromuscular re-education;Patient/family education;Manual techniques;Passive range of motion;Energy conservation;Taping;Vestibular;Visual/perceptual remediation/compensation;Joint Manipulations;Spinal Manipulations    PT Next Visit Plan  d/c at this time.    PT Home Exercise Plan  [place] Exercises (at counter)    Consulted and Agree with Plan of Care  Patient       Patient will benefit from skilled therapeutic intervention in order to improve the following deficits and impairments:  Abnormal gait, Decreased strength, Pain, Improper body mechanics, Decreased balance, Decreased mobility, Decreased safety awareness, Difficulty walking, Impaired vision/preception  Visit Diagnosis: Other abnormalities of gait and mobility  Acute bilateral low back pain without sciatica  History of falling     Problem List Patient Active Problem List   Diagnosis Date Noted  . Osteoarthritis 09/17/2017  . 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 Bilateral lower extremity edema 01/14/2015  . HTN (hypertension) 01/13/2015  . Diabetes mellitus type 2, controlled, without complications ([place]) 16/60/6301  . Hyperlipidemia 01/13/2015  . Overweight (BMI 25.0-29.9) 01/13/2015  . Allergic rhinitis 01/13/2015  . Hx of colonic polyps 01/13/2015  . 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 Chronic right hip pain  01/13/2015  . Vitamin D deficiency 01/13/2015  . FH: heart disease 01/13/2015  . FH: stroke 01/13/2015   Pura Spice, PT, DPT # [id number] Gwenlyn Saran, SPT 04/09/2018, 7:21 AM  [facility] [address] [place], [place], [address] Phone: [phone]   Fax:  [phone]  Name: Angela Pope MRN: [id number] Date of Birth: 07-Oct-1940

## 2018-04-09 ENCOUNTER — Ambulatory Visit
Admission: RE | Admit: 2018-04-09 | Discharge: 2018-04-09 | Disposition: A | Discharge status: Home / Self Care | Payer: Medicare Other | Source: Ambulatory Visit | Attending: Family Medicine | Admitting: Family Medicine

## 2018-04-09 DIAGNOSIS — Z1231 Encounter for screening mammogram for malignant neoplasm of breast: Secondary | ICD-10-CM | POA: Diagnosis not present

## 2018-04-15 ENCOUNTER — Encounter: Payer: Medicare Other | Admitting: Physical Therapy

## 2018-05-03 ENCOUNTER — Ambulatory Visit (INDEPENDENT_AMBULATORY_CARE_PROVIDER_SITE_OTHER): Payer: Medicare Other | Admitting: Vascular Surgery

## 2018-05-03 ENCOUNTER — Encounter (INDEPENDENT_AMBULATORY_CARE_PROVIDER_SITE_OTHER): Payer: Medicare Other

## 2018-05-10 DIAGNOSIS — E119 Type 2 diabetes mellitus without complications: Secondary | ICD-10-CM | POA: Diagnosis not present

## 2018-05-10 LAB — HM DIABETES EYE EXAM

## 2018-05-14 ENCOUNTER — Other Ambulatory Visit: Payer: Self-pay

## 2018-07-10 ENCOUNTER — Ambulatory Visit (INDEPENDENT_AMBULATORY_CARE_PROVIDER_SITE_OTHER): Payer: Medicare Other | Admitting: Family Medicine

## 2018-07-10 ENCOUNTER — Encounter: Payer: Self-pay | Admitting: Family Medicine

## 2018-07-10 VITALS — BP 134/82 | HR 80 | Ht 65.0 in | Wt 176.0 lb

## 2018-07-10 DIAGNOSIS — I10 Essential (primary) hypertension: Secondary | ICD-10-CM

## 2018-07-10 MED ORDER — HYDROCHLOROTHIAZIDE 25 MG PO TABS: 25.0000 mg | ORAL_TABLET | Freq: Every day | ORAL | 1 refills | Status: DC

## 2018-07-10 NOTE — Progress Notes (Signed)
Date:  07/10/2018   Name:  Angela Pope   DOB:  1940/08/27   MRN:  [id number]   Chief Complaint: Hypertension (pt was told that she shouldn't be ob HCTZ. I see where it was filled in August of last year. She has been checking her B/P with a wrist cuff and getting high numbers)  Hypertension  This is a chronic problem. The current episode started more than 1 year ago. The problem has been waxing and waning since onset. The problem is uncontrolled. Associated symptoms include peripheral edema. Pertinent negatives include no anxiety, blurred vision, chest pain, headaches, malaise/fatigue, neck pain, orthopnea, palpitations, PND, shortness of breath or sweats. There are no associated agents to hypertension. Past treatments include ACE inhibitors. The current treatment provides mild improvement. There are no compliance problems.  There is no history of angina, kidney disease, CAD/MI, CVA, heart failure, left ventricular hypertrophy, PVD or retinopathy. There is no history of chronic renal disease, a hypertension causing med or renovascular disease.    Review of Systems  Constitutional: Negative.  Negative for chills, fatigue, fever, malaise/fatigue and unexpected weight change.  HENT: Negative for congestion, ear discharge, ear pain, rhinorrhea, sinus pressure, sneezing and sore throat.   Eyes: Negative for blurred vision, photophobia, pain, discharge, redness and itching.  Respiratory: Negative for cough, shortness of breath, wheezing and stridor.   Cardiovascular: Negative for chest pain, palpitations, orthopnea and PND.  Gastrointestinal: Negative for abdominal pain, blood in stool, constipation, diarrhea, nausea and vomiting.  Endocrine: Negative for cold intolerance, heat intolerance, polydipsia, polyphagia and polyuria.  Genitourinary: Negative for dysuria, flank pain, frequency, hematuria, menstrual problem, pelvic pain, urgency, vaginal bleeding and vaginal discharge.  Musculoskeletal:  Negative for arthralgias, back pain, myalgias and neck pain.  Skin: Negative for rash.  Allergic/Immunologic: Negative for environmental allergies and food allergies.  Neurological: Negative for dizziness, weakness, light-headedness, numbness and headaches.  Hematological: Negative for adenopathy. Does not bruise/bleed easily.  Psychiatric/Behavioral: Negative for dysphoric mood. The patient is not nervous/anxious.     Patient Active Problem List   Diagnosis Date Noted  . Osteoarthritis 09/17/2017  . 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 Bilateral lower extremity edema 01/14/2015  . HTN (hypertension) 01/13/2015  . Diabetes mellitus type 2, controlled, without complications ([place]) 40/98/1191  . Hyperlipidemia 01/13/2015  . Overweight (BMI 25.0-29.9) 01/13/2015  . Allergic rhinitis 01/13/2015  . Hx of colonic polyps 01/13/2015  . 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 Vitamin D deficiency 01/13/2015  . FH: heart disease 01/13/2015  . FH: stroke 01/13/2015    Allergies  Allergen Reactions  . Clarithromycin   . Sulfa Antibiotics   . Tetracyclines & Related     Past Surgical History:  Procedure Laterality Date  . COLONOSCOPY  2015  . large intestine surgery    . SMALL INTESTINE SURGERY  2012   polyp that could't be removed    Social History   Tobacco Use  . Smoking status: Former Smoker    Packs/day: 0.25    Years: 10.00    Pack years: 2.50    Types: Cigarettes    Last attempt to quit: 10/18/1965    Years since quitting: 52.7  . Smokeless tobacco: Never Used  . Tobacco comment: smoking cessation materials not required  Substance Use Topics  . Alcohol use: Not Currently    Alcohol/week: 0.0 standard drinks  . Drug use: No     Medication list has been reviewed  and updated.  Current Meds  Medication Sig  . glucosamine-chondroitin (MAX GLUCOSAMINE CHONDROITIN) 500-400 MG tablet Take 1 tablet by mouth 2 (two) times daily.  Marland Kitchen  glucose blood test strip Use as instructed  . lisinopril (PRINIVIL,ZESTRIL) 40 MG tablet Take 1 tablet (40 mg total) by mouth daily.  Marland Kitchen loperamide (IMODIUM) 2 MG capsule Take 2 mg by mouth as needed for diarrhea or loose stools.  . Multiple Vitamin (MULTIVITAMIN) capsule Take 1 capsule by mouth daily.  . simvastatin (ZOCOR) 40 MG tablet Take 1 tablet (40 mg total) by mouth daily.  . [DISCONTINUED] hydrochlorothiazide (HYDRODIURIL) 25 MG tablet TAKE 2 TABLETS(50 MG) BY MOUTH DAILY    PHQ 2/9 Scores 10/31/2017 03/15/2017 10/30/2016 10/19/2015  PHQ - 2 Score 0 0 0 0  PHQ- 9 Score 0 - - -    Physical Exam Vitals signs and nursing note reviewed.  Constitutional:      General: She is not in acute distress.    Appearance: She is normal weight. She is not diaphoretic.  HENT:     Head: Normocephalic and atraumatic.     Right Ear: Tympanic membrane, ear canal and external ear normal.     Left Ear: Tympanic membrane, ear canal and external ear normal.     Nose: Nose normal.  Eyes:     General:        Right eye: No discharge.        Left eye: No discharge.     Conjunctiva/sclera: Conjunctivae normal.     Pupils: Pupils are equal, round, and reactive to light.  Neck:     Musculoskeletal: Normal range of motion and neck supple.     Thyroid: No thyromegaly.     Vascular: No JVD.  Cardiovascular:     Rate and Rhythm: Normal rate and regular rhythm.     Heart sounds: Normal heart sounds. No murmur. No friction rub. No gallop.   Pulmonary:     Effort: Pulmonary effort is normal. No respiratory distress.     Breath sounds: Normal breath sounds. No stridor. No wheezing, rhonchi or rales.  Chest:     Chest wall: No tenderness.  Abdominal:     General: Bowel sounds are normal. There is no distension.     Palpations: Abdomen is soft. There is no mass.     Tenderness: There is no abdominal tenderness. There is no guarding or rebound.     Hernia: No hernia is present.  Musculoskeletal: Normal range  of motion.  Lymphadenopathy:     Cervical: No cervical adenopathy.  Skin:    General: Skin is warm and dry.  Neurological:     Mental Status: She is alert.     Deep Tendon Reflexes: Reflexes are normal and symmetric.     BP 134/82   Pulse 80   Ht 5\' 5"  (1.651 m)   Wt 176 lb (79.8 kg)   BMI 29.29 kg/m   Assessment and Plan:  1. Essential hypertension .  Controlled.  And is currently on lisinopril 10 mg but has not been taking hydrochlorothiazide for the last several months.  Blood pressure dated last.  Resume hydrochlorothiazide 25 mg once a day and will recheck.  Have discussed the use of wrist monitoring which is not as accurate and patient will be getting a new unit and will come with unit the next time recheck of blood pressure to compare readings. - hydrochlorothiazide (HYDRODIURIL) 25 MG tablet; Take 1 tablet (25 mg total) by mouth  daily.  Dispense: 90 tablet; Refill: 1

## 2018-07-29 ENCOUNTER — Other Ambulatory Visit: Payer: Self-pay

## 2018-07-29 ENCOUNTER — Ambulatory Visit (INDEPENDENT_AMBULATORY_CARE_PROVIDER_SITE_OTHER): Payer: Medicare Other | Admitting: Family Medicine

## 2018-07-29 ENCOUNTER — Encounter: Payer: Self-pay | Admitting: Family Medicine

## 2018-07-29 VITALS — BP 127/84 | HR 72 | Resp 16 | Ht 65.0 in | Wt 175.0 lb

## 2018-07-29 DIAGNOSIS — Z23 Encounter for immunization: Secondary | ICD-10-CM | POA: Diagnosis not present

## 2018-07-29 DIAGNOSIS — E785 Hyperlipidemia, unspecified: Secondary | ICD-10-CM | POA: Diagnosis not present

## 2018-07-29 DIAGNOSIS — E782 Mixed hyperlipidemia: Secondary | ICD-10-CM | POA: Diagnosis not present

## 2018-07-29 DIAGNOSIS — I10 Essential (primary) hypertension: Secondary | ICD-10-CM | POA: Diagnosis not present

## 2018-07-29 DIAGNOSIS — E119 Type 2 diabetes mellitus without complications: Secondary | ICD-10-CM | POA: Diagnosis not present

## 2018-07-29 MED ORDER — LISINOPRIL 40 MG PO TABS: 40.0000 mg | ORAL_TABLET | Freq: Every day | ORAL | 1 refills | Status: DC

## 2018-07-29 MED ORDER — HYDROCHLOROTHIAZIDE 25 MG PO TABS: 25.0000 mg | ORAL_TABLET | Freq: Every day | ORAL | 1 refills | Status: DC

## 2018-07-29 MED ORDER — SIMVASTATIN 40 MG PO TABS: 40.0000 mg | ORAL_TABLET | Freq: Every day | ORAL | 1 refills | Status: DC

## 2018-07-29 NOTE — Progress Notes (Signed)
Date:  07/29/2018   Name:  Angela Pope   DOB:  12-27-1940   MRN:  [id number]   Chief Complaint: Hypertension and Hyperlipidemia  Hypertension  This is a chronic problem. The current episode started more than 1 year ago. The problem is unchanged. The problem is controlled. Pertinent negatives include no anxiety, blurred vision, chest pain, headaches, malaise/fatigue, neck pain, orthopnea, palpitations, peripheral edema, PND, shortness of breath or sweats. There are no associated agents to hypertension. Risk factors for coronary artery disease include diabetes mellitus, obesity and dyslipidemia. Past treatments include diuretics and ACE inhibitors. The current treatment provides moderate improvement. There are no compliance problems.  There is no history of angina, kidney disease, CAD/MI, CVA, heart failure, left ventricular hypertrophy, PVD or retinopathy. There is no history of chronic renal disease, a hypertension causing med or renovascular disease.  Hyperlipidemia  This is a chronic problem. The current episode started more than 1 year ago. The problem is controlled. Recent lipid tests were reviewed and are normal. She has no history of chronic renal disease, diabetes, hypothyroidism, liver disease, obesity or nephrotic syndrome. Pertinent negatives include no chest pain, focal sensory loss, focal weakness, leg pain, myalgias or shortness of breath. Current antihyperlipidemic treatment includes statins. The current treatment provides moderate improvement of lipids. There are no compliance problems.  Risk factors for coronary artery disease include diabetes mellitus, dyslipidemia and hypertension.  Diabetes  She presents for her follow-up diabetic visit. She has type 2 diabetes mellitus. Her disease course has been stable. Pertinent negatives for hypoglycemia include no confusion, dizziness, headaches, hunger, mood changes, nervousness/anxiousness, pallor, seizures, sleepiness, speech difficulty,  sweats or tremors. Pertinent negatives for diabetes include no blurred vision, no chest pain, no fatigue, no foot paresthesias, no foot ulcerations, no polydipsia, no polyphagia, no polyuria, no visual change, no weakness and no weight loss. There are no hypoglycemic complications. Symptoms are stable. There are no diabetic complications. Pertinent negatives for diabetic complications include no autonomic neuropathy, CVA, heart disease, impotence, nephropathy, peripheral neuropathy, PVD or retinopathy. There are no known risk factors for coronary artery disease. Current diabetic treatment includes diet. Her weight is stable. She is following a generally healthy diet. Meal planning includes avoidance of concentrated sweets and carbohydrate counting. There is no change in her home blood glucose trend. Her breakfast blood glucose is taken between 8-9 am. An ACE inhibitor/angiotensin II receptor blocker is being taken.    Review of Systems  Constitutional: Negative.  Negative for chills, fatigue, fever, malaise/fatigue, unexpected weight change and weight loss.  HENT: Negative for congestion, ear discharge, ear pain, rhinorrhea, sinus pressure, sneezing and sore throat.   Eyes: Negative for blurred vision, photophobia, pain, discharge, redness and itching.  Respiratory: Negative for cough, shortness of breath, wheezing and stridor.   Cardiovascular: Negative for chest pain, palpitations, orthopnea and PND.  Gastrointestinal: Negative for abdominal pain, blood in stool, constipation, diarrhea, nausea and vomiting.  Endocrine: Negative for cold intolerance, heat intolerance, polydipsia, polyphagia and polyuria.  Genitourinary: Negative for dysuria, flank pain, frequency, hematuria, impotence, menstrual problem, pelvic pain, urgency, vaginal bleeding and vaginal discharge.  Musculoskeletal: Negative for arthralgias, back pain, myalgias and neck pain.  Skin: Negative for pallor and rash.  Allergic/Immunologic:  Negative for environmental allergies and food allergies.  Neurological: Negative for dizziness, tremors, focal weakness, seizures, speech difficulty, weakness, light-headedness, numbness and headaches.  Hematological: Negative for adenopathy. Does not bruise/bleed easily.  Psychiatric/Behavioral: Negative for confusion and dysphoric mood. The patient is not nervous/anxious.  Patient Active Problem List   Diagnosis Date Noted  . Osteoarthritis 09/17/2017  . 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 Bilateral lower extremity edema 01/14/2015  . HTN (hypertension) 01/13/2015  . Diabetes mellitus type 2, controlled, without complications ([place]) 80/99/8338  . Hyperlipidemia 01/13/2015  . Overweight (BMI 25.0-29.9) 01/13/2015  . Allergic rhinitis 01/13/2015  . Hx of colonic polyps 01/13/2015  . 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 Vitamin D deficiency 01/13/2015  . FH: heart disease 01/13/2015  . FH: stroke 01/13/2015    Allergies  Allergen Reactions  . Clarithromycin   . Sulfa Antibiotics   . Tetracyclines & Related     Past Surgical History:  Procedure Laterality Date  . COLONOSCOPY  2015  . large intestine surgery    . SMALL INTESTINE SURGERY  2012   polyp that could't be removed    Social History   Tobacco Use  . Smoking status: Former Smoker    Packs/day: 0.25    Years: 10.00    Pack years: 2.50    Types: Cigarettes    Last attempt to quit: 10/18/1965    Years since quitting: 52.8  . Smokeless tobacco: Never Used  . Tobacco comment: smoking cessation materials not required  Substance Use Topics  . Alcohol use: Not Currently    Alcohol/week: 0.0 standard drinks  . Drug use: No     Medication list has been reviewed and updated.  Current Meds  Medication Sig  . glucosamine-chondroitin (MAX GLUCOSAMINE CHONDROITIN) 500-400 MG tablet Take 1 tablet by mouth 2 (two) times daily.  Marland Kitchen glucose blood test strip Use as instructed    . hydrochlorothiazide (HYDRODIURIL) 25 MG tablet Take 1 tablet (25 mg total) by mouth daily.  Marland Kitchen lisinopril (PRINIVIL,ZESTRIL) 40 MG tablet Take 1 tablet (40 mg total) by mouth daily.  Marland Kitchen loperamide (IMODIUM) 2 MG capsule Take 2 mg by mouth as needed for diarrhea or loose stools.  . Multiple Vitamin (MULTIVITAMIN) capsule Take 1 capsule by mouth daily.  . simvastatin (ZOCOR) 40 MG tablet Take 1 tablet (40 mg total) by mouth daily.  Marland Kitchen triamcinolone (NASACORT AQ) 55 MCG/ACT AERO nasal inhaler Place 2 sprays into the nose daily.  . [DISCONTINUED] naproxen (NAPROSYN) 250 MG tablet Take 1 tablet (250 mg total) by mouth 2 (two) times daily with a meal.    PHQ 2/9 Scores 07/29/2018 10/31/2017 03/15/2017 10/30/2016  PHQ - 2 Score 1 0 0 0  PHQ- 9 Score 6 0 - -    Physical Exam Vitals signs and nursing note reviewed.  Constitutional:      General: She is not in acute distress.    Appearance: She is not diaphoretic.  HENT:     Head: Normocephalic and atraumatic.     Right Ear: Hearing, tympanic membrane, ear canal and external ear normal. No decreased hearing noted.     Left Ear: Hearing, tympanic membrane, ear canal and external ear normal. No decreased hearing noted.     Nose: Nose normal.     Mouth/Throat:     Lips: Pink.     Mouth: Mucous membranes are moist.     Palate: No mass.     Pharynx: Oropharynx is clear. Uvula midline. No pharyngeal swelling.  Eyes:     General: Lids are normal.        Right eye: No discharge.        Left eye: No discharge.     Extraocular Movements:  Extraocular movements intact.     Right eye: Normal extraocular motion.     Left eye: Normal extraocular motion.     Conjunctiva/sclera: Conjunctivae normal.     Pupils: Pupils are equal, round, and reactive to light.  Neck:     Musculoskeletal: Normal range of motion and neck supple.     Thyroid: No thyroid mass, thyromegaly or thyroid tenderness.     Vascular: Normal carotid pulses. No carotid bruit, hepatojugular  reflux or JVD.  Cardiovascular:     Rate and Rhythm: Normal rate and regular rhythm.     Chest Wall: PMI is not displaced.     Pulses: Normal pulses. No decreased pulses.          Carotid pulses are 2+ on the right side and 2+ on the left side.      Radial pulses are 2+ on the right side and 2+ on the left side.       Femoral pulses are 2+ on the right side and 2+ on the left side.      Popliteal pulses are 2+ on the right side and 2+ on the left side.       Dorsalis pedis pulses are 2+ on the right side and 2+ on the left side.       Posterior tibial pulses are 2+ on the right side and 2+ on the left side.     Heart sounds: Normal heart sounds and S1 normal. No murmur. No systolic murmur. No diastolic murmur. No friction rub. No gallop. No S3 or S4 sounds.   Pulmonary:     Effort: Pulmonary effort is normal.     Breath sounds: Normal breath sounds. No decreased air movement. No decreased breath sounds, wheezing, rhonchi or rales.  Abdominal:     General: Bowel sounds are normal.     Palpations: Abdomen is soft. There is no mass.     Tenderness: There is no abdominal tenderness. There is no guarding.  Musculoskeletal: Normal range of motion.     Right lower leg: No edema.     Left lower leg: No edema.  Lymphadenopathy:     Head:     Right side of head: No submandibular adenopathy.     Left side of head: No submandibular adenopathy.     Cervical: No cervical adenopathy.  Skin:    General: Skin is warm and dry.     Capillary Refill: Capillary refill takes less than 2 seconds.  Neurological:     Mental Status: She is alert.     Deep Tendon Reflexes: Reflexes are normal and symmetric.     BP 127/84   Pulse 72   Resp 16   Ht 5\' 5"  (1.651 m)   Wt 175 lb (79.4 kg)   BMI 29.12 kg/m    Assessment and Plan: 1. Essential hypertension Chronic.  Controlled.  Continue lisinopril 40 mg daily and hydrochlorothiazide 25 mg daily.  Patient's labs were reviewed from previous visit and no  labs were taken today patient will be rechecked in months. - lisinopril (PRINIVIL,ZESTRIL) 40 MG tablet; Take 1 tablet (40 mg total) by mouth daily.  Dispense: 90 tablet; Refill: 1 - hydrochlorothiazide (HYDRODIURIL) 25 MG tablet; Take 1 tablet (25 mg total) by mouth daily.  Dispense: 90 tablet; Refill: 1  2. Hyperlipidemia Chronic.  Controlled.  Continue simvastatin 40 mg daily - simvastatin (ZOCOR) 40 MG tablet; Take 1 tablet (40 mg total) by mouth daily.  Dispense: 90 tablet; Refill: 1  3. Controlled type 2 diabetes mellitus without complication, without long-term current use of insulin (HCC) Chronic.  Diet controlled.  Patient's A1c will be drawn and if within reasonable range patient will be seen in 6 months. - Hemoglobin A1c  4. Need for shingles vaccine Discussed and referred to pharmacy. - Varicella-zoster vaccine IM (Shingrix)

## 2018-07-30 LAB — HEMOGLOBIN A1C
ESTIMATED AVERAGE GLUCOSE: 143 mg/dL
HEMOGLOBIN A1C: 6.6 % — AB (ref 4.8–5.6)

## 2018-07-30 NOTE — Addendum Note (Signed)
Addended by: Fredderick Severance on: 07/30/2018 11:25 AM   Modules accepted: Level of Service

## 2018-08-09 ENCOUNTER — Other Ambulatory Visit: Payer: Self-pay

## 2018-08-09 DIAGNOSIS — E119 Type 2 diabetes mellitus without complications: Secondary | ICD-10-CM

## 2018-08-09 MED ORDER — ONETOUCH ULTRASOFT LANCETS MISC: 12 refills | Status: DC

## 2018-08-09 MED ORDER — GLUCOSE BLOOD VI STRP: ORAL_STRIP | 12 refills | Status: DC

## 2018-08-13 ENCOUNTER — Other Ambulatory Visit: Payer: Self-pay

## 2018-08-13 DIAGNOSIS — I10 Essential (primary) hypertension: Secondary | ICD-10-CM

## 2018-08-13 MED ORDER — LOSARTAN POTASSIUM 100 MG PO TABS: 100.0000 mg | ORAL_TABLET | Freq: Every day | ORAL | 1 refills | Status: DC

## 2018-08-22 ENCOUNTER — Ambulatory Visit: Payer: Self-pay | Admitting: Family Medicine

## 2018-08-23 ENCOUNTER — Ambulatory Visit (INDEPENDENT_AMBULATORY_CARE_PROVIDER_SITE_OTHER): Payer: Medicare Other | Admitting: Family Medicine

## 2018-08-23 ENCOUNTER — Encounter: Payer: Self-pay | Admitting: Family Medicine

## 2018-08-23 ENCOUNTER — Other Ambulatory Visit: Payer: Self-pay

## 2018-08-23 VITALS — BP 136/80 | HR 72 | Temp 99.1°F | Ht 65.0 in | Wt 174.0 lb

## 2018-08-23 DIAGNOSIS — I10 Essential (primary) hypertension: Secondary | ICD-10-CM

## 2018-08-23 NOTE — Progress Notes (Signed)
Date:  08/23/2018   Name:  Angela Pope   DOB:  Dec 28, 1940   MRN:  [id number]   Chief Complaint: Hypertension (has been getting elevated readings at home)  Hypertension  This is a chronic problem. The current episode started more than 1 year ago. The problem is controlled. Associated symptoms include anxiety, headaches and neck pain. Pertinent negatives include no blurred vision, chest pain, malaise/fatigue, orthopnea, palpitations, peripheral edema, PND, shortness of breath or sweats. Past treatments include angiotensin blockers and diuretics. The current treatment provides moderate improvement. There are no compliance problems.  There is no history of angina, kidney disease, CAD/MI, CVA, heart failure, left ventricular hypertrophy, PVD or retinopathy. There is no history of chronic renal disease, a hypertension causing med or renovascular disease.    Review of Systems  Constitutional: Negative.  Negative for chills, fatigue, fever, malaise/fatigue and unexpected weight change.  HENT: Negative for congestion, ear discharge, ear pain, rhinorrhea, sinus pressure, sneezing and sore throat.   Eyes: Negative for blurred vision, photophobia, pain, discharge, redness and itching.  Respiratory: Positive for cough. Negative for shortness of breath, wheezing and stridor.        Allergy related  Cardiovascular: Negative for chest pain, palpitations, orthopnea and PND.  Gastrointestinal: Negative for abdominal pain, blood in stool, constipation, diarrhea, nausea and vomiting.  Endocrine: Negative for cold intolerance, heat intolerance, polydipsia, polyphagia and polyuria.  Genitourinary: Negative for dysuria, flank pain, frequency, hematuria, menstrual problem, pelvic pain, urgency, vaginal bleeding and vaginal discharge.  Musculoskeletal: Positive for neck pain. Negative for arthralgias, back pain and myalgias.  Skin: Negative for rash.  Allergic/Immunologic: Negative for environmental allergies  and food allergies.  Neurological: Positive for headaches. Negative for dizziness, weakness, light-headedness and numbness.  Hematological: Negative for adenopathy. Does not bruise/bleed easily.  Psychiatric/Behavioral: Negative for dysphoric mood. The patient is not nervous/anxious.     Patient Active Problem List   Diagnosis Date Noted  . Osteoarthritis 09/17/2017  . 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 Bilateral lower extremity edema 01/14/2015  . HTN (hypertension) 01/13/2015  . Diabetes mellitus type 2, controlled, without complications ([place]) 83/15/1761  . Hyperlipidemia 01/13/2015  . Overweight (BMI 25.0-29.9) 01/13/2015  . Allergic rhinitis 01/13/2015  . Hx of colonic polyps 01/13/2015  . 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 Vitamin D deficiency 01/13/2015  . FH: heart disease 01/13/2015  . FH: stroke 01/13/2015    Allergies  Allergen Reactions  . Clarithromycin   . Sulfa Antibiotics   . Tetracyclines & Related     Past Surgical History:  Procedure Laterality Date  . COLONOSCOPY  2015  . large intestine surgery    . SMALL INTESTINE SURGERY  2012   polyp that could't be removed    Social History   Tobacco Use  . Smoking status: Former Smoker    Packs/day: 0.25    Years: 10.00    Pack years: 2.50    Types: Cigarettes    Last attempt to quit: 10/18/1965    Years since quitting: 52.8  . Smokeless tobacco: Never Used  . Tobacco comment: smoking cessation materials not required  Substance Use Topics  . Alcohol use: Not Currently    Alcohol/week: 0.0 standard drinks  . Drug use: No     Medication list has been reviewed and updated.  Current Meds  Medication Sig  . glucosamine-chondroitin (MAX GLUCOSAMINE CHONDROITIN) 500-400 MG tablet Take 1 tablet by mouth 2 (two) times daily.  Marland Kitchen  glucose blood test strip Use as instructed  . glucose blood test strip Use to check Blood Sugar 3 times daily for ICD10: E11.9(One touch  ultra blue brand. )  . hydrochlorothiazide (HYDRODIURIL) 25 MG tablet Take 1 tablet (25 mg total) by mouth daily.  . Lancets (ONETOUCH ULTRASOFT) lancets Use to check Blood Sugar 3 times daily for ICD 10:  E11.9  (Touch Twist Lancets for Ultra Touch)  . loperamide (IMODIUM) 2 MG capsule Take 2 mg by mouth as needed for diarrhea or loose stools.  Marland Kitchen losartan (COZAAR) 100 MG tablet Take 1 tablet (100 mg total) by mouth daily.  . Multiple Vitamin (MULTIVITAMIN) capsule Take 1 capsule by mouth daily.  . simvastatin (ZOCOR) 40 MG tablet Take 1 tablet (40 mg total) by mouth daily.  Marland Kitchen triamcinolone (NASACORT AQ) 55 MCG/ACT AERO nasal inhaler Place 2 sprays into the nose daily.    PHQ 2/9 Scores 07/29/2018 10/31/2017 03/15/2017 10/30/2016  PHQ - 2 Score 1 0 0 0  PHQ- 9 Score 6 0 - -    BP Readings from Last 3 Encounters:  08/23/18 136/80  07/29/18 127/84  07/10/18 134/82    Physical Exam Vitals signs and nursing note reviewed.  Constitutional:      General: She is not in acute distress.    Appearance: She is not diaphoretic.  HENT:     Head: Normocephalic and atraumatic.     Right Ear: Tympanic membrane, ear canal and external ear normal. There is no impacted cerumen.     Left Ear: Tympanic membrane, ear canal and external ear normal. There is impacted cerumen.     Nose: Nose normal. No congestion or rhinorrhea.     Mouth/Throat:     Mouth: Mucous membranes are moist.     Pharynx: Oropharynx is clear. Uvula midline. No pharyngeal swelling, oropharyngeal exudate or posterior oropharyngeal erythema.  Eyes:     General: Lids are normal.        Right eye: No discharge.        Left eye: No discharge.     Extraocular Movements: Extraocular movements intact.     Right eye: Normal extraocular motion.     Left eye: Normal extraocular motion.     Conjunctiva/sclera: Conjunctivae normal.     Pupils: Pupils are equal, round, and reactive to light.  Neck:     Musculoskeletal: Full passive range of  motion without pain, normal range of motion and neck supple.     Thyroid: No thyroid mass, thyromegaly or thyroid tenderness.     Vascular: No JVD.  Cardiovascular:     Rate and Rhythm: Normal rate and regular rhythm.     Pulses: Normal pulses.     Heart sounds: Normal heart sounds. No murmur. No friction rub. No gallop.   Pulmonary:     Effort: Pulmonary effort is normal. No respiratory distress.     Breath sounds: Normal breath sounds. No stridor. No decreased breath sounds, wheezing, rhonchi or rales.  Chest:     Chest wall: No tenderness.  Abdominal:     General: Bowel sounds are normal.     Palpations: Abdomen is soft. There is no mass.     Tenderness: There is no abdominal tenderness. There is no guarding.  Musculoskeletal: Normal range of motion.  Lymphadenopathy:     Head:     Right side of head: No submandibular adenopathy.     Left side of head: No submandibular adenopathy.     Cervical: No  cervical adenopathy.     Right cervical: No superficial, deep or posterior cervical adenopathy.    Left cervical: No superficial, deep or posterior cervical adenopathy.  Skin:    General: Skin is warm and dry.  Neurological:     General: No focal deficit present.     Mental Status: She is alert and oriented to person, place, and time.     Cranial Nerves: Cranial nerves are intact. No cranial nerve deficit.     Sensory: Sensation is intact. No sensory deficit.     Motor: Motor function is intact. No weakness.     Gait: Gait normal.     Deep Tendon Reflexes: Reflexes are normal and symmetric. Reflexes normal.     Wt Readings from Last 3 Encounters:  08/23/18 174 lb (78.9 kg)  07/29/18 175 lb (79.4 kg)  07/10/18 176 lb (79.8 kg)    BP 136/80   Pulse 72   Temp 99.1 F (37.3 C) (Oral)   Ht 5\' 5"  (1.651 m)   Wt 174 lb (78.9 kg)   BMI 28.96 kg/m   Assessment and Plan: 1. Essential hypertension Chronic.  Controlled.  Patient's had elevated readings with a recently purchased  pressure cuff.  She currently is on losartan 100 mg and hydrochlorothiazide 25 mg.  Blood pressure today by auscultation is 136/80.  Patient is encouraged to continue her medications and to avoid sodium.  Incidentally patient was noted to have a low-grade fever of 99.1 which he has not had a persistent cough nor shortness of breath.  She does not appear toxic.  She has not had any travel circumstances nor has she exposed to's individual with individual with any current viral circumstances.  Patient is encouraged to continue fluids and to take Tylenol for myalgias.  Patient encouraged to seek further medical attention if she develops dyspnea, cyanosis, chest discomfort, or new onset confusion.

## 2018-08-26 ENCOUNTER — Telehealth: Payer: Self-pay

## 2018-08-26 NOTE — Telephone Encounter (Signed)
Patient called in stating that she is feeling much better, no fever and "other than seasonal allergies, I'm fine." Therefore, status is good.

## 2018-08-26 NOTE — Telephone Encounter (Signed)
Called pt to check on her- left a message asking how she was and to return our call to give a status on how she is.

## 2018-09-16 ENCOUNTER — Other Ambulatory Visit: Payer: Self-pay

## 2018-09-16 MED ORDER — LOSARTAN POTASSIUM 100 MG PO TABS: 100.0000 mg | ORAL_TABLET | Freq: Every day | ORAL | 0 refills | Status: DC

## 2018-09-26 ENCOUNTER — Ambulatory Visit: Payer: Medicare Other | Admitting: Family Medicine

## 2018-11-04 ENCOUNTER — Ambulatory Visit: Payer: Medicare Other

## 2018-12-16 ENCOUNTER — Other Ambulatory Visit: Payer: Self-pay

## 2018-12-16 MED ORDER — LOSARTAN POTASSIUM 100 MG PO TABS: 100.0000 mg | ORAL_TABLET | Freq: Every day | ORAL | 0 refills | Status: DC

## 2019-01-06 ENCOUNTER — Other Ambulatory Visit: Payer: Self-pay

## 2019-01-06 ENCOUNTER — Ambulatory Visit (INDEPENDENT_AMBULATORY_CARE_PROVIDER_SITE_OTHER): Payer: Medicare Other

## 2019-01-06 VITALS — BP 132/82 | HR 82 | Resp 16 | Ht 65.0 in | Wt 181.0 lb

## 2019-01-06 DIAGNOSIS — Z0000 Encounter for general adult medical examination without abnormal findings: Secondary | ICD-10-CM | POA: Diagnosis not present

## 2019-01-06 DIAGNOSIS — Z1231 Encounter for screening mammogram for malignant neoplasm of breast: Secondary | ICD-10-CM

## 2019-01-06 DIAGNOSIS — Z780 Asymptomatic menopausal state: Secondary | ICD-10-CM

## 2019-01-06 NOTE — Progress Notes (Signed)
Subjective:   Angela Pope is a 78 y.o. female who presents for Medicare Annual (Subsequent) preventive examination.  Review of Systems:   Cardiac Risk Factors include: advanced age (>79men, >56 women);diabetes mellitus;dyslipidemia;hypertension;obesity (BMI >30kg/m2)     Objective:     Vitals: BP 132/82 (BP Location: Right Arm, Patient Position: Sitting, Cuff Size: Normal)   Pulse 82   Resp 16   Ht 5\' 5"  (1.651 m)   Wt 181 lb (82.1 kg)   BMI 30.12 kg/m   Body mass index is 30.12 kg/m.  Advanced Directives 01/06/2019 10/31/2017 03/15/2017 10/30/2016 01/13/2015  Does Patient Have a Medical Advance Directive? Yes Yes No Yes Yes  Type of Paramedic of [place];Living will [place];Living will - [place];Living will Living will  Copy of [place] in Chart? No - copy requested No - copy requested - No - copy requested -    Tobacco Social History   Tobacco Use  Smoking Status Former Smoker  . Packs/day: 0.25  . Years: 10.00  . Pack years: 2.50  . Types: Cigarettes  . Quit date: 10/18/1965  . Years since quitting: 53.2  Smokeless Tobacco Never Used  Tobacco Comment   smoking cessation materials not required     Counseling given: Not Answered Comment: smoking cessation materials not required   Clinical Intake:  Pre-visit preparation completed: Yes  Pain : No/denies pain     BMI - recorded: 30.12 Nutritional Status: BMI > 30  Obese Nutritional Risks: None Diabetes: Yes CBG done?: No Did pt. bring in CBG monitor from home?: No   Nutrition Risk Assessment:  Has the patient had any N/V/D within the last 2 months?  Yes  Does the patient have any non-healing wounds?  No  Has the patient had any unintentional weight loss or weight gain?  No   Diabetes:  Is the patient diabetic?  Yes  If diabetic, was a CBG obtained today?  No  Did the patient bring in their glucometer from home?   No  How often do you monitor your CBG's? Pt does not routinely check blood sugar.   Financial Strains and Diabetes Management:  Are you having any financial strains with the device, your supplies or your medication? No .  Does the patient want to be seen by Chronic Care Management for management of their diabetes?  No  Would the patient like to be referred to a Nutritionist or for Diabetic Management?  No   Diabetic Exams:  Diabetic Eye Exam: Completed 05/10/18 negative retinopathy.   Diabetic Foot Exam: Completed 03/27/18.   How often do you need to have someone help you when you read instructions, pamphlets, or other written materials from your doctor or pharmacy?: 1 - Never  Interpreter Needed?: No  Information entered by :: Clemetine Marker LPN  Past Medical History:  Diagnosis Date  . Diabetes mellitus without complication ([place])    controls with diet  . Hyperlipidemia   . Hypertension    Past Surgical History:  Procedure Laterality Date  . COLONOSCOPY  2015  . large intestine surgery    . SMALL INTESTINE SURGERY  2012   polyp that could't be removed   Family History  Problem Relation Age of Onset  . Heart disease Mother   . Hypertension Mother   . Atrial fibrillation Mother   . Diabetes Brother   . Heart disease Brother   . Hypertension Brother   . Diabetes Paternal  Grandfather   . Stroke Father   . Prostate cancer Brother   . Hypertension Brother   . Healthy Daughter   . Diabetes Son   . Breast cancer Neg Hx    Social History   Socioeconomic History  . Marital status: Divorced    Spouse name: Not on file  . Number of children: 2  . Years of education: [employer]  . Highest education level: 12th grade  Occupational History  . Occupation: Retired  Scientific laboratory technician  . Financial resource strain: Not hard at all  . Food insecurity    Worry: Never true    Inability: Never true  . Transportation needs    Medical: No    Non-medical: No  Tobacco Use  .  Smoking status: Former Smoker    Packs/day: 0.25    Years: 10.00    Pack years: 2.50    Types: Cigarettes    Quit date: 10/18/1965    Years since quitting: 53.2  . Smokeless tobacco: Never Used  . Tobacco comment: smoking cessation materials not required  Substance and Sexual Activity  . Alcohol use: Not Currently    Alcohol/week: 0.0 standard drinks  . Drug use: No  . Sexual activity: Never  Lifestyle  . Physical activity    Days per week: 0 days    Minutes per session: 0 min  . Stress: To some extent  Relationships  . Social Herbalist on phone: Patient refused    Gets together: Patient refused    Attends religious service: Patient refused    Active member of club or organization: Patient refused    Attends meetings of clubs or organizations: Patient refused    Relationship status: Divorced  Other Topics Concern  . Not on file  Social History Narrative  . Not on file    Outpatient Encounter Medications as of 01/06/2019  Medication Sig  . glucose blood test strip Use to check Blood Sugar 3 times daily for ICD10: E11.9(One touch ultra blue brand. )  . hydrochlorothiazide (HYDRODIURIL) 25 MG tablet Take 1 tablet (25 mg total) by mouth daily.  . Lancets (ONETOUCH ULTRASOFT) lancets Use to check Blood Sugar 3 times daily for ICD 10:  E11.9  (Touch Twist Lancets for Ultra Touch)  . loperamide (IMODIUM) 2 MG capsule Take 2 mg by mouth as needed for diarrhea or loose stools.  Marland Kitchen losartan (COZAAR) 100 MG tablet Take 1 tablet (100 mg total) by mouth daily.  . Multiple Vitamin (MULTIVITAMIN) capsule Take 1 capsule by mouth daily.  Marland Kitchen glucosamine-chondroitin (MAX GLUCOSAMINE CHONDROITIN) 500-400 MG tablet Take 1 tablet by mouth 2 (two) times daily. (Patient not taking: Reported on 01/06/2019)  . simvastatin (ZOCOR) 40 MG tablet Take 1 tablet (40 mg total) by mouth daily. (Patient not taking: Reported on 01/06/2019)  . triamcinolone (NASACORT AQ) 55 MCG/ACT AERO nasal inhaler  Place 2 sprays into the nose daily. (Patient not taking: Reported on 01/06/2019)  . [DISCONTINUED] glucose blood test strip Use as instructed   No facility-administered encounter medications on file as of 01/06/2019.     Activities of Daily Living In your present state of health, do you have any difficulty performing the following activities: 01/06/2019  Hearing? N  Comment declines hearing aids  Vision? N  Difficulty concentrating or making decisions? N  Walking or climbing stairs? N  Dressing or bathing? N  Doing errands, shopping? N  Preparing Food and eating ? N  Using the Toilet? N  In the past six months, have you accidently leaked urine? Y  Comment occasional leakage  Do you have problems with loss of bowel control? N  Managing your Medications? N  Managing your Finances? N  Housekeeping or managing your Housekeeping? N  Some recent data might be hidden    Patient Care Team: Juline Patch, MD as PCP - General (Family Medicine)    Assessment:   This is a routine wellness examination for Katlyn.  Exercise Activities and Dietary recommendations Current Exercise Habits: The patient does not participate in regular exercise at present, Exercise limited by: None identified  Goals    . DIET - INCREASE WATER INTAKE     Recommend to drink at least 6-8 8oz glasses of water per day.    . Have 3 meals a day     Recommend eating 3 health meals a day.       Fall Risk Fall Risk  01/06/2019 07/29/2018 10/31/2017 03/15/2017 12/18/2016  Falls in the past year? 0 0 No No Yes  Comment - - - - Emmi Telephone Survey: data to providers prior to load  Number falls in past yr: 0 0 - - 1  Comment - - - - Emmi Telephone Survey Actual Response = 1  Injury with Fall? 0 0 - - No  Risk for fall due to : - - History of fall(s);Impaired vision - -  Risk for fall due to: Comment - - wears eyeglasses - -  Follow up Falls prevention discussed - - - -   FALL RISK PREVENTION PERTAINING TO THE HOME:   Any stairs in or around the home? No  If so, do they handrails? No   Home free of loose throw rugs in walkways, pet beds, electrical cords, etc? Yes  Adequate lighting in your home to reduce risk of falls? Yes   ASSISTIVE DEVICES UTILIZED TO PREVENT FALLS:  Life alert? No  Use of a cane, walker or w/c? No  Grab bars in the bathroom? Yes  Shower chair or bench in shower? Yes  Elevated toilet seat or a handicapped toilet? Yes   DME ORDERS:  DME order needed?  No   TIMED UP AND GO:  Was the test performed? Yes .  Length of time to ambulate 10 feet: 6 sec.   GAIT:  Appearance of gait: Gait stead-fast and without the use of an assistive device.   Education: Fall risk prevention has been discussed.  Intervention(s) required? No   Depression Screen PHQ 2/9 Scores 01/06/2019 07/29/2018 10/31/2017 03/15/2017  PHQ - 2 Score 2 1 0 0  PHQ- 9 Score 4 6 0 -     Cognitive Function     6CIT Screen 01/06/2019 10/31/2017 10/30/2016  What Year? 0 points 4 points 0 points  What month? 0 points 0 points 0 points  What time? 0 points 0 points 0 points  Count back from 20 0 points 0 points 0 points  Months in reverse 0 points 0 points 0 points  Repeat phrase 0 points 0 points 2 points  Total Score 0 4 2    Immunization History  Administered Date(s) Administered  . Influenza, High Dose Seasonal PF 03/18/2018  . Influenza,inj,Quad PF,6+ Mos 03/10/2015, 03/21/2016, 03/15/2017  . Pneumococcal Conjugate-13 05/29/2009  . Pneumococcal Polysaccharide-23 05/29/2010  . Td 05/29/2010    Qualifies for Shingles Vaccine? Yes  Zostavax completed per patient. Due for Shingrix. Education has been provided regarding the importance of this vaccine. Pt has been  advised to call insurance company to determine out of pocket expense. Advised may also receive vaccine at local pharmacy or Health Dept. Verbalized acceptance and understanding.  Tdap: Up to date  Flu Vaccine: Up to date  Pneumococcal Vaccine:  Up to date   Screening Tests Health Maintenance  Topic Date Due  . INFLUENZA VACCINE  12/28/2018  . HEMOGLOBIN A1C  01/29/2019  . FOOT EXAM  03/28/2019  . MAMMOGRAM  04/10/2019  . OPHTHALMOLOGY EXAM  05/11/2019  . TETANUS/TDAP  05/29/2020  . DEXA SCAN  Completed  . PNA vac Low Risk Adult  Completed    Cancer Screenings:  Colorectal Screening: Completed 07/31/13. Pt was referred to GI previously in 2016 and stated patient not a candidate for procedure. Pt unsatisfied with this provider and will discuss new referral with PCP. .  Mammmogram: Completed 04/09/18. Repeat every year. Ordered today. Pt provided with contact information and advised to call to schedule appt.   Bone Density: Completed 2010. Results reflect unavailable. Repeat every 2 years. Ordered today. Pt provided with contact information and advised to call to schedule appt.   Lung Cancer Screening: (Low Dose CT Chest recommended if Age 7-80 years, 30 pack-year currently smoking OR have quit w/in 15years.) does not qualify.   Additional Screening:  Hepatitis C Screening: no longer required.   Vision Screening: Recommended annual ophthalmology exams for early detection of glaucoma and other disorders of the eye. Is the patient up to date with their annual eye exam?  Yes  Who is the provider or what is the name of the office in which the pt attends annual eye exams? [place] Screening: Recommended annual dental exams for proper oral hygiene  Community Resource Referral:  CRR required this visit?  No       Plan:     I have personally reviewed and addressed the Medicare Annual Wellness questionnaire and have noted the following in the patient's chart:  A. Medical and social history B. Use of alcohol, tobacco or illicit drugs  C. Current medications and supplements D. Functional ability and status E.  Nutritional status F.  Physical activity G. Advance directives H. List of other physicians  I.  Hospitalizations, surgeries, and ER visits in previous 12 months J.  [place] such as hearing and vision if needed, cognitive and depression L. Referrals and appointments   In addition, I have reviewed and discussed with patient certain preventive protocols, quality metrics, and best practice recommendations. A written personalized care plan for preventive services as well as general preventive health recommendations were provided to patient.   Signed,  Clemetine Marker, LPN Nurse Health Advisor   Nurse Notes: pt c/o difficulty sleeping 3-4 times per week falling asleep and going back to sleep after waking to use the bathroom. She is very anxious about Covid-19 pandemic but managing the best she can. Pt requested refills for some of her medication, advised to call pharmacy so refills can be requested electronically. Pt voiced understanding.

## 2019-01-06 NOTE — Patient Instructions (Signed)
Angela Pope , Thank you for taking time to come for your Medicare Wellness Visit. I appreciate your ongoing commitment to your health goals. Please review the following plan we discussed and let me know if I can assist you in the future.   Screening recommendations/referrals: Colonoscopy: done 07/31/13 Mammogram: done 04/09/18. Please call [phone] to schedule your mammogram and bone density screening.  Recommended yearly ophthalmology/optometry visit for glaucoma screening and checkup Recommended yearly dental visit for hygiene and checkup  Vaccinations: Influenza vaccine: done 03/18/18 Pneumococcal vaccine: done 2012 Tdap vaccine: done 2012 Shingles vaccine: Shingrix discussed. Please contact your pharmacy for coverage information.   Advanced directives: Please bring a copy of your health care power of attorney and living will to the office at your convenience.  Conditions/risks identified: Recommend drinking 6-8 glasses of water per day  Next appointment: Please follow up in one year for your Medicare Annual Wellness visit.     Preventive Care 34 Years and Older, Female Preventive care refers to lifestyle choices and visits with your health care provider that can promote health and wellness. What does preventive care include?  A yearly physical exam. This is also called an annual well check.  Dental exams once or twice a year.  Routine eye exams. Ask your health care provider how often you should have your eyes checked.  Personal lifestyle choices, including:  Daily care of your teeth and gums.  Regular physical activity.  Eating a healthy diet.  Avoiding tobacco and drug use.  Limiting alcohol use.  Practicing safe sex.  Taking low-dose aspirin every day.  Taking vitamin and mineral supplements as recommended by your health care provider. What happens during an annual well check? The services and screenings done by your health care provider during your annual  well check will depend on your age, overall health, lifestyle risk factors, and family history of disease. Counseling  Your health care provider may ask you questions about your:  Alcohol use.  Tobacco use.  Drug use.  Emotional well-being.  Home and relationship well-being.  Sexual activity.  Eating habits.  History of falls.  Memory and ability to understand (cognition).  Work and work Statistician.  Reproductive health. Screening  You may have the following tests or measurements:  Height, weight, and BMI.  Blood pressure.  Lipid and cholesterol levels. These may be checked every 5 years, or more frequently if you are over 78 years old.  Skin check.  Lung cancer screening. You may have this screening every year starting at age 26 if you have a 30-pack-year history of smoking and currently smoke or have quit within the past 15 years.  Fecal occult blood test (FOBT) of the stool. You may have this test every year starting at age 41.  Flexible sigmoidoscopy or colonoscopy. You may have a sigmoidoscopy every 5 years or a colonoscopy every 10 years starting at age 55.  Hepatitis C blood test.  Hepatitis B blood test.  Sexually transmitted disease (STD) testing.  Diabetes screening. This is done by checking your blood sugar (glucose) after you have not eaten for a while (fasting). You may have this done every 1-3 years.  Bone density scan. This is done to screen for osteoporosis. You may have this done starting at age 61.  Mammogram. This may be done every 1-2 years. Talk to your health care provider about how often you should have regular mammograms. Talk with your health care provider about your test results, treatment options, and if necessary,  the need for more tests. Vaccines  Your health care provider may recommend certain vaccines, such as:  Influenza vaccine. This is recommended every year.  Tetanus, diphtheria, and acellular pertussis (Tdap, Td) vaccine.  You may need a Td booster every 10 years.  Zoster vaccine. You may need this after age 33.  Pneumococcal 13-valent conjugate (PCV13) vaccine. One dose is recommended after age 27.  Pneumococcal polysaccharide (PPSV23) vaccine. One dose is recommended after age 82. Talk to your health care provider about which screenings and vaccines you need and how often you need them. This information is not intended to replace advice given to you by your health care provider. Make sure you discuss any questions you have with your health care provider. Document Released: 06/11/2015 Document Revised: 02/02/2016 Document Reviewed: 03/16/2015 Elsevier Interactive Patient Education  2017 [place] Prevention in the Home Falls can cause injuries. They can happen to people of all ages. There are many things you can do to make your home safe and to help prevent falls. What can I do on the outside of my home?  Regularly fix the edges of walkways and driveways and fix any cracks.  Remove anything that might make you trip as you walk through a door, such as a raised step or threshold.  Trim any bushes or trees on the path to your home.  Use bright outdoor lighting.  Clear any walking paths of anything that might make someone trip, such as rocks or tools.  Regularly check to see if handrails are loose or broken. Make sure that both sides of any steps have handrails.  Any raised decks and porches should have guardrails on the edges.  Have any leaves, snow, or ice cleared regularly.  Use sand or salt on walking paths during winter.  Clean up any spills in your garage right away. This includes oil or grease spills. What can I do in the bathroom?  Use night lights.  Install grab bars by the toilet and in the tub and shower. Do not use towel bars as grab bars.  Use non-skid mats or decals in the tub or shower.  If you need to sit down in the shower, use a plastic, non-slip stool.  Keep the  floor dry. Clean up any water that spills on the floor as soon as it happens.  Remove soap buildup in the tub or shower regularly.  Attach bath mats securely with double-sided non-slip rug tape.  Do not have throw rugs and other things on the floor that can make you trip. What can I do in the bedroom?  Use night lights.  Make sure that you have a light by your bed that is easy to reach.  Do not use any sheets or blankets that are too big for your bed. They should not hang down onto the floor.  Have a firm chair that has side arms. You can use this for support while you get dressed.  Do not have throw rugs and other things on the floor that can make you trip. What can I do in the kitchen?  Clean up any spills right away.  Avoid walking on wet floors.  Keep items that you use a lot in easy-to-reach places.  If you need to reach something above you, use a strong step stool that has a grab bar.  Keep electrical cords out of the way.  Do not use floor polish or wax that makes floors slippery. If you must use  wax, use non-skid floor wax.  Do not have throw rugs and other things on the floor that can make you trip. What can I do with my stairs?  Do not leave any items on the stairs.  Make sure that there are handrails on both sides of the stairs and use them. Fix handrails that are broken or loose. Make sure that handrails are as long as the stairways.  Check any carpeting to make sure that it is firmly attached to the stairs. Fix any carpet that is loose or worn.  Avoid having throw rugs at the top or bottom of the stairs. If you do have throw rugs, attach them to the floor with carpet tape.  Make sure that you have a light switch at the top of the stairs and the bottom of the stairs. If you do not have them, ask someone to add them for you. What else can I do to help prevent falls?  Wear shoes that:  Do not have high heels.  Have rubber bottoms.  Are comfortable and fit  you well.  Are closed at the toe. Do not wear sandals.  If you use a stepladder:  Make sure that it is fully opened. Do not climb a closed stepladder.  Make sure that both sides of the stepladder are locked into place.  Ask someone to hold it for you, if possible.  Clearly mark and make sure that you can see:  Any grab bars or handrails.  First and last steps.  Where the edge of each step is.  Use tools that help you move around (mobility aids) if they are needed. These include:  Canes.  Walkers.  Scooters.  Crutches.  Turn on the lights when you go into a dark area. Replace any light bulbs as soon as they burn out.  Set up your furniture so you have a clear path. Avoid moving your furniture around.  If any of your floors are uneven, fix them.  If there are any pets around you, be aware of where they are.  Review your medicines with your doctor. Some medicines can make you feel dizzy. This can increase your chance of falling. Ask your doctor what other things that you can do to help prevent falls. This information is not intended to replace advice given to you by your health care provider. Make sure you discuss any questions you have with your health care provider. Document Released: 03/11/2009 Document Revised: 10/21/2015 Document Reviewed: 06/19/2014 Elsevier Interactive Patient Education  2017 [employer].

## 2019-02-05 ENCOUNTER — Encounter: Payer: Self-pay | Admitting: Family Medicine

## 2019-02-05 ENCOUNTER — Other Ambulatory Visit: Payer: Self-pay

## 2019-02-05 ENCOUNTER — Ambulatory Visit (INDEPENDENT_AMBULATORY_CARE_PROVIDER_SITE_OTHER): Payer: Medicare Other | Admitting: Family Medicine

## 2019-02-05 VITALS — BP 122/82 | HR 68 | Ht 65.0 in | Wt 181.0 lb

## 2019-02-05 DIAGNOSIS — E782 Mixed hyperlipidemia: Secondary | ICD-10-CM | POA: Diagnosis not present

## 2019-02-05 DIAGNOSIS — I10 Essential (primary) hypertension: Secondary | ICD-10-CM

## 2019-02-05 DIAGNOSIS — E785 Hyperlipidemia, unspecified: Secondary | ICD-10-CM | POA: Diagnosis not present

## 2019-02-05 DIAGNOSIS — Z23 Encounter for immunization: Secondary | ICD-10-CM

## 2019-02-05 DIAGNOSIS — R69 Illness, unspecified: Secondary | ICD-10-CM

## 2019-02-05 DIAGNOSIS — R739 Hyperglycemia, unspecified: Secondary | ICD-10-CM

## 2019-02-05 DIAGNOSIS — E663 Overweight: Secondary | ICD-10-CM | POA: Diagnosis not present

## 2019-02-05 MED ORDER — LOSARTAN POTASSIUM 100 MG PO TABS: 100.0000 mg | ORAL_TABLET | Freq: Every day | ORAL | 1 refills | Status: DC

## 2019-02-05 MED ORDER — SIMVASTATIN 40 MG PO TABS: 40.0000 mg | ORAL_TABLET | Freq: Every day | ORAL | 1 refills | Status: DC

## 2019-02-05 MED ORDER — HYDROCHLOROTHIAZIDE 25 MG PO TABS: 25.0000 mg | ORAL_TABLET | Freq: Every day | ORAL | 1 refills | Status: DC

## 2019-02-05 NOTE — Progress Notes (Signed)
Date:  02/05/2019   Name:  Angela Pope   DOB:  Jul 17, 1940   MRN:  [id number]   Chief Complaint: Hypertension, Hyperlipidemia, prediabetic (diet controlled), and influenza vacc need (PHQ9=4)  Hypertension This is a chronic problem. The current episode started more than 1 year ago. The problem has been gradually worsening since onset. The problem is controlled. Pertinent negatives include no anxiety, blurred vision, chest pain, headaches, malaise/fatigue, neck pain, orthopnea, palpitations, peripheral edema, PND, shortness of breath or sweats. There are no associated agents to hypertension. Risk factors for coronary artery disease include dyslipidemia, post-menopausal state and obesity. Past treatments include angiotensin blockers and diuretics. There are no compliance problems.  There is no history of angina, kidney disease, CAD/MI, CVA, heart failure, left ventricular hypertrophy, PVD or retinopathy. There is no history of chronic renal disease, a hypertension causing med or renovascular disease.  Hyperlipidemia This is a chronic problem. The current episode started more than 1 year ago. The problem is controlled. Recent lipid tests were reviewed and are normal. She has no history of chronic renal disease. Factors aggravating her hyperlipidemia include thiazides. Pertinent negatives include no chest pain, focal sensory loss, focal weakness, leg pain, myalgias or shortness of breath. Current antihyperlipidemic treatment includes statins. The current treatment provides moderate improvement of lipids. There are no compliance problems.  Risk factors for coronary artery disease include dyslipidemia, hypertension and post-menopausal.    Review of Systems  Constitutional: Negative.  Negative for chills, fatigue, fever, malaise/fatigue and unexpected weight change.  HENT: Negative for congestion, ear discharge, ear pain, rhinorrhea, sinus pressure, sneezing and sore throat.   Eyes: Negative for blurred  vision, photophobia, pain, discharge, redness and itching.  Respiratory: Negative for cough, shortness of breath, wheezing and stridor.   Cardiovascular: Negative for chest pain, palpitations, orthopnea and PND.  Gastrointestinal: Negative for abdominal pain, blood in stool, constipation, diarrhea, nausea and vomiting.  Endocrine: Negative for cold intolerance, heat intolerance, polydipsia, polyphagia and polyuria.  Genitourinary: Negative for dysuria, flank pain, frequency, hematuria, menstrual problem, pelvic pain, urgency, vaginal bleeding and vaginal discharge.  Musculoskeletal: Negative for arthralgias, back pain, myalgias and neck pain.  Skin: Negative for rash.  Allergic/Immunologic: Negative for environmental allergies and food allergies.  Neurological: Negative for dizziness, focal weakness, weakness, light-headedness, numbness and headaches.  Hematological: Negative for adenopathy. Does not bruise/bleed easily.  Psychiatric/Behavioral: Negative for dysphoric mood. The patient is not nervous/anxious.     Patient Active Problem List   Diagnosis Date Noted  . Osteoarthritis 09/17/2017  . 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 Bilateral lower extremity edema 01/14/2015  . HTN (hypertension) 01/13/2015  . Diabetes mellitus type 2, controlled, without complications ([place]) [id number]  . Hyperlipidemia 01/13/2015  . Overweight (BMI 25.0-29.9) 01/13/2015  . Allergic rhinitis 01/13/2015  . Hx of colonic polyps 01/13/2015  . 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 Vitamin D deficiency 01/13/2015  . FH: heart disease 01/13/2015  . FH: stroke 01/13/2015    Allergies  Allergen Reactions  . Clarithromycin   . Sulfa Antibiotics   . Tetracyclines & Related     Past Surgical History:  Procedure Laterality Date  . COLONOSCOPY  2015  . large intestine surgery    . SMALL INTESTINE SURGERY  2012   polyp that could't be removed    Social History    Tobacco Use  . Smoking status: Former Smoker    Packs/day: 0.25    Years: 10.00  Pack years: 2.50    Types: Cigarettes    Quit date: 10/18/1965    Years since quitting: 53.3  . Smokeless tobacco: Never Used  . Tobacco comment: smoking cessation materials not required  Substance Use Topics  . Alcohol use: Not Currently    Alcohol/week: 0.0 standard drinks  . Drug use: No     Medication list has been reviewed and updated.  Current Meds  Medication Sig  . glucosamine-chondroitin (MAX GLUCOSAMINE CHONDROITIN) 500-400 MG tablet Take 1 tablet by mouth 2 (two) times daily.  Marland Kitchen glucose blood test strip Use to check Blood Sugar 3 times daily for ICD10: E11.9(One touch ultra blue brand. )  . hydrochlorothiazide (HYDRODIURIL) 25 MG tablet Take 1 tablet (25 mg total) by mouth daily.  . Lancets (ONETOUCH ULTRASOFT) lancets Use to check Blood Sugar 3 times daily for ICD 10:  E11.9  (Touch Twist Lancets for Ultra Touch)  . loperamide (IMODIUM) 2 MG capsule Take 2 mg by mouth as needed for diarrhea or loose stools.  Marland Kitchen losartan (COZAAR) 100 MG tablet Take 1 tablet (100 mg total) by mouth daily.  . Multiple Vitamin (MULTIVITAMIN) capsule Take 1 capsule by mouth daily.  . simvastatin (ZOCOR) 40 MG tablet Take 1 tablet (40 mg total) by mouth daily.  Marland Kitchen triamcinolone (NASACORT AQ) 55 MCG/ACT AERO nasal inhaler Place 2 sprays into the nose daily.    PHQ 2/9 Scores 02/05/2019 01/06/2019 07/29/2018 10/31/2017  PHQ - 2 Score 2 2 1  0  PHQ- 9 Score 4 4 6  0    BP Readings from Last 3 Encounters:  02/05/19 122/82  01/06/19 132/82  08/23/18 136/80    Physical Exam Vitals signs and nursing note reviewed.  Constitutional:      Appearance: She is well-developed.  HENT:     Head: Normocephalic.     Right Ear: Tympanic membrane, ear canal and external ear normal.     Left Ear: Tympanic membrane, ear canal and external ear normal.     Nose: Nose normal.     Mouth/Throat:     Mouth: Mucous membranes are  moist.  Eyes:     General: Lids are everted, no foreign bodies appreciated. No scleral icterus.       Left eye: No foreign body or hordeolum.     Conjunctiva/sclera: Conjunctivae normal.     Right eye: Right conjunctiva is not injected.     Left eye: Left conjunctiva is not injected.     Pupils: Pupils are equal, round, and reactive to light.  Neck:     Musculoskeletal: Normal range of motion and neck supple.     Thyroid: No thyromegaly.     Vascular: No JVD.     Trachea: No tracheal deviation.  Cardiovascular:     Rate and Rhythm: Normal rate and regular rhythm.     Pulses: Normal pulses.     Heart sounds: Normal heart sounds. No murmur. No friction rub. No gallop.   Pulmonary:     Effort: Pulmonary effort is normal. No respiratory distress.     Breath sounds: Normal breath sounds. No wheezing, rhonchi or rales.  Abdominal:     General: Bowel sounds are normal.     Palpations: Abdomen is soft. There is no mass.     Tenderness: There is no abdominal tenderness. There is no guarding or rebound.     Hernia: No hernia is present.  Musculoskeletal: Normal range of motion.        General: No tenderness.  Lymphadenopathy:     Cervical: No cervical adenopathy.  Skin:    General: Skin is warm.     Capillary Refill: Capillary refill takes less than 2 seconds.     Coloration: Skin is not jaundiced or pale.     Findings: No bruising, lesion or rash.  Neurological:     Mental Status: She is alert and oriented to person, place, and time.     Cranial Nerves: No cranial nerve deficit.     Deep Tendon Reflexes: Reflexes normal.  Psychiatric:        Mood and Affect: Mood is not anxious or depressed.     Wt Readings from Last 3 Encounters:  02/05/19 181 lb (82.1 kg)  01/06/19 181 lb (82.1 kg)  08/23/18 174 lb (78.9 kg)    BP 122/82   Pulse 68   Ht 5\' 5"  (1.651 m)   Wt 181 lb (82.1 kg)   BMI 30.12 kg/m   Assessment and Plan:  1. Essential hypertension Chronic.  Controlled.   Continue hydrochlorothiazide 25 mg once a day.  Will check renal function panel.  Will recheck in 6 months. - hydrochlorothiazide (HYDRODIURIL) 25 MG tablet; Take 1 tablet (25 mg total) by mouth daily.  Dispense: 90 tablet; Refill: 1 - Renal Function Panel  2. Mixed hyperlipidemia Chronic.  Controlled.  Will continue simvastatin 40 mg once a day.  Will check lipid panel. - simvastatin (ZOCOR) 40 MG tablet; Take 1 tablet (40 mg total) by mouth daily.  Dispense: 90 tablet; Refill: 1     3. Hyperglycemia Patient with history of hyperglycemia and hyperlipidemia with some central weight deposition.  This is consistent with prediabetes we will check an A1c to see if this is trending in this range. - HgB A1c  4. Taking medication for chronic disease Patient on statin agent and will check hepatic function panel to rule out hepatotoxicity. - Hepatic function panel  5. Influenza vaccine needed Discussed and administered. - Flu Vaccine QUAD High Dose(Fluad)  6. Overweight Health risks of being over weight were discussed and patient was counseled on weight loss options and exercise.

## 2019-02-06 ENCOUNTER — Other Ambulatory Visit: Payer: Self-pay

## 2019-02-06 DIAGNOSIS — E119 Type 2 diabetes mellitus without complications: Secondary | ICD-10-CM

## 2019-02-06 LAB — HEPATIC FUNCTION PANEL
ALT: 20 IU/L (ref 0–32)
AST: 21 IU/L (ref 0–40)
Alkaline Phosphatase: 73 IU/L (ref 39–117)
Bilirubin Total: 2 mg/dL — ABNORMAL HIGH (ref 0.0–1.2)
Bilirubin, Direct: 0.37 mg/dL (ref 0.00–0.40)
Total Protein: 6.6 g/dL (ref 6.0–8.5)

## 2019-02-06 LAB — LIPID PANEL WITH LDL/HDL RATIO
Cholesterol, Total: 166 mg/dL (ref 100–199)
HDL: 48 mg/dL (ref >39)
LDL Chol Calc (NIH): 86 mg/dL (ref 0–99)
LDL/HDL Ratio: 1.8 ratio (ref 0.0–3.2)
Triglycerides: 192 mg/dL — ABNORMAL HIGH (ref 0–149)
VLDL Cholesterol Cal: 32 mg/dL (ref 5–40)

## 2019-02-06 LAB — RENAL FUNCTION PANEL
Albumin: 4.3 g/dL (ref 3.7–4.7)
BUN/Creatinine Ratio: 16 (ref 12–28)
BUN: 12 mg/dL (ref 8–27)
CO2: 23 mmol/L (ref 20–29)
Calcium: 10 mg/dL (ref 8.7–10.3)
Chloride: 99 mmol/L (ref 96–106)
Creatinine, Ser: 0.77 mg/dL (ref 0.57–1.00)
GFR calc Af Amer: 86 mL/min/{1.73_m2} (ref >59)
GFR calc non Af Amer: 75 mL/min/{1.73_m2} (ref >59)
Glucose: 188 mg/dL — ABNORMAL HIGH (ref 65–99)
Phosphorus: 3.6 mg/dL (ref 3.0–4.3)
Potassium: 3.8 mmol/L (ref 3.5–5.2)
Sodium: 143 mmol/L (ref 134–144)

## 2019-02-06 LAB — HEMOGLOBIN A1C
Est. average glucose Bld gHb Est-mCnc: 154 mg/dL
Hgb A1c MFr Bld: 7 % — ABNORMAL HIGH (ref 4.8–5.6)

## 2019-02-06 MED ORDER — METFORMIN HCL 500 MG PO TABS: 500.0000 mg | ORAL_TABLET | Freq: Two times a day (BID) | ORAL | 1 refills | Status: DC

## 2019-02-06 NOTE — Progress Notes (Unsigned)
Sent in metformin

## 2019-02-27 LAB — HM DIABETES EYE EXAM

## 2019-03-18 ENCOUNTER — Other Ambulatory Visit: Payer: Self-pay

## 2019-03-18 ENCOUNTER — Encounter: Payer: Self-pay | Admitting: Family Medicine

## 2019-03-18 ENCOUNTER — Ambulatory Visit (INDEPENDENT_AMBULATORY_CARE_PROVIDER_SITE_OTHER): Payer: Medicare Other | Admitting: Family Medicine

## 2019-03-18 VITALS — BP 102/77 | HR 61 | Resp 16 | Ht 65.0 in | Wt 180.0 lb

## 2019-03-18 DIAGNOSIS — G8929 Other chronic pain: Secondary | ICD-10-CM

## 2019-03-18 DIAGNOSIS — M25551 Pain in right hip: Secondary | ICD-10-CM

## 2019-03-18 DIAGNOSIS — E119 Type 2 diabetes mellitus without complications: Secondary | ICD-10-CM

## 2019-03-18 DIAGNOSIS — E782 Mixed hyperlipidemia: Secondary | ICD-10-CM | POA: Diagnosis not present

## 2019-03-18 DIAGNOSIS — E785 Hyperlipidemia, unspecified: Secondary | ICD-10-CM | POA: Diagnosis not present

## 2019-03-18 DIAGNOSIS — I10 Essential (primary) hypertension: Secondary | ICD-10-CM | POA: Diagnosis not present

## 2019-03-18 MED ORDER — SIMVASTATIN 40 MG PO TABS: 40.0000 mg | ORAL_TABLET | Freq: Every day | ORAL | 1 refills | Status: DC

## 2019-03-18 MED ORDER — METFORMIN HCL 500 MG PO TABS: 500.0000 mg | ORAL_TABLET | Freq: Two times a day (BID) | ORAL | 1 refills | Status: DC

## 2019-03-18 MED ORDER — HYDROCHLOROTHIAZIDE 25 MG PO TABS: 25.0000 mg | ORAL_TABLET | Freq: Every day | ORAL | 1 refills | Status: DC

## 2019-03-18 MED ORDER — LOSARTAN POTASSIUM 100 MG PO TABS: 100.0000 mg | ORAL_TABLET | Freq: Every day | ORAL | 1 refills | Status: DC

## 2019-03-18 MED ORDER — GLUCOSAMINE-CHONDROITIN 500-400 MG PO TABS: 1.0000 | ORAL_TABLET | Freq: Two times a day (BID) | ORAL | 3 refills | Status: DC

## 2019-03-18 NOTE — Progress Notes (Signed)
Date:  03/18/2019   Name:  Angela Pope   DOB:  Jan 25, 1941   MRN:  [id number]   Chief Complaint: Diabetes (BS- 120-150), Hypertension, and Hyperlipidemia  Diabetes She presents for her follow-up diabetic visit. She has type 2 diabetes mellitus. Her disease course has been stable. There are no hypoglycemic associated symptoms. Pertinent negatives for hypoglycemia include no dizziness, headaches, nervousness/anxiousness or sweats. There are no diabetic associated symptoms. Pertinent negatives for diabetes include no blurred vision, no chest pain, no fatigue, no polydipsia, no polyphagia, no polyuria and no weakness. There are no hypoglycemic complications. Symptoms are stable. There are no diabetic complications. Pertinent negatives for diabetic complications include no CVA, PVD or retinopathy. Risk factors for coronary artery disease include dyslipidemia, diabetes mellitus and hypertension. Current diabetic treatment includes oral agent (monotherapy) and diet. She is compliant with treatment all of the time. She is following a generally healthy diet. Meal planning includes avoidance of concentrated sweets and carbohydrate counting. She participates in exercise daily. Her home blood glucose trend is fluctuating minimally. Her breakfast blood glucose range is generally 110-130 mg/dl. An ACE inhibitor/angiotensin II receptor blocker is being taken. She does not see a podiatrist.Eye exam is current.  Hypertension This is a chronic problem. The current episode started more than 1 year ago. The problem is unchanged. The problem is controlled. Pertinent negatives include no anxiety, blurred vision, chest pain, headaches, malaise/fatigue, neck pain, orthopnea, palpitations, peripheral edema, PND, shortness of breath or sweats. Risk factors for coronary artery disease include diabetes mellitus and dyslipidemia. Past treatments include angiotensin blockers and diuretics. The current treatment provides moderate  improvement. There are no compliance problems.  There is no history of angina, kidney disease, CAD/MI, CVA, heart failure, left ventricular hypertrophy, PVD or retinopathy. There is no history of chronic renal disease, a hypertension causing med or renovascular disease.  Hyperlipidemia This is a chronic problem. The problem is controlled. Recent lipid tests were reviewed and are normal. Exacerbating diseases include diabetes. She has no history of chronic renal disease. Factors aggravating her hyperlipidemia include thiazides. Pertinent negatives include no chest pain, focal sensory loss, focal weakness, leg pain, myalgias or shortness of breath. Current antihyperlipidemic treatment includes statins. The current treatment provides moderate improvement of lipids. There are no compliance problems.   Leg Pain  Incident onset: yearly. There was no injury mechanism. The pain is present in the left hip. The pain is moderate. The pain has been intermittent since onset. Pertinent negatives include no numbness.    Review of Systems  Constitutional: Negative.  Negative for chills, fatigue, fever, malaise/fatigue and unexpected weight change.  HENT: Negative for congestion, ear discharge, ear pain, rhinorrhea, sinus pressure, sneezing and sore throat.   Eyes: Negative for blurred vision, photophobia, pain, discharge, redness and itching.  Respiratory: Negative for cough, shortness of breath, wheezing and stridor.   Cardiovascular: Negative for chest pain, palpitations, orthopnea and PND.  Gastrointestinal: Negative for abdominal pain, blood in stool, constipation, diarrhea, nausea and vomiting.  Endocrine: Negative for cold intolerance, heat intolerance, polydipsia, polyphagia and polyuria.  Genitourinary: Negative for dysuria, flank pain, frequency, hematuria, menstrual problem, pelvic pain, urgency, vaginal bleeding and vaginal discharge.  Musculoskeletal: Negative for arthralgias, back pain, myalgias and  neck pain.  Skin: Negative for rash.  Allergic/Immunologic: Negative for environmental allergies and food allergies.  Neurological: Negative for dizziness, focal weakness, weakness, light-headedness, numbness and headaches.  Hematological: Negative for adenopathy. Does not bruise/bleed easily.  Psychiatric/Behavioral: Negative for dysphoric mood. The patient  is not nervous/anxious.     Patient Active Problem List   Diagnosis Date Noted  . Osteoarthritis 09/17/2017  . 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 Bilateral lower extremity edema 01/14/2015  . HTN (hypertension) 01/13/2015  . Diabetes mellitus type 2, controlled, without complications ([place]) [id number]  . Hyperlipidemia 01/13/2015  . Overweight (BMI 25.0-29.9) 01/13/2015  . Allergic rhinitis 01/13/2015  . Hx of colonic polyps 01/13/2015  . 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 Vitamin D deficiency 01/13/2015  . FH: heart disease 01/13/2015  . FH: stroke 01/13/2015  . Incomplete emptying of bladder 07/02/2009  . 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 Clarithromycin   . Sulfa Antibiotics   . Tetracyclines & Related     Past Surgical History:  Procedure Laterality Date  . COLONOSCOPY  2015  . large intestine surgery    . SMALL INTESTINE SURGERY  2012   polyp that could't be removed    Social History   Tobacco Use  . Smoking status: Former Smoker    Packs/day: 0.25    Years: 10.00    Pack years: 2.50    Types: Cigarettes    Quit date: 10/18/1965    Years since quitting: 53.4  . Smokeless tobacco: Never Used  . Tobacco comment: smoking cessation materials not required  Substance Use Topics  . Alcohol use: Not Currently    Alcohol/week: 0.0 standard drinks  . Drug use: No     Medication list has been reviewed and updated.  Current Meds  Medication Sig  . glucosamine-chondroitin (MAX GLUCOSAMINE CHONDROITIN)  500-400 MG tablet Take 1 tablet by mouth 2 (two) times daily.  Marland Kitchen glucose blood test strip Use to check Blood Sugar 3 times daily for ICD10: E11.9(One touch ultra blue brand. )  . hydrochlorothiazide (HYDRODIURIL) 25 MG tablet Take 1 tablet (25 mg total) by mouth daily.  . Lancets (ONETOUCH ULTRASOFT) lancets Use to check Blood Sugar 3 times daily for ICD 10:  E11.9  (Touch Twist Lancets for Ultra Touch)  . loperamide (IMODIUM) 2 MG capsule Take 2 mg by mouth as needed for diarrhea or loose stools.  Marland Kitchen losartan (COZAAR) 100 MG tablet Take 1 tablet (100 mg total) by mouth daily.  . metFORMIN (GLUCOPHAGE) 500 MG tablet Take 1 tablet (500 mg total) by mouth 2 (two) times daily with a meal.  . Multiple Vitamin (MULTIVITAMIN) capsule Take 1 capsule by mouth daily.  . simvastatin (ZOCOR) 40 MG tablet Take 1 tablet (40 mg total) by mouth daily.  Marland Kitchen triamcinolone (NASACORT AQ) 55 MCG/ACT AERO nasal inhaler Place 2 sprays into the nose daily.    PHQ 2/9 Scores 03/18/2019 02/05/2019 01/06/2019 07/29/2018  PHQ - 2 Score 2 2 2 1   PHQ- 9 Score 7 4 4 6     BP Readings from Last 3 Encounters:  03/18/19 102/77  02/05/19 122/82  01/06/19 132/82    Physical Exam Vitals signs and nursing note reviewed.  Constitutional:      General: She is not in acute distress.    Appearance: She is not diaphoretic.  HENT:     Head: Normocephalic and atraumatic.     Right Ear: External ear normal.     Left Ear: External ear normal.     Nose: Nose normal.  Eyes:     General:        Right eye: No discharge.  Left eye: No discharge.     Conjunctiva/sclera: Conjunctivae normal.     Pupils: Pupils are equal, round, and reactive to light.  Neck:     Musculoskeletal: Normal range of motion and neck supple.     Thyroid: No thyromegaly.     Vascular: No JVD.  Cardiovascular:     Rate and Rhythm: Normal rate and regular rhythm.     Pulses:          Dorsalis pedis pulses are 2+ on the right side and 2+ on the left  side.       Posterior tibial pulses are 2+ on the right side and 2+ on the left side.     Heart sounds: Normal heart sounds. No murmur. No friction rub. No gallop.   Pulmonary:     Effort: Pulmonary effort is normal.     Breath sounds: Normal breath sounds.  Abdominal:     General: Bowel sounds are normal.     Palpations: Abdomen is soft. There is no mass.     Tenderness: There is no abdominal tenderness. There is no guarding.  Musculoskeletal: Normal range of motion.     Right foot: Normal range of motion.     Left foot: Normal range of motion.  Feet:     Right foot:     Protective Sensation: 10 sites tested. 10 sites sensed.     Skin integrity: Skin integrity normal.     Left foot:     Protective Sensation: 10 sites tested. 10 sites sensed.     Skin integrity: Skin integrity normal.  Lymphadenopathy:     Cervical: No cervical adenopathy.  Skin:    General: Skin is warm and dry.  Neurological:     Mental Status: She is alert.     Deep Tendon Reflexes: Reflexes are normal and symmetric.     Wt Readings from Last 3 Encounters:  03/18/19 180 lb (81.6 kg)  02/05/19 181 lb (82.1 kg)  01/06/19 181 lb (82.1 kg)    BP 102/77   Pulse 61   Resp 16   Ht 5\' 5"  (1.651 m)   Wt 180 lb (81.6 kg)   SpO2 98%   BMI 29.95 kg/m   Assessment and Plan:  1. Chronic right hip pain Patient has had a history of right hip pain and now the left hip is hurting in the past she is taking glucosamine/chondroitin 1 tablet twice a day.  Patient has not been taking this and this might be why she has had a flareup and therefore we will resume this as well as taking Tylenol on a as needed basis. - glucosamine-chondroitin (MAX GLUCOSAMINE CHONDROITIN) 500-400 MG tablet; Take 1 tablet by mouth 2 (two) times daily.  Dispense: 90 tablet; Refill: 3  2. Diabetes mellitus, new onset ([place]) Chronic.  Controlled.  Stable.  Will continue metformin 500 mg 1 twice a day.  Recheck last A1c last month was noted to  be 7 and diet was reemphasized. - metFORMIN (GLUCOPHAGE) 500 MG tablet; Take 1 tablet (500 mg total) by mouth 2 (two) times daily with a meal.  Dispense: 180 tablet; Refill: 1  3. Essential hypertension Chronic.  Controlled.  Stable.  Continue hydrochlorothiazide 25 mg and losartan 100 mg once a day.  Reviewed renal panel from September and this is acceptable. - hydrochlorothiazide (HYDRODIURIL) 25 MG tablet; Take 1 tablet (25 mg total) by mouth daily.  Dispense: 90 tablet; Refill: 1 - losartan (COZAAR) 100 MG tablet; Take  1 tablet (100 mg total) by mouth daily.  Dispense: 90 tablet; Refill: 1  4. Hyperlipidemia Chronic.  Controlled.  Stable.  Will continue simvastatin 40 mg once a day - simvastatin (ZOCOR) 40 MG tablet; Take 1 tablet (40 mg total) by mouth daily.  Dispense: 90 tablet; Refill: 1  5. Mixed hyperlipidemia As noted above - simvastatin (ZOCOR) 40 MG tablet; Take 1 tablet (40 mg total) by mouth daily.  Dispense: 90 tablet; Refill: 1

## 2019-04-14 ENCOUNTER — Ambulatory Visit
Admission: RE | Admit: 2019-04-14 | Discharge: 2019-04-14 | Disposition: A | Discharge status: Home / Self Care | Payer: Medicare Other | Source: Ambulatory Visit | Attending: Family Medicine | Admitting: Family Medicine

## 2019-04-14 ENCOUNTER — Other Ambulatory Visit: Payer: Self-pay

## 2019-04-14 DIAGNOSIS — Z13820 Encounter for screening for osteoporosis: Secondary | ICD-10-CM | POA: Insufficient documentation

## 2019-04-14 DIAGNOSIS — Z780 Asymptomatic menopausal state: Secondary | ICD-10-CM

## 2019-04-14 DIAGNOSIS — M85851 Other specified disorders of bone density and structure, right thigh: Secondary | ICD-10-CM | POA: Diagnosis not present

## 2019-04-14 DIAGNOSIS — Z1231 Encounter for screening mammogram for malignant neoplasm of breast: Secondary | ICD-10-CM | POA: Diagnosis not present

## 2019-04-21 ENCOUNTER — Other Ambulatory Visit: Payer: Self-pay

## 2019-04-21 DIAGNOSIS — I10 Essential (primary) hypertension: Secondary | ICD-10-CM

## 2019-04-21 MED ORDER — HYDROCHLOROTHIAZIDE 25 MG PO TABS: 25.0000 mg | ORAL_TABLET | Freq: Every day | ORAL | 0 refills | Status: DC

## 2019-06-03 ENCOUNTER — Other Ambulatory Visit: Payer: Self-pay

## 2019-06-29 DIAGNOSIS — Z23 Encounter for immunization: Secondary | ICD-10-CM | POA: Diagnosis not present

## 2019-07-21 DIAGNOSIS — Z23 Encounter for immunization: Secondary | ICD-10-CM | POA: Diagnosis not present

## 2019-10-24 ENCOUNTER — Other Ambulatory Visit: Payer: Self-pay | Admitting: Family Medicine

## 2019-10-24 DIAGNOSIS — E782 Mixed hyperlipidemia: Secondary | ICD-10-CM

## 2019-10-24 DIAGNOSIS — I10 Essential (primary) hypertension: Secondary | ICD-10-CM

## 2019-10-24 DIAGNOSIS — E785 Hyperlipidemia, unspecified: Secondary | ICD-10-CM

## 2019-10-24 MED ORDER — HYDROCHLOROTHIAZIDE 25 MG PO TABS: 25.0000 mg | ORAL_TABLET | Freq: Every day | ORAL | 0 refills | Status: DC

## 2019-10-24 MED ORDER — LOSARTAN POTASSIUM 100 MG PO TABS: 100.0000 mg | ORAL_TABLET | Freq: Every day | ORAL | 0 refills | Status: DC

## 2019-10-24 MED ORDER — SIMVASTATIN 40 MG PO TABS: 40.0000 mg | ORAL_TABLET | Freq: Every day | ORAL | 0 refills | Status: DC

## 2019-10-24 NOTE — Telephone Encounter (Signed)
Call to patient- medication follow up scheduled- courtesy RF given

## 2019-10-24 NOTE — Telephone Encounter (Signed)
Medication Refill - Medication:  losartan (COZAAR) 100 MG tablet  hydrochlorothiazide (HYDRODIURIL) 25 MG tablet simvastatin (ZOCOR) 40 MG tablet  Has the patient contacted their pharmacy? Yes advised to call office.   Preferred Pharmacy (with phone number or street name):  [facility] [place], [place] [facility] [address]  [place], [place] [address]  Phone:  [phone] Fax:  [phone]   Agent: Please be advised that RX refills may take up to 3 business days. We ask that you follow-up with your pharmacy.

## 2019-11-03 ENCOUNTER — Ambulatory Visit (INDEPENDENT_AMBULATORY_CARE_PROVIDER_SITE_OTHER): Payer: Medicare Other | Admitting: Family Medicine

## 2019-11-03 ENCOUNTER — Other Ambulatory Visit: Payer: Self-pay

## 2019-11-03 ENCOUNTER — Encounter: Payer: Self-pay | Admitting: Family Medicine

## 2019-11-03 VITALS — BP 100/70 | HR 72 | Ht 65.0 in | Wt 156.0 lb

## 2019-11-03 DIAGNOSIS — I10 Essential (primary) hypertension: Secondary | ICD-10-CM

## 2019-11-03 DIAGNOSIS — E782 Mixed hyperlipidemia: Secondary | ICD-10-CM | POA: Diagnosis not present

## 2019-11-03 DIAGNOSIS — E119 Type 2 diabetes mellitus without complications: Secondary | ICD-10-CM | POA: Diagnosis not present

## 2019-11-03 DIAGNOSIS — Z86010 Personal history of colonic polyps: Secondary | ICD-10-CM | POA: Diagnosis not present

## 2019-11-03 DIAGNOSIS — R69 Illness, unspecified: Secondary | ICD-10-CM | POA: Diagnosis not present

## 2019-11-03 MED ORDER — SIMVASTATIN 40 MG PO TABS: 40.0000 mg | ORAL_TABLET | Freq: Every day | ORAL | 0 refills | Status: DC

## 2019-11-03 MED ORDER — LOSARTAN POTASSIUM 100 MG PO TABS: 100.0000 mg | ORAL_TABLET | Freq: Every day | ORAL | 0 refills | Status: DC

## 2019-11-03 MED ORDER — METFORMIN HCL 500 MG PO TABS: 500.0000 mg | ORAL_TABLET | Freq: Two times a day (BID) | ORAL | 1 refills | Status: DC

## 2019-11-03 MED ORDER — HYDROCHLOROTHIAZIDE 25 MG PO TABS: 25.0000 mg | ORAL_TABLET | Freq: Every day | ORAL | 0 refills | Status: DC

## 2019-11-03 NOTE — Progress Notes (Signed)
Date:  11/03/2019   Name:  Angela Pope   DOB:  October 31, 1940   MRN:  [id number]   Chief Complaint: Diabetes, Hypertension, and Hyperlipidemia  Diabetes She presents for her follow-up diabetic visit. She has type 2 diabetes mellitus. Her disease course has been stable. There are no hypoglycemic associated symptoms. Pertinent negatives for hypoglycemia include no dizziness, headaches, nervousness/anxiousness or sweats. There are no diabetic associated symptoms. Pertinent negatives for diabetes include no blurred vision, no chest pain, no fatigue, no polydipsia, no polyphagia, no polyuria and no weakness. There are no hypoglycemic complications. Symptoms are stable. There are no diabetic complications. Pertinent negatives for diabetic complications include no CVA, PVD or retinopathy. Risk factors for coronary artery disease include dyslipidemia and hypertension. Current diabetic treatment includes oral agent (monotherapy). She is following a generally healthy diet. Meal planning includes avoidance of concentrated sweets and carbohydrate counting. She participates in exercise intermittently. An ACE inhibitor/angiotensin II receptor blocker is being taken. She does not see a podiatrist.Eye exam is current.  Hypertension This is a chronic problem. The current episode started more than 1 year ago. The problem is controlled. Pertinent negatives include no anxiety, blurred vision, chest pain, headaches, malaise/fatigue, neck pain, orthopnea, palpitations, peripheral edema, PND, shortness of breath or sweats. Associated agents: nasal steroid. Risk factors for coronary artery disease include diabetes mellitus. Past treatments include angiotensin blockers and diuretics. The current treatment provides moderate improvement. There are no compliance problems.  There is no history of angina, kidney disease, CAD/MI, CVA, heart failure, left ventricular hypertrophy, PVD or retinopathy. There is no history of chronic renal  disease, a hypertension causing med or renovascular disease.  Hyperlipidemia This is a chronic problem. The current episode started more than 1 year ago. The problem is controlled. Recent lipid tests were reviewed and are normal. She has no history of chronic renal disease. Pertinent negatives include no chest pain, focal sensory loss, focal weakness, leg pain, myalgias or shortness of breath. Current antihyperlipidemic treatment includes statins. The current treatment provides moderate improvement of lipids. There are no compliance problems.  Risk factors for coronary artery disease include dyslipidemia, diabetes mellitus and hypertension.    Lab Results  Component Value Date   CREATININE 0.77 02/05/2019   BUN 12 02/05/2019   NA 143 02/05/2019   K 3.8 02/05/2019   CL 99 02/05/2019   CO2 23 02/05/2019   Lab Results  Component Value Date   CHOL 166 02/05/2019   HDL 48 02/05/2019   LDLCALC 86 02/05/2019   TRIG 192 (H) 02/05/2019   CHOLHDL 3.4 03/27/2018   Lab Results  Component Value Date   TSH 1.260 03/27/2017   Lab Results  Component Value Date   HGBA1C 7.0 (H) 02/05/2019   Lab Results  Component Value Date   WBC 6.6 03/27/2018   HGB 16.1 (H) 03/27/2018   HCT 48.0 (H) 03/27/2018   MCV 93 03/27/2018   PLT 319 03/27/2018   Lab Results  Component Value Date   ALT 20 02/05/2019   AST 21 02/05/2019   ALKPHOS 73 02/05/2019   BILITOT 2.0 (H) 02/05/2019     Review of Systems  Constitutional: Negative.  Negative for chills, fatigue, fever, malaise/fatigue and unexpected weight change.  HENT: Negative for congestion, ear discharge, ear pain, hearing loss, mouth sores, nosebleeds, rhinorrhea, sinus pressure, sneezing and sore throat.   Eyes: Negative for blurred vision, photophobia, pain, discharge, redness and itching.  Respiratory: Negative for cough, shortness of breath, wheezing and stridor.  Cardiovascular: Negative for chest pain, palpitations, orthopnea and PND.    Gastrointestinal: Positive for diarrhea. Negative for abdominal distention, abdominal pain, anal bleeding, blood in stool, constipation, nausea, rectal pain and vomiting.  Endocrine: Negative for cold intolerance, heat intolerance, polydipsia, polyphagia and polyuria.  Genitourinary: Negative for dysuria, flank pain, frequency, hematuria, menstrual problem, pelvic pain, urgency, vaginal bleeding and vaginal discharge.  Musculoskeletal: Negative for arthralgias, back pain, myalgias and neck pain.  Skin: Negative for rash.  Allergic/Immunologic: Negative for environmental allergies and food allergies.  Neurological: Negative for dizziness, focal weakness, weakness, light-headedness, numbness and headaches.  Hematological: Negative for adenopathy. Does not bruise/bleed easily.  Psychiatric/Behavioral: Negative for dysphoric mood. The patient is not nervous/anxious.     Patient Active Problem List   Diagnosis Date Noted  . Osteoarthritis 09/17/2017  . 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 Bilateral lower extremity edema 01/14/2015  . HTN (hypertension) 01/13/2015  . Diabetes mellitus type 2, controlled, without complications ([place]) 63/05/6008  . Hyperlipidemia 01/13/2015  . Overweight (BMI 25.0-29.9) 01/13/2015  . Allergic rhinitis 01/13/2015  . Hx of colonic polyps 01/13/2015  . 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 Vitamin D deficiency 01/13/2015  . FH: heart disease 01/13/2015  . FH: stroke 01/13/2015  . Incomplete emptying of bladder 07/02/2009  . 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 Clarithromycin   . Sulfa Antibiotics   . Tetracyclines & Related     Past Surgical History:  Procedure Laterality Date  . COLONOSCOPY  2015  . large intestine surgery    . SMALL INTESTINE SURGERY  2012   polyp that could't be removed    Social History   Tobacco Use  . Smoking status: Former  Smoker    Packs/day: 0.25    Years: 10.00    Pack years: 2.50    Types: Cigarettes    Quit date: 10/18/1965    Years since quitting: 54.0  . Smokeless tobacco: Never Used  . Tobacco comment: smoking cessation materials not required  Substance Use Topics  . Alcohol use: Not Currently    Alcohol/week: 0.0 standard drinks  . Drug use: No     Medication list has been reviewed and updated.  Current Meds  Medication Sig  . glucose blood test strip Use to check Blood Sugar 3 times daily for ICD10: E11.9(One touch ultra blue brand. )  . hydrochlorothiazide (HYDRODIURIL) 25 MG tablet Take 1 tablet (25 mg total) by mouth daily.  . Lancets (ONETOUCH ULTRASOFT) lancets Use to check Blood Sugar 3 times daily for ICD 10:  E11.9  (Touch Twist Lancets for Ultra Touch)  . loperamide (IMODIUM) 2 MG capsule Take 2 mg by mouth as needed for diarrhea or loose stools.  Marland Kitchen losartan (COZAAR) 100 MG tablet Take 1 tablet (100 mg total) by mouth daily.  . metFORMIN (GLUCOPHAGE) 500 MG tablet Take 1 tablet (500 mg total) by mouth 2 (two) times daily with a meal.  . Multiple Vitamin (MULTIVITAMIN) capsule Take 1 capsule by mouth daily.  . simvastatin (ZOCOR) 40 MG tablet Take 1 tablet (40 mg total) by mouth daily.    PHQ 2/9 Scores 11/03/2019 03/18/2019 02/05/2019 01/06/2019  PHQ - 2 Score 1 2 2 2   PHQ- 9 Score 1 7 4 4     BP Readings from Last 3 Encounters:  11/03/19 100/70  03/18/19 102/77  02/05/19 122/82    Physical Exam Vitals and  nursing note reviewed.  Constitutional:      Appearance: She is well-developed.  HENT:     Head: Normocephalic.     Right Ear: External ear normal.     Left Ear: External ear normal.  Eyes:     General: Lids are everted, no foreign bodies appreciated. No scleral icterus.       Left eye: No foreign body or hordeolum.     Conjunctiva/sclera: Conjunctivae normal.     Right eye: Right conjunctiva is not injected.     Left eye: Left conjunctiva is not injected.      Pupils: Pupils are equal, round, and reactive to light.  Neck:     Thyroid: No thyromegaly.     Vascular: No JVD.     Trachea: No tracheal deviation.  Cardiovascular:     Rate and Rhythm: Normal rate and regular rhythm.     Heart sounds: Normal heart sounds. No murmur. No friction rub. No gallop.   Pulmonary:     Effort: Pulmonary effort is normal. No respiratory distress.     Breath sounds: Normal breath sounds. No wheezing or rales.  Abdominal:     General: Bowel sounds are normal.     Palpations: Abdomen is soft. There is no mass.     Tenderness: There is no abdominal tenderness. There is no guarding or rebound.  Musculoskeletal:        General: No tenderness. Normal range of motion.     Cervical back: Normal range of motion and neck supple.  Lymphadenopathy:     Cervical: No cervical adenopathy.  Skin:    General: Skin is warm.     Findings: No rash.  Neurological:     Mental Status: She is alert and oriented to person, place, and time.     Cranial Nerves: No cranial nerve deficit.     Deep Tendon Reflexes: Reflexes normal.  Psychiatric:        Mood and Affect: Mood is not anxious or depressed.     Wt Readings from Last 3 Encounters:  11/03/19 156 lb (70.8 kg)  03/18/19 180 lb (81.6 kg)  02/05/19 181 lb (82.1 kg)    BP 100/70   Pulse 72   Ht 5\' 5"  (1.651 m)   Wt 156 lb (70.8 kg)   BMI 25.96 kg/m   Assessment and Plan:  1. Hyperlipidemia Chronic.  Controlled.  Stable.  We will continue simvastatin 40 mg once a day.  Will obtain lipid panel to determine current status but patient is not fasting at this time.  2. Mixed hyperlipidemia As noted above. - simvastatin (ZOCOR) 40 MG tablet; Take 1 tablet (40 mg total) by mouth daily.  Dispense: 30 tablet; Refill: 0 - Lipid Panel With LDL/HDL Ratio  3. Essential hypertension Chronic.  Controlled.  Stable.  Continue hydrochlorothiazide 25 mg and losartan 100 mg once a day.  Will check renal function panel. -  hydrochlorothiazide (HYDRODIURIL) 25 MG tablet; Take 1 tablet (25 mg total) by mouth daily.  Dispense: 30 tablet; Refill: 0 - losartan (COZAAR) 100 MG tablet; Take 1 tablet (100 mg total) by mouth daily.  Dispense: 30 tablet; Refill: 0 - Renal Function Panel  4. Diabetes mellitus, new onset ([place]) Chronic.  Controlled.  Stable.  Continue Metformin 500 mg twice a day.  Will check renal function panel and A1c. - metFORMIN (GLUCOPHAGE) 500 MG tablet; Take 1 tablet (500 mg total) by mouth 2 (two) times daily with a meal.  Dispense: 180  tablet; Refill: 1 - Renal Function Panel - Hemoglobin A1c  5. Hx of colonic polyps Patient with history of colonic polyps from outside gastroenterologist was done in 2015.  Patient does not relate any current abdominal pain constipation and one episode of diarrhea that had some concern to her.  There is been no hematochezia or melena circumstances.  I am uncertain as to what was decided at a GI visit with Dr. Allen Norris but patient would like to have a second opinion concerning the need for a colonoscopy.  We will refer to Dr. Jacqulyn Liner for evaluation in the meantime we will obtain a CBC and will obtain Hemoccults x3. - Ambulatory referral to Gastroenterology - CBC with Differential/Platelet  6. Taking medication for chronic disease Patient is taken statin for control of current lipid concern.  Will obtain hepatic function to determine hepatotoxicity possibilities. - Hepatic Function Panel (6)

## 2019-11-04 LAB — RENAL FUNCTION PANEL
Albumin: 4.5 g/dL (ref 3.7–4.7)
BUN/Creatinine Ratio: 16 (ref 12–28)
BUN: 13 mg/dL (ref 8–27)
CO2: 28 mmol/L (ref 20–29)
Calcium: 10.7 mg/dL — ABNORMAL HIGH (ref 8.7–10.3)
Chloride: 99 mmol/L (ref 96–106)
Creatinine, Ser: 0.82 mg/dL (ref 0.57–1.00)
GFR calc Af Amer: 79 mL/min/1.73 (ref >59)
GFR calc non Af Amer: 69 mL/min/1.73 (ref >59)
Glucose: 170 mg/dL — ABNORMAL HIGH (ref 65–99)
Phosphorus: 3.3 mg/dL (ref 3.0–4.3)
Potassium: 3.9 mmol/L (ref 3.5–5.2)
Sodium: 141 mmol/L (ref 134–144)

## 2019-11-04 LAB — CBC WITH DIFFERENTIAL/PLATELET
Basophils Absolute: 0 10*3/uL (ref 0.0–0.2)
Basos: 0 %
EOS (ABSOLUTE): 0 10*3/uL (ref 0.0–0.4)
Eos: 1 %
Hematocrit: 48.6 % — ABNORMAL HIGH (ref 34.0–46.6)
Hemoglobin: 15.8 g/dL (ref 11.1–15.9)
Immature Grans (Abs): 0 10*3/uL (ref 0.0–0.1)
Immature Granulocytes: 0 %
Lymphocytes Absolute: 1.5 10*3/uL (ref 0.7–3.1)
Lymphs: 24 %
MCH: 30.6 pg (ref 26.6–33.0)
MCHC: 32.5 g/dL (ref 31.5–35.7)
MCV: 94 fL (ref 79–97)
Monocytes Absolute: 0.4 10*3/uL (ref 0.1–0.9)
Monocytes: 7 %
Neutrophils Absolute: 4.2 10*3/uL (ref 1.4–7.0)
Neutrophils: 68 %
Platelets: 306 10*3/uL (ref 150–450)
RBC: 5.16 x10E6/uL (ref 3.77–5.28)
RDW: 12.5 % (ref 11.7–15.4)
WBC: 6.2 10*3/uL (ref 3.4–10.8)

## 2019-11-04 LAB — HEMOGLOBIN A1C
Est. average glucose Bld gHb Est-mCnc: 123 mg/dL
Hgb A1c MFr Bld: 5.9 % — ABNORMAL HIGH (ref 4.8–5.6)

## 2019-11-04 LAB — LIPID PANEL WITH LDL/HDL RATIO
Cholesterol, Total: 146 mg/dL (ref 100–199)
HDL: 50 mg/dL (ref >39)
LDL Chol Calc (NIH): 69 mg/dL (ref 0–99)
LDL/HDL Ratio: 1.4 ratio (ref 0.0–3.2)
Triglycerides: 158 mg/dL — ABNORMAL HIGH (ref 0–149)
VLDL Cholesterol Cal: 27 mg/dL (ref 5–40)

## 2019-11-04 LAB — HEPATIC FUNCTION PANEL (6)
ALT: 13 IU/L (ref 0–32)
AST: 16 IU/L (ref 0–40)
Alkaline Phosphatase: 67 IU/L (ref 48–121)
Bilirubin Total: 1.8 mg/dL — ABNORMAL HIGH (ref 0.0–1.2)
Bilirubin, Direct: 0.36 mg/dL (ref 0.00–0.40)

## 2019-11-10 ENCOUNTER — Other Ambulatory Visit: Payer: Self-pay

## 2019-11-10 ENCOUNTER — Telehealth: Payer: Self-pay | Admitting: Family Medicine

## 2019-11-10 ENCOUNTER — Other Ambulatory Visit (INDEPENDENT_AMBULATORY_CARE_PROVIDER_SITE_OTHER): Payer: Medicare Other

## 2019-11-10 DIAGNOSIS — Z86010 Personal history of colonic polyps: Secondary | ICD-10-CM

## 2019-11-10 LAB — HEMOCCULT GUIAC POC 1CARD (OFFICE)
Card #2 Fecal Occult Blod, POC: NEGATIVE
Card #3 Fecal Occult Blood, POC: NEGATIVE
Fecal Occult Blood, POC: POSITIVE — AB

## 2019-11-10 NOTE — Telephone Encounter (Signed)
Patient returning call to [place]. She states she has appointment with Dr. Jacqulyn Liner on 08/04.

## 2019-11-10 NOTE — Progress Notes (Signed)
1 out of 3 positive hemos

## 2019-11-10 NOTE — Telephone Encounter (Signed)
noted 

## 2019-11-10 NOTE — Telephone Encounter (Signed)
Copied from [place] [phone]. Topic: General - Other >> Nov 10, 2019  1:08 PM Hinda Lenis D wrote: Reason for CRM: PT needs a referral for a new Gastro DR, she likes Dr Jacqulyn Liner / please advise

## 2019-11-24 NOTE — Telephone Encounter (Signed)
Pt states she has 8/04 appt with Dr Jacqulyn Liner, GI doctor.

## 2019-11-24 NOTE — Telephone Encounter (Signed)
noted 

## 2019-12-22 NOTE — Telephone Encounter (Signed)
Patient is calling to check the status of a report that was supposed to be sent to her GI.  Patient states that it should have been sent to the [facility]  Two months.  Please call patient asap.  CB# [phone]

## 2019-12-22 NOTE — Telephone Encounter (Signed)
Called and spoke with patient. Faxed over her POCT Hemoccult card result to Dr Ricky Stabs office. Confirmed pt with appt date and time. [facility] for 12/30/2019 at 930 AM.  CM

## 2019-12-23 ENCOUNTER — Other Ambulatory Visit: Payer: Self-pay

## 2019-12-23 ENCOUNTER — Telehealth: Payer: Self-pay | Admitting: Family Medicine

## 2019-12-23 DIAGNOSIS — Z86010 Personal history of colonic polyps: Secondary | ICD-10-CM

## 2019-12-23 NOTE — Telephone Encounter (Signed)
Copied from [place] [id number]. Topic: Referral - Request for Referral >> Dec 23, 2019  9:25 AM Angela Pope wrote: Has patient seen PCP for this complaint? Yes.   *If NO, is insurance requiring patient see PCP for this issue before PCP can refer them? Referral for which specialty: GI  Preferred provider/office: Dr Allen Norris /  Lucilla Lame, MD Gastroenterologist in [place], [facility] Address: [address], [place], [place] [address] Phone: [phone] Angela Pope  Reason for referral: Blood in the stool /  she would like this referral sent asap.

## 2019-12-23 NOTE — Telephone Encounter (Signed)
Put in referral to Dr Allen Norris GI

## 2019-12-23 NOTE — Telephone Encounter (Signed)
Pt is established with [facility]- she needs to call there for follow up [phone]- please call her

## 2019-12-23 NOTE — Progress Notes (Unsigned)
Put in referral for Dr Allen Norris GI

## 2019-12-31 ENCOUNTER — Telehealth: Payer: Medicare Other

## 2019-12-31 ENCOUNTER — Telehealth: Payer: Medicare Other | Admitting: Gastroenterology

## 2020-01-07 ENCOUNTER — Ambulatory Visit (INDEPENDENT_AMBULATORY_CARE_PROVIDER_SITE_OTHER): Payer: Medicare Other

## 2020-01-07 DIAGNOSIS — Z0000 Encounter for general adult medical examination without abnormal findings: Secondary | ICD-10-CM

## 2020-01-07 NOTE — Patient Instructions (Signed)
Angela Pope , Thank you for taking time to come for your Medicare Wellness Visit. I appreciate your ongoing commitment to your health goals. Please review the following plan we discussed and let me know if I can assist you in the future.   Screening recommendations/referrals: Colonoscopy: done 07/31/13 Mammogram: done 04/14/19 Bone Density: done 04/14/19 Recommended yearly ophthalmology/optometry visit for glaucoma screening and checkup Recommended yearly dental visit for hygiene and checkup  Vaccinations: Influenza vaccine: done 02/05/19 Pneumococcal vaccine: done 2012 Tdap vaccine: done 2012 Shingles vaccine: Shingrix discussed. Please contact your pharmacy for coverage information.  Covid-19:done 06/29/19 & 07/21/19   Advanced directives: Please bring a copy of your health care power of attorney and living will to the office at your convenience.  Conditions/risks identified: Recommend increasing physical activity   Next appointment: Follow up in one year for your annual wellness visit    Preventive Care 65 Years and Older, Female Preventive care refers to lifestyle choices and visits with your health care provider that can promote health and wellness. What does preventive care include?  A yearly physical exam. This is also called an annual well check.  Dental exams once or twice a year.  Routine eye exams. Ask your health care provider how often you should have your eyes checked.  Personal lifestyle choices, including:  Daily care of your teeth and gums.  Regular physical activity.  Eating a healthy diet.  Avoiding tobacco and drug use.  Limiting alcohol use.  Practicing safe sex.  Taking low-dose aspirin every day.  Taking vitamin and mineral supplements as recommended by your health care provider. What happens during an annual well check? The services and screenings done by your health care provider during your annual well check will depend on your age, overall  health, lifestyle risk factors, and family history of disease. Counseling  Your health care provider may ask you questions about your:  Alcohol use.  Tobacco use.  Drug use.  Emotional well-being.  Home and relationship well-being.  Sexual activity.  Eating habits.  History of falls.  Memory and ability to understand (cognition).  Work and work Statistician.  Reproductive health. Screening  You may have the following tests or measurements:  Height, weight, and BMI.  Blood pressure.  Lipid and cholesterol levels. These may be checked every 5 years, or more frequently if you are over 63 years old.  Skin check.  Lung cancer screening. You may have this screening every year starting at age 45 if you have a 30-pack-year history of smoking and currently smoke or have quit within the past 15 years.  Fecal occult blood test (FOBT) of the stool. You may have this test every year starting at age 72.  Flexible sigmoidoscopy or colonoscopy. You may have a sigmoidoscopy every 5 years or a colonoscopy every 10 years starting at age 68.  Hepatitis C blood test.  Hepatitis B blood test.  Sexually transmitted disease (STD) testing.  Diabetes screening. This is done by checking your blood sugar (glucose) after you have not eaten for a while (fasting). You may have this done every 1-3 years.  Bone density scan. This is done to screen for osteoporosis. You may have this done starting at age 10.  Mammogram. This may be done every 1-2 years. Talk to your health care provider about how often you should have regular mammograms. Talk with your health care provider about your test results, treatment options, and if necessary, the need for more tests. Vaccines  Your health  care provider may recommend certain vaccines, such as:  Influenza vaccine. This is recommended every year.  Tetanus, diphtheria, and acellular pertussis (Tdap, Td) vaccine. You may need a Td booster every 10  years.  Zoster vaccine. You may need this after age 64.  Pneumococcal 13-valent conjugate (PCV13) vaccine. One dose is recommended after age 46.  Pneumococcal polysaccharide (PPSV23) vaccine. One dose is recommended after age 76. Talk to your health care provider about which screenings and vaccines you need and how often you need them. This information is not intended to replace advice given to you by your health care provider. Make sure you discuss any questions you have with your health care provider. Document Released: 06/11/2015 Document Revised: 02/02/2016 Document Reviewed: 03/16/2015 Elsevier Interactive Patient Education  2017 [place] Prevention in the Home Falls can cause injuries. They can happen to people of all ages. There are many things you can do to make your home safe and to help prevent falls. What can I do on the outside of my home?  Regularly fix the edges of walkways and driveways and fix any cracks.  Remove anything that might make you trip as you walk through a door, such as a raised step or threshold.  Trim any bushes or trees on the path to your home.  Use bright outdoor lighting.  Clear any walking paths of anything that might make someone trip, such as rocks or tools.  Regularly check to see if handrails are loose or broken. Make sure that both sides of any steps have handrails.  Any raised decks and porches should have guardrails on the edges.  Have any leaves, snow, or ice cleared regularly.  Use sand or salt on walking paths during winter.  Clean up any spills in your garage right away. This includes oil or grease spills. What can I do in the bathroom?  Use night lights.  Install grab bars by the toilet and in the tub and shower. Do not use towel bars as grab bars.  Use non-skid mats or decals in the tub or shower.  If you need to sit down in the shower, use a plastic, non-slip stool.  Keep the floor dry. Clean up any water that  spills on the floor as soon as it happens.  Remove soap buildup in the tub or shower regularly.  Attach bath mats securely with double-sided non-slip rug tape.  Do not have throw rugs and other things on the floor that can make you trip. What can I do in the bedroom?  Use night lights.  Make sure that you have a light by your bed that is easy to reach.  Do not use any sheets or blankets that are too big for your bed. They should not hang down onto the floor.  Have a firm chair that has side arms. You can use this for support while you get dressed.  Do not have throw rugs and other things on the floor that can make you trip. What can I do in the kitchen?  Clean up any spills right away.  Avoid walking on wet floors.  Keep items that you use a lot in easy-to-reach places.  If you need to reach something above you, use a strong step stool that has a grab bar.  Keep electrical cords out of the way.  Do not use floor polish or wax that makes floors slippery. If you must use wax, use non-skid floor wax.  Do not have  throw rugs and other things on the floor that can make you trip. What can I do with my stairs?  Do not leave any items on the stairs.  Make sure that there are handrails on both sides of the stairs and use them. Fix handrails that are broken or loose. Make sure that handrails are as long as the stairways.  Check any carpeting to make sure that it is firmly attached to the stairs. Fix any carpet that is loose or worn.  Avoid having throw rugs at the top or bottom of the stairs. If you do have throw rugs, attach them to the floor with carpet tape.  Make sure that you have a light switch at the top of the stairs and the bottom of the stairs. If you do not have them, ask someone to add them for you. What else can I do to help prevent falls?  Wear shoes that:  Do not have high heels.  Have rubber bottoms.  Are comfortable and fit you well.  Are closed at the  toe. Do not wear sandals.  If you use a stepladder:  Make sure that it is fully opened. Do not climb a closed stepladder.  Make sure that both sides of the stepladder are locked into place.  Ask someone to hold it for you, if possible.  Clearly mark and make sure that you can see:  Any grab bars or handrails.  First and last steps.  Where the edge of each step is.  Use tools that help you move around (mobility aids) if they are needed. These include:  Canes.  Walkers.  Scooters.  Crutches.  Turn on the lights when you go into a dark area. Replace any light bulbs as soon as they burn out.  Set up your furniture so you have a clear path. Avoid moving your furniture around.  If any of your floors are uneven, fix them.  If there are any pets around you, be aware of where they are.  Review your medicines with your doctor. Some medicines can make you feel dizzy. This can increase your chance of falling. Ask your doctor what other things that you can do to help prevent falls. This information is not intended to replace advice given to you by your health care provider. Make sure you discuss any questions you have with your health care provider. Document Released: 03/11/2009 Document Revised: 10/21/2015 Document Reviewed: 06/19/2014 Elsevier Interactive Patient Education  2017 [employer].

## 2020-01-07 NOTE — Progress Notes (Signed)
Subjective:   Angela Pope is a 79 y.o. female who presents for Medicare Annual (Subsequent) preventive examination.  Virtual Visit via Telephone Note  I connected with  Charisma Guiffre on 01/07/20 at  8:00 AM EDT by telephone and verified that I am speaking with the correct person using two identifiers.  Medicare Annual Wellness visit completed telephonically due to Covid-19 pandemic.   Location: Patient: home Provider: [facility]   I discussed the limitations, risks, security and privacy concerns of performing an evaluation and management service by telephone and the availability of in person appointments. The patient expressed understanding and agreed to proceed.  Unable to perform video visit due to video visit attempted and failed and/or patient does not have video capability.   Some vital signs may be absent or patient reported.   Clemetine Marker, LPN    Review of Systems     Cardiac Risk Factors include: advanced age (>80men, >67 women);diabetes mellitus;dyslipidemia;hypertension     Objective:    There were no vitals filed for this visit. There is no height or weight on file to calculate BMI.  Advanced Directives 01/07/2020 01/06/2019 10/31/2017 03/15/2017 10/30/2016 01/13/2015  Does Patient Have a Medical Advance Directive? Yes Yes Yes No Yes Yes  Type of Paramedic of [place];Living will [place];Living will [place];Living will - [place];Living will Living will  Copy of [place] in Chart? No - copy requested No - copy requested No - copy requested - No - copy requested -    Current Medications (verified) Outpatient Encounter Medications as of 01/07/2020  Medication Sig  . Calcium Carb-Cholecalciferol (CALCIUM 1000 + D) 1000-800 MG-UNIT TABS Take 1 tablet by mouth daily.  Marland Kitchen glucose blood test strip Use to check Blood Sugar 3 times daily for ICD10: E11.9(One touch ultra blue  brand. )  . hydrochlorothiazide (HYDRODIURIL) 25 MG tablet Take 1 tablet (25 mg total) by mouth daily.  . Lancets (ONETOUCH ULTRASOFT) lancets Use to check Blood Sugar 3 times daily for ICD 10:  E11.9  (Touch Twist Lancets for Ultra Touch)  . loperamide (IMODIUM) 2 MG capsule Take 2 mg by mouth as needed for diarrhea or loose stools.  Marland Kitchen losartan (COZAAR) 100 MG tablet Take 1 tablet (100 mg total) by mouth daily.  . metFORMIN (GLUCOPHAGE) 500 MG tablet Take 1 tablet (500 mg total) by mouth 2 (two) times daily with a meal.  . Multiple Vitamin (MULTIVITAMIN) capsule Take 1 capsule by mouth daily.  . simvastatin (ZOCOR) 40 MG tablet Take 1 tablet (40 mg total) by mouth daily.  . vitamin E (VITAMIN E) 180 MG (400 UNITS) capsule Take 400 Units by mouth daily.  Marland Kitchen triamcinolone (NASACORT AQ) 55 MCG/ACT AERO nasal inhaler Place 2 sprays into the nose daily. (Patient not taking: Reported on 11/03/2019)  . [DISCONTINUED] glucosamine-chondroitin (MAX GLUCOSAMINE CHONDROITIN) 500-400 MG tablet Take 1 tablet by mouth 2 (two) times daily. (Patient not taking: Reported on 11/03/2019)   No facility-administered encounter medications on file as of 01/07/2020.    Allergies (verified) Clarithromycin, Sulfa antibiotics, and Tetracyclines & related   History: Past Medical History:  Diagnosis Date  . Diabetes mellitus without complication ([place])    controls with diet  . Hyperlipidemia   . Hypertension    Past Surgical History:  Procedure Laterality Date  . COLONOSCOPY  2015  . large intestine surgery    . SMALL INTESTINE SURGERY  2012   polyp that  could't be removed   Family History  Problem Relation Age of Onset  . Heart disease Mother   . Hypertension Mother   . Atrial fibrillation Mother   . Diabetes Brother   . Heart disease Brother   . Hypertension Brother   . Diabetes Paternal Grandfather   . Stroke Father   . Prostate cancer Brother   . Hypertension Brother   . Healthy Daughter   . Diabetes  Son   . Breast cancer Neg Hx    Social History   Socioeconomic History  . Marital status: Divorced    Spouse name: Not on file  . Number of children: 2  . Years of education: [employer]  . Highest education level: 12th grade  Occupational History  . Occupation: Retired  Tobacco Use  . Smoking status: Former Smoker    Packs/day: 0.25    Years: 10.00    Pack years: 2.50    Types: Cigarettes    Quit date: 10/18/1965    Years since quitting: 54.2  . Smokeless tobacco: Never Used  . Tobacco comment: smoking cessation materials not required  Vaping Use  . Vaping Use: Never used  Substance and Sexual Activity  . Alcohol use: Not Currently    Alcohol/week: 0.0 standard drinks  . Drug use: No  . Sexual activity: Never  Other Topics Concern  . Not on file  Social History Narrative  . Not on file   Social Determinants of Health   Financial Resource Strain: Low Risk   . Difficulty of Paying Living Expenses: Not hard at all  Food Insecurity: No Food Insecurity  . Worried About Charity fundraiser in the Last Year: Never true  . Ran Out of Food in the Last Year: Never true  Transportation Needs: No Transportation Needs  . Lack of Transportation (Medical): No  . Lack of Transportation (Non-Medical): No  Physical Activity: Inactive  . Days of Exercise per Week: 0 days  . Minutes of Exercise per Session: 0 min  Stress: Stress Concern Present  . Feeling of Stress : To some extent  Social Connections: Unknown  . Frequency of Communication with Friends and Family: Not on file  . Frequency of Social Gatherings with Friends and Family: Not on file  . Attends Religious Services: Not on file  . Active Member of Clubs or Organizations: Not on file  . Attends Archivist Meetings: Not on file  . Marital Status: Divorced    Tobacco Counseling Counseling given: Not Answered Comment: smoking cessation materials not required   Clinical Intake:  Pre-visit preparation  completed: Yes  Pain : No/denies pain     Nutritional Risks: None Diabetes: Yes CBG done?: No Did pt. bring in CBG monitor from home?: No  How often do you need to have someone help you when you read instructions, pamphlets, or other written materials from your doctor or pharmacy?: 1 - Never  Nutrition Risk Assessment:  Has the patient had any N/V/D within the last 2 months?  No  Does the patient have any non-healing wounds?  No  Has the patient had any unintentional weight loss or weight gain?  No   Diabetes:  Is the patient diabetic?  Yes  If diabetic, was a CBG obtained today?  No  Did the patient bring in their glucometer from home?  No  How often do you monitor your CBG's? As needed.   Financial Strains and Diabetes Management:  Are you having any financial strains  with the device, your supplies or your medication? No .  Does the patient want to be seen by Chronic Care Management for management of their diabetes?  No  Would the patient like to be referred to a Nutritionist or for Diabetic Management?  No   Diabetic Exams:  Diabetic Eye Exam: Completed 05/16/19.   Diabetic Foot Exam: Completed 03/18/19.  Interpreter Needed?: No  Information entered by :: Clemetine Marker LPN   Activities of Daily Living In your present state of health, do you have any difficulty performing the following activities: 01/07/2020 03/18/2019  Hearing? N N  Comment declines hearing aids -  Vision? N N  Difficulty concentrating or making decisions? N N  Walking or climbing stairs? N N  Dressing or bathing? N N  Doing errands, shopping? N N  Preparing Food and eating ? N -  Using the Toilet? N -  In the past six months, have you accidently leaked urine? Y -  Comment wears pads for protection -  Do you have problems with loss of bowel control? N -  Managing your Medications? N -  Managing your Finances? N -  Housekeeping or managing your Housekeeping? N -  Some recent data might be  hidden    Patient Care Team: Juline Patch, MD as PCP - General (Family Medicine)  Indicate any recent Medical Services you may have received from other than [facility] providers in the past year (date may be approximate).     Assessment:   This is a routine wellness examination for Daianna.  Hearing/Vision screen  Hearing Screening   125Hz  250Hz  500Hz  1000Hz  2000Hz  3000Hz  4000Hz  6000Hz  8000Hz   Right ear:           Left ear:           Comments: Pt denies hearing difficulty  Vision Screening Comments: Annual vision screenings at [facility]  Dietary issues and exercise activities discussed: Current Exercise Habits: The patient does not participate in regular exercise at present, Exercise limited by: None identified  Goals    . DIET - INCREASE WATER INTAKE     Recommend to drink at least 6-8 8oz glasses of water per day.    . Have 3 meals a day     Recommend eating 3 health meals a day.      Depression Screen PHQ 2/9 Scores 01/07/2020 11/03/2019 03/18/2019 02/05/2019 01/06/2019 07/29/2018 10/31/2017  PHQ - 2 Score 1 1 2 2 2 1  0  PHQ- 9 Score - 1 7 4 4 6  0    Fall Risk Fall Risk  01/07/2020 11/03/2019 03/18/2019 01/06/2019 07/29/2018  Falls in the past year? 0 0 0 0 0  Comment - - - - -  Number falls in past yr: 0 - 0 0 0  Comment - - - - -  Injury with Fall? 0 - 0 0 0  Risk for fall due to : No Fall Risks - - - -  Risk for fall due to: Comment - - - - -  Follow up Falls prevention discussed Falls evaluation completed Falls evaluation completed Falls prevention discussed -    Any stairs in or around the home? No  If so, are there any without handrails? No  Home free of loose throw rugs in walkways, pet beds, electrical cords, etc? Yes  Adequate lighting in your home to reduce risk of falls? Yes   ASSISTIVE DEVICES UTILIZED TO PREVENT FALLS:  Life alert? No  Use of a  cane, walker or w/c? No  Grab bars in the bathroom? Yes  Shower chair or bench in shower? Yes  Elevated  toilet seat or a handicapped toilet? Yes   TIMED UP AND GO:  Was the test performed? No . Telephonic visit  Cognitive Function:     6CIT Screen 01/07/2020 01/06/2019 10/31/2017 10/30/2016  What Year? 0 points 0 points 4 points 0 points  What month? 0 points 0 points 0 points 0 points  What time? 0 points 0 points 0 points 0 points  Count back from 20 0 points 0 points 0 points 0 points  Months in reverse 0 points 0 points 0 points 0 points  Repeat phrase 0 points 0 points 0 points 2 points  Total Score 0 0 4 2    Immunizations Immunization History  Administered Date(s) Administered  . Fluad Quad(high Dose 65+) 02/05/2019  . Influenza, High Dose Seasonal PF 03/18/2018  . Influenza,inj,Quad PF,6+ Mos 03/10/2015, 03/21/2016, 03/15/2017  . PFIZER SARS-COV-2 Vaccination 06/29/2019, 07/21/2019  . Pneumococcal Conjugate-13 05/29/2009  . Pneumococcal Polysaccharide-23 05/29/2010  . Td 05/29/2010    TDAP status: Up to date   Flu Vaccine status: Up to date   Pneumococcal vaccine status: Up to date   Covid-19 vaccine status: Completed vaccines  Qualifies for Shingles Vaccine? Yes   Zostavax completed No   Shingrix Completed?: No.    Education has been provided regarding the importance of this vaccine. Patient has been advised to call insurance company to determine out of pocket expense if they have not yet received this vaccine. Advised may also receive vaccine at local pharmacy or Health Dept. Verbalized acceptance and understanding.  Screening Tests Health Maintenance  Topic Date Due  . Hepatitis C Screening  Never done  . INFLUENZA VACCINE  12/28/2019  . FOOT EXAM  03/17/2020  . MAMMOGRAM  04/13/2020  . HEMOGLOBIN A1C  05/04/2020  . OPHTHALMOLOGY EXAM  05/15/2020  . TETANUS/TDAP  05/29/2020  . DEXA SCAN  Completed  . COVID-19 Vaccine  Completed  . PNA vac Low Risk Adult  Completed    Health Maintenance  Health Maintenance Due  Topic Date Due  . Hepatitis C Screening   Never done  . INFLUENZA VACCINE  12/28/2019    Colorectal screening status: completed 07/31/13. Pt has upcoming appt with Dr. Allen Norris to discuss need for repeat screening colonscopy.   Mammogram status: Completed 04/14/19. Repeat every year   Bone Density status: Completed 04/14/19. Results reflect: Bone density results: OSTEOPENIA. Repeat every 2 years.  Lung Cancer Screening: (Low Dose CT Chest recommended if Age 66-80 years, 30 pack-year currently smoking OR have quit w/in 15years.) does not qualify.    Additional Screening:  Hepatitis C Screening: does qualify; postponed  Vision Screening: Recommended annual ophthalmology exams for early detection of glaucoma and other disorders of the eye. Is the patient up to date with their annual eye exam?  Yes  Who is the provider or what is the name of the office in which the patient attends annual eye exams? [place] Screening: Recommended annual dental exams for proper oral hygiene  Community Resource Referral / Chronic Care Management: CRR required this visit?  No   CCM required this visit?  No      Plan:     I have personally reviewed and noted the following in the patient's chart:   . Medical and social history . Use of alcohol, tobacco or illicit drugs  . Current medications  and supplements . Functional ability and status . Nutritional status . Physical activity . Advanced directives . List of other physicians . Hospitalizations, surgeries, and ER visits in previous 12 months . Vitals . Screenings to include cognitive, depression, and falls . Referrals and appointments  In addition, I have reviewed and discussed with patient certain preventive protocols, quality metrics, and best practice recommendations. A written personalized care plan for preventive services as well as general preventive health recommendations were provided to patient.     Clemetine Marker, LPN   [id number]/[id number]   Nurse Notes: pt has appt  tomorrow with Dr. Ronnald Ramp and her daughter plans to attend to discuss abnormal gait and memory; pt states she feels like she is doing fine and her daughter is being over protective. Pt reports no use of assistive devices for ambulation but does have canes at home if needed that were her mother's. She also has no concerns about her memory other than normal aging. Pt appreciative of visit today.

## 2020-01-08 ENCOUNTER — Other Ambulatory Visit: Payer: Self-pay

## 2020-01-08 ENCOUNTER — Encounter: Payer: Self-pay | Admitting: Family Medicine

## 2020-01-08 ENCOUNTER — Ambulatory Visit (INDEPENDENT_AMBULATORY_CARE_PROVIDER_SITE_OTHER): Payer: Medicare Other | Admitting: Family Medicine

## 2020-01-08 VITALS — BP 110/64 | HR 68 | Ht 65.0 in | Wt 148.0 lb

## 2020-01-08 DIAGNOSIS — R413 Other amnesia: Secondary | ICD-10-CM | POA: Diagnosis not present

## 2020-01-08 DIAGNOSIS — I10 Essential (primary) hypertension: Secondary | ICD-10-CM

## 2020-01-08 DIAGNOSIS — R270 Ataxia, unspecified: Secondary | ICD-10-CM

## 2020-01-08 MED ORDER — HYDROCHLOROTHIAZIDE 25 MG PO TABS: 25.0000 mg | ORAL_TABLET | Freq: Every day | ORAL | 1 refills | Status: DC

## 2020-01-08 MED ORDER — LOSARTAN POTASSIUM 100 MG PO TABS: 100.0000 mg | ORAL_TABLET | Freq: Every day | ORAL | 1 refills | Status: DC

## 2020-01-08 NOTE — Progress Notes (Signed)
Date:  01/08/2020   Name:  Angela Pope   DOB:  March 18, 1941   MRN:  [id number]   Chief Complaint: Memory Loss (per daughter)  Hypertension This is a chronic problem. The current episode started more than 1 year ago. The problem has been gradually improving since onset. The problem is controlled. Pertinent negatives include no anxiety, blurred vision, chest pain, headaches, malaise/fatigue, neck pain, orthopnea, palpitations, peripheral edema, PND, shortness of breath or sweats. There are no associated agents to hypertension. There are no known risk factors for coronary artery disease. Past treatments include diuretics and angiotensin blockers. The current treatment provides moderate improvement. There are no compliance problems.  There is no history of angina, kidney disease, CAD/MI, CVA, heart failure, left ventricular hypertrophy, PVD or retinopathy. There is no history of chronic renal disease, a hypertension causing med or renovascular disease.  Neurologic Problem The patient's pertinent negatives include no altered mental status, clumsiness, focal sensory loss, focal weakness, loss of balance, memory loss, near-syncope, slurred speech, syncope, visual change or weakness. Primary symptoms comment: gait concern/clumsiness. This is a new problem. The current episode started more than 1 month ago. The neurological problem developed gradually. The problem is unchanged. There was no focality noted. Pertinent negatives include no abdominal pain, auditory change, back pain, chest pain, confusion, diaphoresis, dizziness, fatigue, fever, headaches, light-headedness, nausea, neck pain, palpitations, shortness of breath or vomiting. The treatment provided mild relief.    Lab Results  Component Value Date   CREATININE 0.82 11/03/2019   BUN 13 11/03/2019   NA 141 11/03/2019   K 3.9 11/03/2019   CL 99 11/03/2019   CO2 28 11/03/2019   Lab Results  Component Value Date   CHOL 146 11/03/2019   HDL  50 11/03/2019   LDLCALC 69 11/03/2019   TRIG 158 (H) 11/03/2019   CHOLHDL 3.4 03/27/2018   Lab Results  Component Value Date   TSH 1.260 03/27/2017   Lab Results  Component Value Date   HGBA1C 5.9 (H) 11/03/2019   Lab Results  Component Value Date   WBC 6.2 11/03/2019   HGB 15.8 11/03/2019   HCT 48.6 (H) 11/03/2019   MCV 94 11/03/2019   PLT 306 11/03/2019   Lab Results  Component Value Date   ALT 13 11/03/2019   AST 16 11/03/2019   ALKPHOS 67 11/03/2019   BILITOT 1.8 (H) 11/03/2019     Review of Systems  Constitutional: Negative.  Negative for chills, diaphoresis, fatigue, fever, malaise/fatigue and unexpected weight change.  HENT: Negative for congestion, ear discharge, ear pain, rhinorrhea, sinus pressure, sneezing and sore throat.   Eyes: Negative for blurred vision, photophobia, pain, discharge, redness and itching.  Respiratory: Negative for cough, shortness of breath, wheezing and stridor.   Cardiovascular: Negative for chest pain, palpitations, orthopnea, PND and near-syncope.  Gastrointestinal: Negative for abdominal pain, blood in stool, constipation, diarrhea, nausea and vomiting.  Endocrine: Negative for cold intolerance, heat intolerance, polydipsia, polyphagia and polyuria.  Genitourinary: Negative for dysuria, flank pain, frequency, hematuria, menstrual problem, pelvic pain, urgency, vaginal bleeding and vaginal discharge.  Musculoskeletal: Negative for arthralgias, back pain, myalgias and neck pain.  Skin: Negative for rash.  Allergic/Immunologic: Negative for environmental allergies and food allergies.  Neurological: Negative for dizziness, focal weakness, syncope, weakness, light-headedness, numbness, headaches and loss of balance.  Hematological: Negative for adenopathy. Does not bruise/bleed easily.  Psychiatric/Behavioral: Negative for confusion, dysphoric mood and memory loss. The patient is not nervous/anxious.     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   Bilateral lower extremity edema 01/14/2015   HTN (hypertension) 01/13/2015   Diabetes mellitus type 2, controlled, without complications ([place]) 72/53/6644   Hyperlipidemia 01/13/2015   Overweight (BMI 25.0-29.9) 01/13/2015   Allergic rhinitis 01/13/2015   Hx of colonic polyps 01/13/2015   Hx of resection of large bowel 01/13/2015   Chronic right hip pain 01/13/2015   Vitamin D deficiency 01/13/2015   FH: heart disease 01/13/2015   FH: stroke 01/13/2015   Incomplete emptying of bladder 07/02/2009   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    Allergies  Allergen Reactions   Clarithromycin    Sulfa Antibiotics    Tetracyclines & Related     Past Surgical History:  Procedure Laterality Date   COLONOSCOPY  2015   large intestine surgery     SMALL INTESTINE SURGERY  2012   polyp that could't be removed    Social History   Tobacco Use   Smoking status: Former Smoker    Packs/day: 0.25    Years: 10.00    Pack years: 2.50    Types: Cigarettes    Quit date: 10/18/1965    Years since quitting: 54.2   Smokeless tobacco: Never Used   Tobacco comment: smoking cessation materials not required  Vaping Use   Vaping Use: Never used  Substance Use Topics   Alcohol use: Not Currently    Alcohol/week: 0.0 standard drinks   Drug use: No     Medication list has been reviewed and updated.  Current Meds  Medication Sig   Calcium Carb-Cholecalciferol (CALCIUM 1000 + D) 1000-800 MG-UNIT TABS Take 1 tablet by mouth daily.   glucose blood test strip Use to check Blood Sugar 3 times daily for ICD10: E11.9(One touch ultra blue brand. )   hydrochlorothiazide (HYDRODIURIL) 25 MG tablet Take 1 tablet (25 mg total) by mouth daily.   Lancets (ONETOUCH ULTRASOFT) lancets Use to check Blood Sugar 3 times daily for ICD 10:  E11.9  (Touch Twist Lancets for Ultra  Touch)   loperamide (IMODIUM) 2 MG capsule Take 2 mg by mouth as needed for diarrhea or loose stools.   losartan (COZAAR) 100 MG tablet Take 1 tablet (100 mg total) by mouth daily.   metFORMIN (GLUCOPHAGE) 500 MG tablet Take 1 tablet (500 mg total) by mouth 2 (two) times daily with a meal.   Multiple Vitamin (MULTIVITAMIN) capsule Take 1 capsule by mouth daily.   simvastatin (ZOCOR) 40 MG tablet Take 1 tablet (40 mg total) by mouth daily.   triamcinolone (NASACORT AQ) 55 MCG/ACT AERO nasal inhaler Place 2 sprays into the nose daily.   vitamin E (VITAMIN E) 180 MG (400 UNITS) capsule Take 400 Units by mouth daily.    PHQ 2/9 Scores 01/07/2020 11/03/2019 03/18/2019 02/05/2019  PHQ - 2 Score 1 1 2 2   PHQ- 9 Score - 1 7 4     GAD 7 : Generalized Anxiety Score 11/03/2019  Nervous, Anxious, on Edge 1  Control/stop worrying 0  Worry too much - different things 0  Trouble relaxing 0  Restless 0  Easily annoyed or irritable 0  Afraid - awful might happen 0  Total GAD 7 Score 1  Anxiety Difficulty Not difficult at all    BP Readings from Last 3 Encounters:  01/08/20 110/64  11/03/19 100/70  03/18/19 102/77    Physical Exam Vitals and nursing note reviewed.  Constitutional:  Appearance: She is well-developed.  HENT:     Head: Normocephalic.     Right Ear: External ear normal.     Left Ear: External ear normal.  Eyes:     General: Lids are everted, no foreign bodies appreciated. No scleral icterus.       Left eye: No foreign body or hordeolum.     Conjunctiva/sclera: Conjunctivae normal.     Right eye: Right conjunctiva is not injected.     Left eye: Left conjunctiva is not injected.     Pupils: Pupils are equal, round, and reactive to light.  Neck:     Thyroid: No thyromegaly.     Vascular: No JVD.     Trachea: No tracheal deviation.  Cardiovascular:     Rate and Rhythm: Normal rate and regular rhythm.     Heart sounds: Normal heart sounds. No murmur heard.  No friction  rub. No gallop.   Pulmonary:     Effort: Pulmonary effort is normal. No respiratory distress.     Breath sounds: Normal breath sounds. No wheezing or rales.  Abdominal:     General: Bowel sounds are normal.     Palpations: Abdomen is soft. There is no mass.     Tenderness: There is no abdominal tenderness. There is no guarding or rebound.  Musculoskeletal:        General: No tenderness. Normal range of motion.     Cervical back: Normal range of motion and neck supple.  Lymphadenopathy:     Cervical: No cervical adenopathy.  Skin:    General: Skin is warm.     Findings: No rash.  Neurological:     Mental Status: She is alert and oriented to person, place, and time.     Cranial Nerves: No cranial nerve deficit.     Deep Tendon Reflexes: Reflexes normal.  Psychiatric:        Mood and Affect: Mood is not anxious or depressed.     Wt Readings from Last 3 Encounters:  01/08/20 148 lb (67.1 kg)  11/03/19 156 lb (70.8 kg)  03/18/19 180 lb (81.6 kg)    BP 110/64    Pulse 68    Ht 5\' 5"  (1.651 m)    Wt 148 lb (67.1 kg)    BMI 24.63 kg/m   Assessment and Plan: 1. Essential hypertension Chronic.  Controlled.  Stable.  Continue losartan 100 mg daily as well as hydrochlorothiazide 25 mg once a day. - losartan (COZAAR) 100 MG tablet; Take 1 tablet (100 mg total) by mouth daily.  Dispense: 90 tablet; Refill: 1 - hydrochlorothiazide (HYDRODIURIL) 25 MG tablet; Take 1 tablet (25 mg total) by mouth daily.  Dispense: 90 tablet; Refill: 1  2. Ataxia It has been noted by the daughter that patient seems to be having some issues with her gait.  Patient is always had a listing to one side when walking down hallways.  Patient does have difficulty with heel-to-toe but is able to walk with very minimal wide base with her gait.  It appears to be more of an issue with ataxia and will refer to neurology for evaluation. - Ambulatory referral to Neurology  3. Memory change Daughter also noticed that she  seems to be forgetting things like losing her keys.  CIT score was 0 and patient seems to be a good historian as far as her remembrance as well.  Patient does do a lot of reading particular ministries which is a higher level of cognizance.  As noted above we will refer to neurology for more thorough evaluation of memory.  If this is early stage Alzheimer's it would be very very early but acceptable concern given the family member that has noted the symptoms. - Ambulatory referral to Neurology

## 2020-01-15 DIAGNOSIS — G3184 Mild cognitive impairment, so stated: Secondary | ICD-10-CM | POA: Insufficient documentation

## 2020-01-15 DIAGNOSIS — R270 Ataxia, unspecified: Secondary | ICD-10-CM | POA: Insufficient documentation

## 2020-01-15 DIAGNOSIS — R413 Other amnesia: Secondary | ICD-10-CM | POA: Diagnosis not present

## 2020-03-03 ENCOUNTER — Other Ambulatory Visit: Payer: Self-pay

## 2020-03-03 ENCOUNTER — Ambulatory Visit (INDEPENDENT_AMBULATORY_CARE_PROVIDER_SITE_OTHER): Payer: Medicare Other | Admitting: Gastroenterology

## 2020-03-03 ENCOUNTER — Encounter: Payer: Self-pay | Admitting: Gastroenterology

## 2020-03-03 VITALS — BP 118/66 | HR 94 | Ht 65.0 in | Wt 144.4 lb

## 2020-03-03 DIAGNOSIS — K921 Melena: Secondary | ICD-10-CM

## 2020-03-03 MED ORDER — SUPREP BOWEL PREP KIT 17.5-3.13-1.6 GM/177ML PO SOLN: 1.0000 | ORAL | 0 refills | Status: DC

## 2020-03-03 NOTE — Progress Notes (Signed)
Gastroenterology Consultation  Referring Provider:     Juline Patch, MD Primary Care Physician:  Juline Patch, MD Primary Gastroenterologist:  Dr. Allen Norris     Reason for Consultation:     Heme postive        HPI:   Althea Backs is a 79 y.o. y/o female referred for consultation & management of heme positive stools by Dr. Ronnald Ramp, Iven Finn, MD.  This patient comes in with a history of having an advanced adenoma with a right hemicolectomy.  The patient's last colonoscopy was in 2015 at an outside location and was recommended to have a repeat colonoscopy in 3 years. The patient has not had a repeat colonoscopy since then.  The patient also reports that her mother and her grandmother have made it to nearly 33.  There is no report of any unexplained weight loss fevers chills black stools or bloody stools.  She states that the blood in her stool was not visible so far.  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 The patient has been diagnosed with some memory changes and a question for evaluation for dementia/Alzheimer's was discussed by her primary care physician.  Past Medical History:  Diagnosis Date  . Diabetes mellitus without complication ([place])    controls with diet  . Hyperlipidemia   . Hypertension     Past Surgical History:  Procedure Laterality Date  . COLONOSCOPY  2015  . large intestine surgery    . SMALL INTESTINE SURGERY  2012   polyp that could't be removed    Prior to Admission medications   Medication Sig Start Date End Date Taking? Authorizing Provider  Calcium Carb-Cholecalciferol (CALCIUM 1000 + D) 1000-800 MG-UNIT TABS Take 1 tablet by mouth daily.    [provider]  glucose blood test strip Use to check Blood Sugar 3 times daily for ICD10: E11.9(One touch ultra blue brand. ) 08/09/18   Juline Patch, MD  hydrochlorothiazide (HYDRODIURIL) 25 MG tablet Take 1 tablet (25 mg total) by mouth daily.  01/08/20   Juline Patch, MD  Lancets [facility] ULTRASOFT) lancets Use to check Blood Sugar 3 times daily for ICD 10:  E11.9  (Touch Twist Lancets for Ultra Touch) 08/09/18   Juline Patch, MD  loperamide (IMODIUM) 2 MG capsule Take 2 mg by mouth as needed for diarrhea or loose stools.    [provider]  losartan (COZAAR) 100 MG tablet Take 1 tablet (100 mg total) by mouth daily. 01/08/20   Juline Patch, MD  metFORMIN (GLUCOPHAGE) 500 MG tablet Take 1 tablet (500 mg total) by mouth 2 (two) times daily with a meal. 11/03/19   Juline Patch, MD  Multiple Vitamin (MULTIVITAMIN) capsule Take 1 capsule by mouth daily. 01/13/15   Plonk, Gwyndolyn Saxon, MD  simvastatin (ZOCOR) 40 MG tablet Take 1 tablet (40 mg total) by mouth daily. 11/03/19   Juline Patch, MD  triamcinolone (NASACORT AQ) 55 MCG/ACT AERO nasal inhaler Place 2 sprays into the nose daily. 01/13/15   Plonk, Gwyndolyn Saxon, MD  vitamin E (VITAMIN E) 180 MG (400 UNITS) capsule Take 400 Units by mouth daily.    [provider]    Family History  Problem Relation Age of Onset  . Heart disease Mother   . Hypertension Mother   . Atrial fibrillation Mother   . Diabetes Brother   . Heart disease Brother   . Hypertension  Brother   . Diabetes Paternal Grandfather   . Stroke Father   . Prostate cancer Brother   . Hypertension Brother   . Healthy Daughter   . Diabetes Son   . Breast cancer Neg Hx      Social History   Tobacco Use  . Smoking status: Former Smoker    Packs/day: 0.25    Years: 10.00    Pack years: 2.50    Types: Cigarettes    Quit date: 10/18/1965    Years since quitting: 54.4  . Smokeless tobacco: Never Used  . Tobacco comment: smoking cessation materials not required  Vaping Use  . Vaping Use: Never used  Substance Use Topics  . Alcohol use: Not Currently    Alcohol/week: 0.0 standard drinks  . Drug use: No    Allergies as of 03/03/2020 - Review Complete 01/08/2020  Allergen Reaction Noted  .  Clarithromycin  01/13/2015  . Sulfa antibiotics  01/13/2015  . Tetracyclines & related  01/13/2015    Review of Systems:    All systems reviewed and negative except where noted in HPI.   Physical Exam:  There were no vitals taken for this visit. No LMP recorded. Patient is postmenopausal. General:   Alert,  Well-developed, well-nourished, pleasant and cooperative in NAD Head:  Normocephalic and atraumatic. Eyes:  Sclera clear, no icterus.   Conjunctiva pink. Ears:  Normal auditory acuity. Neck:  Supple; no masses or thyromegaly. Lungs:  Respirations even and unlabored.  Clear throughout to auscultation.   No wheezes, crackles, or rhonchi. No acute distress. Heart:  Regular rate and rhythm; no murmurs, clicks, rubs, or gallops. Abdomen:  Normal bowel sounds.  No bruits.  Soft, non-tender and non-distended without masses, hepatosplenomegaly or hernias noted.  No guarding or rebound tenderness.  Negative Carnett sign.   Rectal:  Deferred.  Pulses:  Normal pulses noted. Extremities:  No clubbing or edema.  No cyanosis. Neurologic:  Alert and oriented x3;  grossly normal neurologically. Skin:  Intact without significant lesions or rashes.  No jaundice. Lymph Nodes:  No significant cervical adenopathy. Psych:  Alert and cooperative. Normal mood and affect.  Imaging Studies: No results found.  Assessment and Plan:   Jiayi Lengacher is a 79 y.o. y/o female who comes in today with a history of a right hemicolectomy for a advanced polyp and a colonoscopy 2015 that recommended a repeat colonoscopy in 3 years.  The patient has not had a repeat colonoscopy and is now being sent to me for heme positive stools.  The patient has been given the option of whether or not she wanted to proceed with a colonoscopy at her age.  The patient states that because of her family history of longevity she does not want to end up with a another surgery for colon polyps.  The patient would like to be set up for  colonoscopy and will be set up for colonoscopy the next eval appointment.  The patient has been explained the risks and agrees with them.      Lucilla Lame, MD. Marval Regal    Note: This dictation was prepared with Dragon dictation along with smaller phrase technology. Any transcriptional errors that result from this process are unintentional.

## 2020-03-04 ENCOUNTER — Other Ambulatory Visit: Payer: Self-pay

## 2020-03-04 DIAGNOSIS — Z86010 Personal history of colonic polyps: Secondary | ICD-10-CM

## 2020-03-08 ENCOUNTER — Ambulatory Visit (INDEPENDENT_AMBULATORY_CARE_PROVIDER_SITE_OTHER): Payer: Medicare Other | Admitting: Family Medicine

## 2020-03-08 ENCOUNTER — Other Ambulatory Visit: Payer: Self-pay

## 2020-03-08 ENCOUNTER — Encounter: Payer: Self-pay | Admitting: Family Medicine

## 2020-03-08 VITALS — BP 130/80 | HR 80 | Ht 65.0 in | Wt 144.0 lb

## 2020-03-08 DIAGNOSIS — I10 Essential (primary) hypertension: Secondary | ICD-10-CM | POA: Diagnosis not present

## 2020-03-08 DIAGNOSIS — Z23 Encounter for immunization: Secondary | ICD-10-CM | POA: Diagnosis not present

## 2020-03-08 DIAGNOSIS — E782 Mixed hyperlipidemia: Secondary | ICD-10-CM | POA: Diagnosis not present

## 2020-03-08 DIAGNOSIS — E119 Type 2 diabetes mellitus without complications: Secondary | ICD-10-CM | POA: Diagnosis not present

## 2020-03-08 MED ORDER — SIMVASTATIN 40 MG PO TABS: 40.0000 mg | ORAL_TABLET | Freq: Every day | ORAL | 1 refills | Status: DC

## 2020-03-08 MED ORDER — HYDROCHLOROTHIAZIDE 25 MG PO TABS: 25.0000 mg | ORAL_TABLET | Freq: Every day | ORAL | 1 refills | Status: DC

## 2020-03-08 MED ORDER — LOSARTAN POTASSIUM 100 MG PO TABS: 100.0000 mg | ORAL_TABLET | Freq: Every day | ORAL | 1 refills | Status: DC

## 2020-03-08 NOTE — Progress Notes (Signed)
Date:  03/08/2020   Name:  Angela Pope   DOB:  [birth year]   MRN:  [id number]   Chief Complaint: Hypertension, Hyperlipidemia, Diabetes (no meds needed right now), and Flu Vaccine  Hypertension This is a chronic problem. The current episode started more than 1 year ago. The problem has been gradually improving since onset. The problem is controlled. Pertinent negatives include no anxiety, blurred vision, chest pain, headaches, malaise/fatigue, neck pain, orthopnea, palpitations, peripheral edema, PND, shortness of breath or sweats. There are no associated agents to hypertension. Risk factors for coronary artery disease include dyslipidemia. Past treatments include angiotensin blockers and diuretics. The current treatment provides moderate improvement. There are no compliance problems.  There is no history of angina, kidney disease, CAD/MI, CVA, heart failure, left ventricular hypertrophy, PVD or retinopathy. There is no history of chronic renal disease, a hypertension causing med or renovascular disease.  Hyperlipidemia This is a chronic problem. The current episode started more than 1 year ago. The problem is controlled. Recent lipid tests were reviewed and are normal. She has no history of chronic renal disease, diabetes, hypothyroidism, liver disease, obesity or nephrotic syndrome. Factors aggravating her hyperlipidemia include thiazides. Pertinent negatives include no chest pain, focal sensory loss, focal weakness, leg pain, myalgias or shortness of breath. Current antihyperlipidemic treatment includes statins. The current treatment provides mild improvement of lipids. There are no compliance problems.  Risk factors for coronary artery disease include post-menopausal, diabetes mellitus and dyslipidemia.  Diabetes She presents for her follow-up diabetic visit. She has type 2 diabetes mellitus. Her disease course has been stable. There are no hypoglycemic associated symptoms. Pertinent negatives  for hypoglycemia include no dizziness, headaches, nervousness/anxiousness or sweats. Pertinent negatives for diabetes include no blurred vision, no chest pain, no fatigue, no foot paresthesias, no foot ulcerations, no polydipsia, no polyphagia, no polyuria, no visual change, no weakness and no weight loss. There are no hypoglycemic complications. Symptoms are stable. There are no diabetic complications. Pertinent negatives for diabetic complications include no CVA, PVD or retinopathy. Risk factors for coronary artery disease include hypertension and dyslipidemia. Current diabetic treatment includes oral agent (monotherapy). She is compliant with treatment some of the time. Her weight is stable. She is following a generally healthy diet. Meal planning includes avoidance of concentrated sweets and carbohydrate counting. She participates in exercise daily. Her breakfast blood glucose is taken between 8-9 am. Her breakfast blood glucose range is generally 110-130 mg/dl. An ACE inhibitor/angiotensin II receptor blocker is being taken.    Lab Results  Component Value Date   CREATININE 0.82 11/03/2019   BUN 13 11/03/2019   NA 141 11/03/2019   K 3.9 11/03/2019   CL 99 11/03/2019   CO2 28 11/03/2019   Lab Results  Component Value Date   CHOL 146 11/03/2019   HDL 50 11/03/2019   LDLCALC 69 11/03/2019   TRIG 158 (H) 11/03/2019   CHOLHDL 3.4 03/27/2018   Lab Results  Component Value Date   TSH 1.260 03/27/2017   Lab Results  Component Value Date   HGBA1C 5.9 (H) 11/03/2019   Lab Results  Component Value Date   WBC 6.2 11/03/2019   HGB 15.8 11/03/2019   HCT 48.6 (H) 11/03/2019   MCV 94 11/03/2019   PLT 306 11/03/2019   Lab Results  Component Value Date   ALT 13 11/03/2019   AST 16 11/03/2019   ALKPHOS 67 11/03/2019   BILITOT 1.8 (H) 11/03/2019     Review of Systems  Constitutional: Negative.  Negative for chills, fatigue, fever, malaise/fatigue, unexpected weight change and weight  loss.  HENT: Negative for congestion, ear discharge, ear pain, rhinorrhea, sinus pressure, sneezing and sore throat.   Eyes: Negative for blurred vision, photophobia, pain, discharge, redness and itching.  Respiratory: Negative for cough, shortness of breath, wheezing and stridor.   Cardiovascular: Negative for chest pain, palpitations, orthopnea and PND.  Gastrointestinal: Negative for abdominal pain, blood in stool, constipation, diarrhea, nausea and vomiting.  Endocrine: Negative for cold intolerance, heat intolerance, polydipsia, polyphagia and polyuria.  Genitourinary: Negative for dysuria, flank pain, frequency, hematuria, menstrual problem, pelvic pain, urgency, vaginal bleeding and vaginal discharge.  Musculoskeletal: Negative for arthralgias, back pain, myalgias and neck pain.  Skin: Negative for rash.  Allergic/Immunologic: Negative for environmental allergies and food allergies.  Neurological: Negative for dizziness, focal weakness, weakness, light-headedness, numbness and headaches.  Hematological: Negative for adenopathy. Does not bruise/bleed easily.  Psychiatric/Behavioral: Negative for dysphoric mood. The patient is not nervous/anxious.     Patient Active Problem List   Diagnosis Date Noted  . 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 Memory change 01/15/2020  . Osteoarthritis 09/17/2017  . 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 Bilateral lower extremity edema 01/14/2015  . HTN (hypertension) 01/13/2015  . Diabetes mellitus type 2, controlled, without complications ([place]) 11/91/4782  . Hyperlipidemia 01/13/2015  . Overweight (BMI 25.0-29.9) 01/13/2015  . Allergic rhinitis 01/13/2015  . Hx of colonic polyps 01/13/2015  . 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 Vitamin D deficiency 01/13/2015  . FH: heart disease 01/13/2015  . FH: stroke 01/13/2015  . Incomplete emptying of bladder 07/02/2009  . 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     Allergies  Allergen Reactions  . Clarithromycin   . Sulfa Antibiotics   . Tetracyclines & Related     Past Surgical History:  Procedure Laterality Date  . COLONOSCOPY  2015  . large intestine surgery    . SMALL INTESTINE SURGERY  2012   polyp that could't be removed    Social History   Tobacco Use  . Smoking status: Former Smoker    Packs/day: 0.25    Years: 10.00    Pack years: 2.50    Types: Cigarettes    Quit date: 10/18/1965    Years since quitting: 54.4  . Smokeless tobacco: Never Used  . Tobacco comment: smoking cessation materials not required  Vaping Use  . Vaping Use: Never used  Substance Use Topics  . Alcohol use: Not Currently    Alcohol/week: 0.0 standard drinks  . Drug use: No     Medication list has been reviewed and updated.  Current Meds  Medication Sig  . Calcium Carb-Cholecalciferol (CALCIUM 1000 + D) 1000-800 MG-UNIT TABS Take 1 tablet by mouth daily.  Marland Kitchen glucose blood test strip Use to check Blood Sugar 3 times daily for ICD10: E11.9(One touch ultra blue brand. )  . hydrochlorothiazide (HYDRODIURIL) 25 MG tablet Take 1 tablet (25 mg total) by mouth daily.  . Lancets (ONETOUCH ULTRASOFT) lancets Use to check Blood Sugar 3 times daily for ICD 10:  E11.9  (Touch Twist Lancets for Ultra Touch)  . loperamide (IMODIUM) 2 MG capsule Take 2 mg by mouth as needed for diarrhea or loose stools.  Marland Kitchen losartan (COZAAR) 100 MG tablet Take 1 tablet (100 mg total) by mouth daily.  . metFORMIN (GLUCOPHAGE) 500 MG tablet Take 1 tablet (500 mg total) by mouth 2 (two) times daily  with a meal.  . Multiple Vitamin (MULTIVITAMIN) capsule Take 1 capsule by mouth daily.  . simvastatin (ZOCOR) 40 MG tablet Take 1 tablet (40 mg total) by mouth daily.  . vitamin E (VITAMIN E) 180 MG (400 UNITS) capsule Take 400 Units by mouth daily.    PHQ 2/9 Scores 03/08/2020 01/07/2020 11/03/2019 03/18/2019  PHQ - 2 Score 0 1 1 2   PHQ- 9 Score 0 - 1 7    GAD 7 : Generalized Anxiety  Score 03/08/2020 11/03/2019  Nervous, Anxious, on Edge 0 1  Control/stop worrying 0 0  Worry too much - different things 0 0  Trouble relaxing 0 0  Restless 0 0  Easily annoyed or irritable 0 0  Afraid - awful might happen 0 0  Total GAD 7 Score 0 1  Anxiety Difficulty - Not difficult at all    BP Readings from Last 3 Encounters:  03/08/20 130/80  03/03/20 118/66  01/08/20 110/64    Physical Exam Vitals and nursing note reviewed.  Constitutional:      Appearance: She is well-developed.  HENT:     Head: Normocephalic.     Right Ear: Tympanic membrane, ear canal and external ear normal. There is no impacted cerumen.     Left Ear: Tympanic membrane, ear canal and external ear normal. There is no impacted cerumen.     Nose: Nose normal.     Mouth/Throat:     Mouth: Mucous membranes are moist.     Pharynx: Oropharynx is clear. No oropharyngeal exudate.  Eyes:     General: Lids are everted, no foreign bodies appreciated. No scleral icterus.       Left eye: No foreign body or hordeolum.     Extraocular Movements: Extraocular movements intact.     Conjunctiva/sclera: Conjunctivae normal.     Right eye: Right conjunctiva is not injected.     Left eye: Left conjunctiva is not injected.     Pupils: Pupils are equal, round, and reactive to light.  Neck:     Thyroid: No thyromegaly.     Vascular: No JVD.     Trachea: No tracheal deviation.  Cardiovascular:     Rate and Rhythm: Normal rate and regular rhythm.     Heart sounds: Normal heart sounds. No murmur heard.  No friction rub. No gallop.   Pulmonary:     Effort: Pulmonary effort is normal. No respiratory distress.     Breath sounds: Normal breath sounds. No wheezing, rhonchi or rales.  Abdominal:     General: Bowel sounds are normal.     Palpations: Abdomen is soft. There is no mass.     Tenderness: There is no abdominal tenderness. There is no guarding or rebound.  Musculoskeletal:        General: No tenderness. Normal  range of motion.     Cervical back: Normal range of motion and neck supple.  Lymphadenopathy:     Cervical: No cervical adenopathy.  Skin:    General: Skin is warm.     Capillary Refill: Capillary refill takes less than 2 seconds.     Findings: No rash.  Neurological:     Mental Status: She is alert and oriented to person, place, and time.     Cranial Nerves: No cranial nerve deficit.     Deep Tendon Reflexes: Reflexes normal.  Psychiatric:        Mood and Affect: Mood is not anxious or depressed.     Wt Readings from  Last 3 Encounters:  03/08/20 144 lb (65.3 kg)  03/03/20 144 lb 6.4 oz (65.5 kg)  01/08/20 148 lb (67.1 kg)    BP 130/80   Pulse 80   Ht 5\' 5"  (1.651 m)   Wt 144 lb (65.3 kg)   BMI 23.96 kg/m   Assessment and Plan: 1. Controlled type 2 diabetes mellitus without complication, without long-term current use of insulin (HCC) Chronic.  Controlled.  Stable.  Patient will continuing Metformin 500 mg once a day.  We will check A1c and microalbuminuria for current status. - HgB A1c - Microalbumin, urine  2. Essential hypertension Chronic.  Controlled.  Stable.  Patient is currently on and will continue hydrochlorothiazide 25 mg and losartan 100 mg once a day.  Blood pressure reading today is 130/80.  We will recheck patient in 4 weeks. - hydrochlorothiazide (HYDRODIURIL) 25 MG tablet; Take 1 tablet (25 mg total) by mouth daily.  Dispense: 90 tablet; Refill: 1 - losartan (COZAAR) 100 MG tablet; Take 1 tablet (100 mg total) by mouth daily.  Dispense: 90 tablet; Refill: 1  3. Mixed hyperlipidemia .  Controlled.  Stable.  Continue simvastatin 40 mg once a day. - simvastatin (ZOCOR) 40 MG tablet; Take 1 tablet (40 mg total) by mouth daily.  Dispense: 90 tablet; Refill: 1  4. Need for immunization against influenza Discussed and administered. - Flu Vaccine QUAD High Dose(Fluad)

## 2020-03-15 ENCOUNTER — Ambulatory Visit: Payer: Medicare Other

## 2020-03-19 DIAGNOSIS — E119 Type 2 diabetes mellitus without complications: Secondary | ICD-10-CM | POA: Diagnosis not present

## 2020-03-20 LAB — HEMOGLOBIN A1C
Est. average glucose Bld gHb Est-mCnc: 114 mg/dL
Hgb A1c MFr Bld: 5.6 % (ref 4.8–5.6)

## 2020-03-20 LAB — MICROALBUMIN, URINE: Microalbumin, Urine: 19.1 ug/mL

## 2020-03-22 ENCOUNTER — Telehealth: Payer: Self-pay

## 2020-03-22 NOTE — Telephone Encounter (Signed)
Spoke to pt

## 2020-03-22 NOTE — Telephone Encounter (Unsigned)
Copied from [place] [id number]. Topic: Quick Communication - See Telephone Encounter >> Mar 22, 2020 10:47 AM Loma [place] wrote: CRM for notification. See Telephone encounter for: 03/22/20. Pls fu back up with Labs results from [place], [place] missed called [phone]

## 2020-03-22 NOTE — Telephone Encounter (Signed)
Please just call and let her know I was just letting her know labs were normal

## 2020-03-29 LAB — HM DIABETES EYE EXAM

## 2020-03-30 ENCOUNTER — Other Ambulatory Visit: Payer: Self-pay

## 2020-03-30 ENCOUNTER — Encounter: Payer: Self-pay | Admitting: Gastroenterology

## 2020-03-31 ENCOUNTER — Encounter: Payer: Self-pay | Admitting: Anesthesiology

## 2020-03-31 NOTE — Anesthesia Preprocedure Evaluation (Deleted)
Anesthesia Evaluation    History of Anesthesia Complications (+) Family history of anesthesia reaction and history of anesthetic complications (Multiple anesthetics with no reported issues- is certain no MH in the family but sounds like pseudocholinesterase deficiency.)  Airway        Dental   Pulmonary former smoker,           Cardiovascular hypertension,      Neuro/Psych    GI/Hepatic   Endo/Other  diabetes  Renal/GU      Musculoskeletal   Abdominal   Peds  Hematology   Anesthesia Other Findings   Reproductive/Obstetrics                             Anesthesia Physical Anesthesia Plan Anesthesia Quick Evaluation

## 2020-04-01 ENCOUNTER — Other Ambulatory Visit: Admission: RE | Admit: 2020-04-01 | Payer: Medicare Other | Source: Ambulatory Visit

## 2020-04-01 NOTE — Discharge Instructions (Signed)
General Anesthesia, Adult, Care After This sheet gives you information about how to care for yourself after your procedure. Your health care provider may also give you more specific instructions. If you have problems or questions, contact your health care provider. What can I expect after the procedure? After the procedure, the following side effects are common:  Pain or discomfort at the IV site.  Nausea.  Vomiting.  Sore throat.  Trouble concentrating.  Feeling cold or chills.  Weak or tired.  Sleepiness and fatigue.  Soreness and body aches. These side effects can affect parts of the body that were not involved in surgery. Follow these instructions at home:  For at least 24 hours after the procedure:  Have a responsible adult stay with you. It is important to have someone help care for you until you are awake and alert.  Rest as needed.  Do not: ? Participate in activities in which you could fall or become injured. ? Drive. ? Use heavy machinery. ? Drink alcohol. ? Take sleeping pills or medicines that cause drowsiness. ? Make important decisions or sign legal documents. ? Take care of children on your own. Eating and drinking  Follow any instructions from your health care provider about eating or drinking restrictions.  When you feel hungry, start by eating small amounts of foods that are soft and easy to digest (bland), such as toast. Gradually return to your regular diet.  Drink enough fluid to keep your urine pale yellow.  If you vomit, rehydrate by drinking water, juice, or clear broth. General instructions  If you have sleep apnea, surgery and certain medicines can increase your risk for breathing problems. Follow instructions from your health care provider about wearing your sleep device: ? Anytime you are sleeping, including during daytime naps. ? While taking prescription pain medicines, sleeping medicines, or medicines that make you drowsy.  Return to  your normal activities as told by your health care provider. Ask your health care provider what activities are safe for you.  Take over-the-counter and prescription medicines only as told by your health care provider.  If you smoke, do not smoke without supervision.  Keep all follow-up visits as told by your health care provider. This is important. Contact a health care provider if:  You have nausea or vomiting that does not get better with medicine.  You cannot eat or drink without vomiting.  You have pain that does not get better with medicine.  You are unable to pass urine.  You develop a skin rash.  You have a fever.  You have redness around your IV site that gets worse. Get help right away if:  You have difficulty breathing.  You have chest pain.  You have blood in your urine or stool, or you vomit blood. Summary  After the procedure, it is common to have a sore throat or nausea. It is also common to feel tired.  Have a responsible adult stay with you for the first 24 hours after general anesthesia. It is important to have someone help care for you until you are awake and alert.  When you feel hungry, start by eating small amounts of foods that are soft and easy to digest (bland), such as toast. Gradually return to your regular diet.  Drink enough fluid to keep your urine pale yellow.  Return to your normal activities as told by your health care provider. Ask your health care provider what activities are safe for you. This information is not   intended to replace advice given to you by your health care provider. Make sure you discuss any questions you have with your health care provider. Document Revised: 05/18/2017 Document Reviewed: 12/29/2016 Elsevier Patient Education  2020 [employer]vier Inc.  

## 2020-04-05 ENCOUNTER — Ambulatory Visit: Admission: RE | Admit: 2020-04-05 | Payer: Medicare Other | Source: Home / Self Care | Admitting: Gastroenterology

## 2020-04-05 HISTORY — DX: Family history of other specified conditions: Z84.89

## 2020-04-05 SURGERY — COLONOSCOPY WITH PROPOFOL
Anesthesia: Choice

## 2020-04-07 ENCOUNTER — Other Ambulatory Visit: Payer: Self-pay | Admitting: Family Medicine

## 2020-04-07 DIAGNOSIS — E782 Mixed hyperlipidemia: Secondary | ICD-10-CM

## 2020-04-07 DIAGNOSIS — I10 Essential (primary) hypertension: Secondary | ICD-10-CM

## 2020-04-07 MED ORDER — LOSARTAN POTASSIUM 100 MG PO TABS: 100.0000 mg | ORAL_TABLET | Freq: Every day | ORAL | 1 refills | Status: DC

## 2020-04-07 MED ORDER — SIMVASTATIN 40 MG PO TABS: 40.0000 mg | ORAL_TABLET | Freq: Every day | ORAL | 1 refills | Status: DC

## 2020-04-07 MED ORDER — HYDROCHLOROTHIAZIDE 25 MG PO TABS: 25.0000 mg | ORAL_TABLET | Freq: Every day | ORAL | 1 refills | Status: DC

## 2020-04-07 NOTE — Telephone Encounter (Signed)
Medication Refill - Medication: hydrochlorothiazide (HYDRODIURIL) 25 MG tablet losartan (COZAAR) 100 MG tablet simvastatin (ZOCOR) 40 MG tablet      Preferred Pharmacy (with phone number or street name):  [facility] [place] [facility] [place] Phone:  [phone]  Fax:  [phone]       Agent: Please be advised that RX refills may take up to 3 business days. We ask that you follow-up with your pharmacy.

## 2020-04-29 ENCOUNTER — Telehealth: Payer: Self-pay

## 2020-04-29 NOTE — Telephone Encounter (Signed)
Please call pt to schedule appt for memory/ handicap placard.   KP

## 2020-04-29 NOTE — Telephone Encounter (Signed)
Needs handicap form filled out, also daughter just wanted to let you  know pt having memory and temper issues. Will discuss with pt in appt is needed.

## 2020-04-29 NOTE — Telephone Encounter (Unsigned)
Copied from [place] [id number]. Topic: General - Other >> Apr 29, 2020  1:17 PM Rainey [place] A wrote: Patients daughter is concerned with patients temper and memory and wanted to get this information to Dr. Ronnald Ramp. Daughter also wants to have handicap sticker plackard form filled out. Please advise  Best contact [place]

## 2020-05-03 NOTE — Telephone Encounter (Signed)
If she needs to discuss memory- yes

## 2020-05-03 NOTE — Telephone Encounter (Signed)
No just wants form filled out, the daughter said she was going to talk to mrs. Heitzenrater first and then see about setting up an appointment

## 2020-05-03 NOTE — Telephone Encounter (Signed)
So does she have an appt?

## 2020-05-03 NOTE — Telephone Encounter (Signed)
Does she need appointment in order to have form filled out?

## 2020-05-06 ENCOUNTER — Other Ambulatory Visit: Payer: Self-pay

## 2020-05-06 ENCOUNTER — Ambulatory Visit (INDEPENDENT_AMBULATORY_CARE_PROVIDER_SITE_OTHER): Payer: Medicare Other | Admitting: Family Medicine

## 2020-05-06 ENCOUNTER — Encounter: Payer: Self-pay | Admitting: Family Medicine

## 2020-05-06 VITALS — BP 120/74 | HR 72 | Ht 65.0 in | Wt 140.0 lb

## 2020-05-06 DIAGNOSIS — E119 Type 2 diabetes mellitus without complications: Secondary | ICD-10-CM | POA: Diagnosis not present

## 2020-05-06 DIAGNOSIS — R413 Other amnesia: Secondary | ICD-10-CM | POA: Diagnosis not present

## 2020-05-06 MED ORDER — METFORMIN HCL 500 MG PO TABS: 500.0000 mg | ORAL_TABLET | Freq: Two times a day (BID) | ORAL | 1 refills | Status: DC

## 2020-05-06 NOTE — Progress Notes (Signed)
Date:  05/06/2020   Name:  Angela Pope   DOB:  [birth year]   MRN:  [id number]   Chief Complaint: Follow-up (Foot exam and memory testing)  Diabetes She presents for her follow-up diabetic visit. She has type 2 diabetes mellitus. Her disease course has been stable. Pertinent negatives for hypoglycemia include no dizziness, headaches or nervousness/anxiousness. There are no diabetic associated symptoms. Pertinent negatives for diabetes include no fatigue, no polydipsia, no polyphagia, no polyuria and no weakness. Symptoms are stable. There are no diabetic complications. Current diabetic treatment includes oral agent (monotherapy). She is compliant with treatment all of the time. Her weight is stable. Meal planning includes avoidance of concentrated sweets and carbohydrate counting. She participates in exercise intermittently. Her breakfast blood glucose is taken between 8-9 am. Her breakfast blood glucose range is generally 90-110 mg/dl. An ACE inhibitor/angiotensin II receptor blocker is being taken.    Lab Results  Component Value Date   CREATININE 0.82 11/03/2019   BUN 13 11/03/2019   NA 141 11/03/2019   K 3.9 11/03/2019   CL 99 11/03/2019   CO2 28 11/03/2019   Lab Results  Component Value Date   CHOL 146 11/03/2019   HDL 50 11/03/2019   LDLCALC 69 11/03/2019   TRIG 158 (H) 11/03/2019   CHOLHDL 3.4 03/27/2018   Lab Results  Component Value Date   TSH 1.260 03/27/2017   Lab Results  Component Value Date   HGBA1C 5.6 03/19/2020   Lab Results  Component Value Date   WBC 6.2 11/03/2019   HGB 15.8 11/03/2019   HCT 48.6 (H) 11/03/2019   MCV 94 11/03/2019   PLT 306 11/03/2019   Lab Results  Component Value Date   ALT 13 11/03/2019   AST 16 11/03/2019   ALKPHOS 67 11/03/2019   BILITOT 1.8 (H) 11/03/2019     Review of Systems  Constitutional: Negative.  Negative for chills, fatigue, fever and unexpected weight change.  HENT: Negative for congestion, ear discharge,  ear pain, rhinorrhea, sinus pressure, sneezing and sore throat.   Eyes: Negative for double vision, photophobia, pain, discharge, redness and itching.  Respiratory: Negative for cough, shortness of breath, wheezing and stridor.   Gastrointestinal: Negative for abdominal pain, blood in stool, constipation, diarrhea, nausea and vomiting.  Endocrine: Negative for cold intolerance, heat intolerance, polydipsia, polyphagia and polyuria.  Genitourinary: Negative for dysuria, flank pain, frequency, hematuria, menstrual problem, pelvic pain, urgency, vaginal bleeding and vaginal discharge.  Musculoskeletal: Negative for arthralgias, back pain and myalgias.  Skin: Negative for rash.  Allergic/Immunologic: Negative for environmental allergies and food allergies.  Neurological: Negative for dizziness, weakness, light-headedness, numbness and headaches.  Hematological: Negative for adenopathy. Does not bruise/bleed easily.  Psychiatric/Behavioral: Negative for dysphoric mood. The patient is not nervous/anxious.     Patient Active Problem List   Diagnosis Date Noted  . 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 Memory change 01/15/2020  . Osteoarthritis 09/17/2017  . 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 Bilateral lower extremity edema 01/14/2015  . Essential hypertension 01/13/2015  . Diabetes mellitus type 2, controlled, without complications ([place]) 36/64/4034  . Hyperlipidemia 01/13/2015  . Overweight (BMI 25.0-29.9) 01/13/2015  . Allergic rhinitis 01/13/2015  . Hx of colonic polyps 01/13/2015  . 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 Vitamin D deficiency 01/13/2015  . FH: heart disease 01/13/2015  . FH: stroke 01/13/2015  . Incomplete emptying of bladder 07/02/2009  . Mixed incontinence urge and 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)  07/02/2009    Allergies  Allergen Reactions  . Sulfa Antibiotics   . Tetracyclines & Related   . Clarithromycin Rash    Past Surgical History:  Procedure  Laterality Date  . COLONOSCOPY  2015  . large intestine surgery    . SMALL INTESTINE SURGERY  2012   polyp that could't be removed    Social History   Tobacco Use  . Smoking status: Former Smoker    Packs/day: 0.25    Years: 10.00    Pack years: 2.50    Types: Cigarettes    Quit date: 10/18/1965    Years since quitting: 54.5  . Smokeless tobacco: Never Used  . Tobacco comment: smoking cessation materials not required  Vaping Use  . Vaping Use: Never used  Substance Use Topics  . Alcohol use: Not Currently    Alcohol/week: 0.0 standard drinks  . Drug use: No     Medication list has been reviewed and updated.  Current Meds  Medication Sig  . Calcium Carb-Cholecalciferol (CALCIUM 1000 + D) 1000-800 MG-UNIT TABS Take 1 tablet by mouth daily.  Marland Kitchen glucose blood test strip Use to check Blood Sugar 3 times daily for ICD10: E11.9(One touch ultra blue brand. )  . hydrochlorothiazide (HYDRODIURIL) 25 MG tablet Take 1 tablet (25 mg total) by mouth daily.  . Lancets (ONETOUCH ULTRASOFT) lancets Use to check Blood Sugar 3 times daily for ICD 10:  E11.9  (Touch Twist Lancets for Ultra Touch)  . loperamide (IMODIUM) 2 MG capsule Take 2 mg by mouth as needed for diarrhea or loose stools.  Marland Kitchen losartan (COZAAR) 100 MG tablet Take 1 tablet (100 mg total) by mouth daily.  . metFORMIN (GLUCOPHAGE) 500 MG tablet Take 1 tablet (500 mg total) by mouth 2 (two) times daily with a meal.  . Multiple Vitamin (MULTIVITAMIN) capsule Take 1 capsule by mouth daily.  . simvastatin (ZOCOR) 40 MG tablet Take 1 tablet (40 mg total) by mouth daily.  . vitamin E 180 MG (400 UNITS) capsule Take 400 Units by mouth daily.    PHQ 2/9 Scores 03/08/2020 01/07/2020 11/03/2019 03/18/2019  PHQ - 2 Score 0 1 1 2   PHQ- 9 Score 0 - 1 7    GAD 7 : Generalized Anxiety Score 03/08/2020 11/03/2019  Nervous, Anxious, on Edge 0 1  Control/stop worrying 0 0  Worry too much - different things 0 0  Trouble relaxing 0 0  Restless 0  0  Easily annoyed or irritable 0 0  Afraid - awful might happen 0 0  Total GAD 7 Score 0 1  Anxiety Difficulty - Not difficult at all    BP Readings from Last 3 Encounters:  05/06/20 120/74  03/08/20 130/80  03/03/20 118/66    Physical Exam Constitutional:      Appearance: She is well-developed and well-nourished.  HENT:     Head: Normocephalic.     Right Ear: External ear normal.     Left Ear: External ear normal.     Mouth/Throat:     Mouth: Oropharynx is clear and moist.  Eyes:     General: Lids are everted, no foreign bodies appreciated. No scleral icterus.       Left eye: No foreign body or hordeolum.     Extraocular Movements: EOM normal.     Conjunctiva/sclera: Conjunctivae normal.     Right eye: Right conjunctiva is not injected.     Left eye: Left conjunctiva is not injected.     Pupils:  Pupils are equal, round, and reactive to light.  Neck:     Thyroid: No thyromegaly.     Vascular: No JVD.     Trachea: No tracheal deviation.  Cardiovascular:     Rate and Rhythm: Normal rate and regular rhythm.     Pulses: Intact distal pulses.          Dorsalis pedis pulses are 2+ on the right side and 2+ on the left side.       Posterior tibial pulses are 2+ on the right side and 2+ on the left side.     Heart sounds: Normal heart sounds. No murmur heard. No friction rub. No gallop.   Pulmonary:     Effort: Pulmonary effort is normal. No respiratory distress.     Breath sounds: Normal breath sounds. No wheezing or rales.  Abdominal:     General: Bowel sounds are normal.     Palpations: Abdomen is soft. There is no hepatosplenomegaly or mass.     Tenderness: There is no abdominal tenderness. There is no guarding or rebound.  Musculoskeletal:        General: No tenderness or edema. Normal range of motion.     Cervical back: Normal range of motion and neck supple.     Right foot: Normal range of motion. No deformity or bunion.     Left foot: Normal range of motion.  Deformity present. No bunion.  Feet:     Right foot:     Protective Sensation: 10 sites tested. 10 sites sensed.     Skin integrity: Skin integrity normal. No ulcer, blister, skin breakdown, erythema, warmth, callus, dry skin or fissure.     Toenail Condition: Right toenails are normal.     Left foot:     Protective Sensation: 10 sites tested. 10 sites sensed.     Skin integrity: Skin integrity normal. No ulcer, blister, skin breakdown, erythema, warmth, callus, dry skin or fissure.     Toenail Condition: Left toenails are normal.  Lymphadenopathy:     Cervical: No cervical adenopathy.  Skin:    General: Skin is warm.     Findings: No rash.  Neurological:     Mental Status: She is alert and oriented to person, place, and time.     Cranial Nerves: No cranial nerve deficit.     Deep Tendon Reflexes: Strength normal. Reflexes normal.  Psychiatric:        Mood and Affect: Mood and affect normal. Mood is not anxious or depressed.     Wt Readings from Last 3 Encounters:  05/06/20 140 lb (63.5 kg)  03/08/20 144 lb (65.3 kg)  03/03/20 144 lb 6.4 oz (65.5 kg)    BP 120/74   Pulse 72   Ht 5\' 5"  (1.651 m)   Wt 140 lb (63.5 kg)   BMI 23.30 kg/m   Assessment and Plan:  1. Controlled type 2 diabetes mellitus without complication, without long-term current use of insulin (HCC) Chronic.  Controlled.  Stable.  Patient is currently controlled on Metformin 500 mg 1 twice a day.  Blood sugars are in the 90-1 10 range.  Foot exam was conducted today and patient had 10/10 sensory with 2+ dorsalis pedis posterior tibial pulses. - metFORMIN (GLUCOPHAGE) 500 MG tablet; Take 1 tablet (500 mg total) by mouth 2 (two) times daily with a meal.  Dispense: 180 tablet; Refill: 1  2. Memory change Chronic.  Controlled.  Stable.  CIT 6 a 0 with a mini  cog of 4.  This is low concern for dementia.  We will continue this on a annual basis for evaluation and monitoring for change of level of stability.

## 2020-05-18 ENCOUNTER — Other Ambulatory Visit: Payer: Self-pay | Admitting: Family Medicine

## 2020-05-18 DIAGNOSIS — E119 Type 2 diabetes mellitus without complications: Secondary | ICD-10-CM

## 2020-05-18 MED ORDER — GLUCOSE BLOOD VI STRP: ORAL_STRIP | 3 refills | Status: DC

## 2020-05-18 NOTE — Telephone Encounter (Signed)
Medication Refill - Medication: glucose blood test strip (One touch Ultra Test strips)  Has the patient contacted their pharmacy? Yes.   (Agent: If no, request that the patient contact the pharmacy for the refill.) (Agent: If yes, when and what did the pharmacy advise?)  Preferred Pharmacy (with phone number or street name):  [facility] [place], [place] [address] [place] [address]  Phone: [phone] Fax: [phone]     Agent: Please be advised that RX refills may take up to 3 business days. We ask that you follow-up with your pharmacy.

## 2020-05-24 ENCOUNTER — Telehealth: Payer: Self-pay

## 2020-05-24 NOTE — Telephone Encounter (Unsigned)
Copied from CRM [id number]. Topic: General - Other >> May 20, 2020  1:35 PM Jaquita Rector A wrote: Reason for CRM: Patient daughter Denny Peon Mincey called in to inquire of Dr Yetta Barre about the Handicapped placard form. State that they spoke to the neurologist and he informed them that this has to be completed by her PCP. Please call to discuss Ph# [phone]

## 2020-05-25 NOTE — Telephone Encounter (Signed)
Spoke to daughter- explained the conditions in which we have to go by in order to give a handicap placard. She can be seen by neurology for a change in gait and get assessed for the need of a placard. As of right now, no need in one as "getting old and frail" does not qualify for a placard.

## 2020-06-01 DIAGNOSIS — E119 Type 2 diabetes mellitus without complications: Secondary | ICD-10-CM | POA: Diagnosis not present

## 2020-06-01 IMAGING — CR DG LUMBAR SPINE COMPLETE 4+V
5 series · 6 of 6 positions shown · non-contrast
Comparison: None.

CLINICAL DATA: 77-year-old female with a history of left lower back
pain for 6 days

EXAM:
LUMBAR SPINE - COMPLETE 4+ VIEW

[l-spine ap]
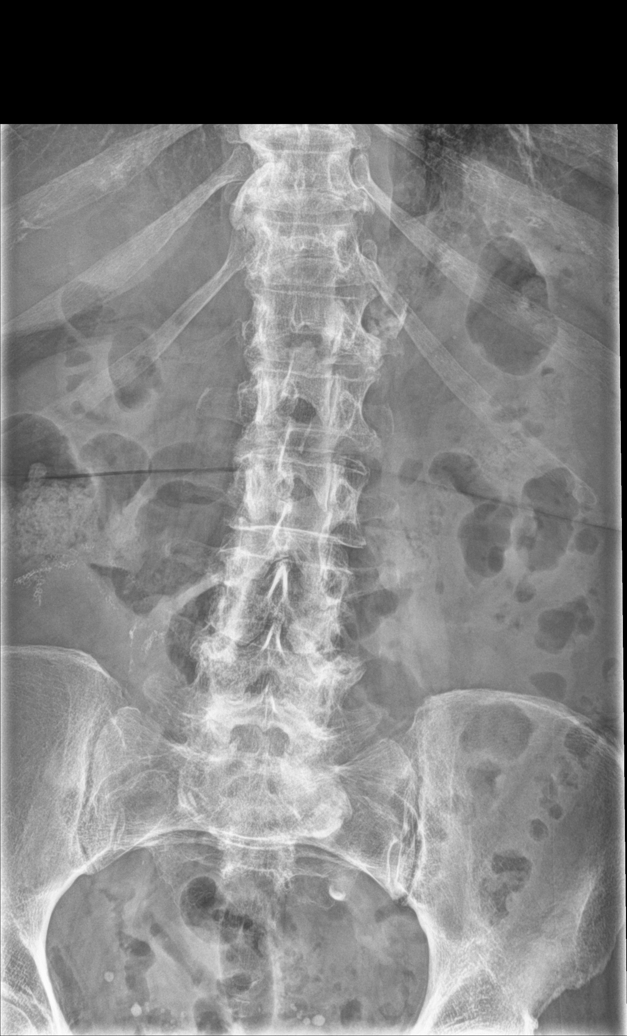

[Series 2: l-spine obl · 0.14mm/px · 2 of 2 slices shown (1 of 2)]
[im 1/2]
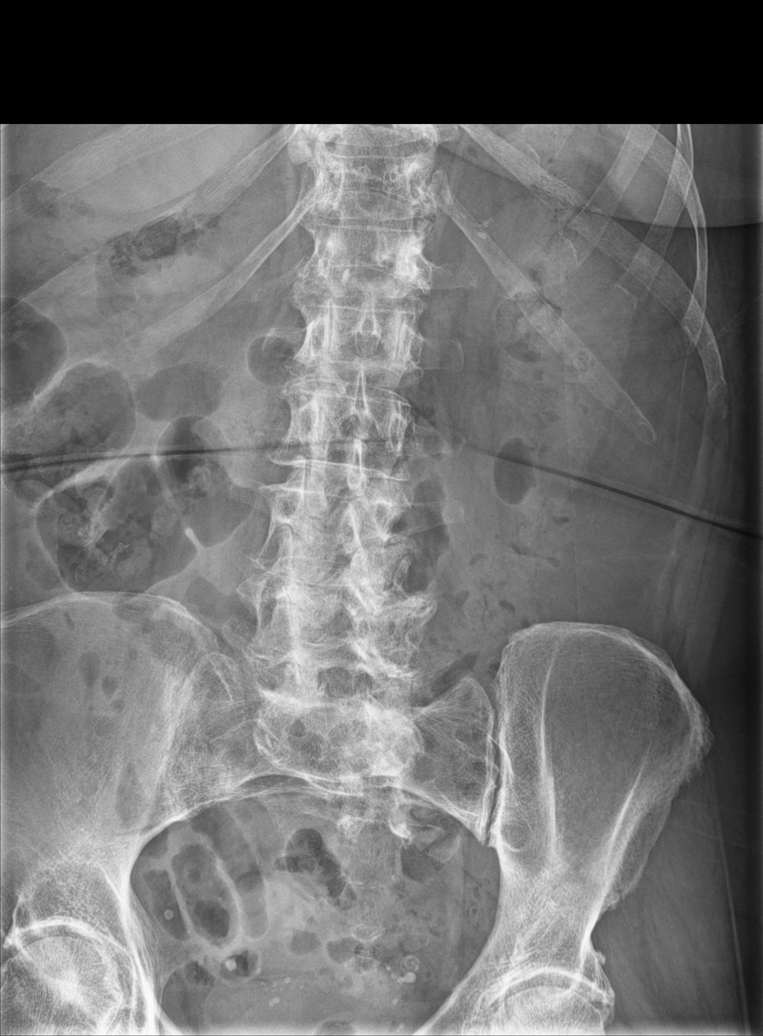
[im 2/2]
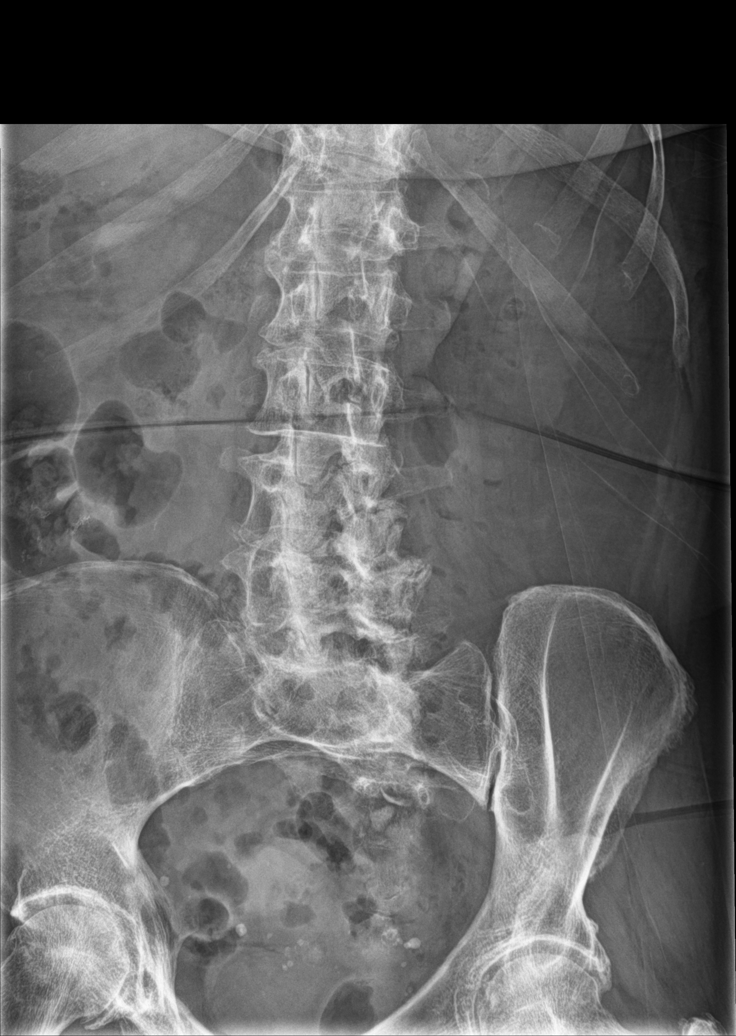

[l-spine obl (2 of 2)]
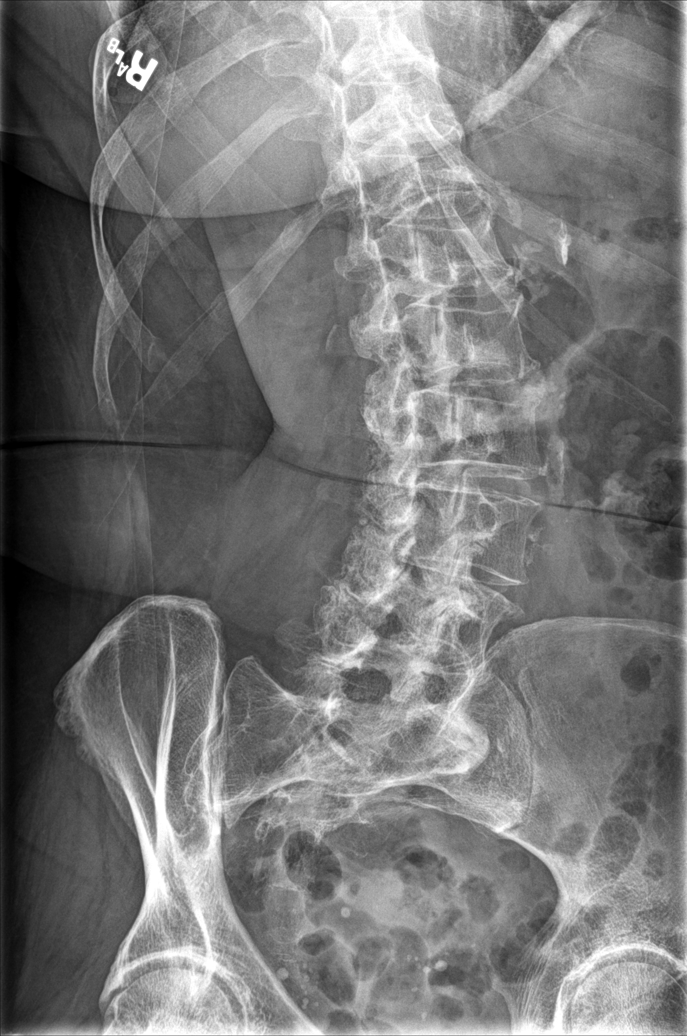

[l-spine lat]
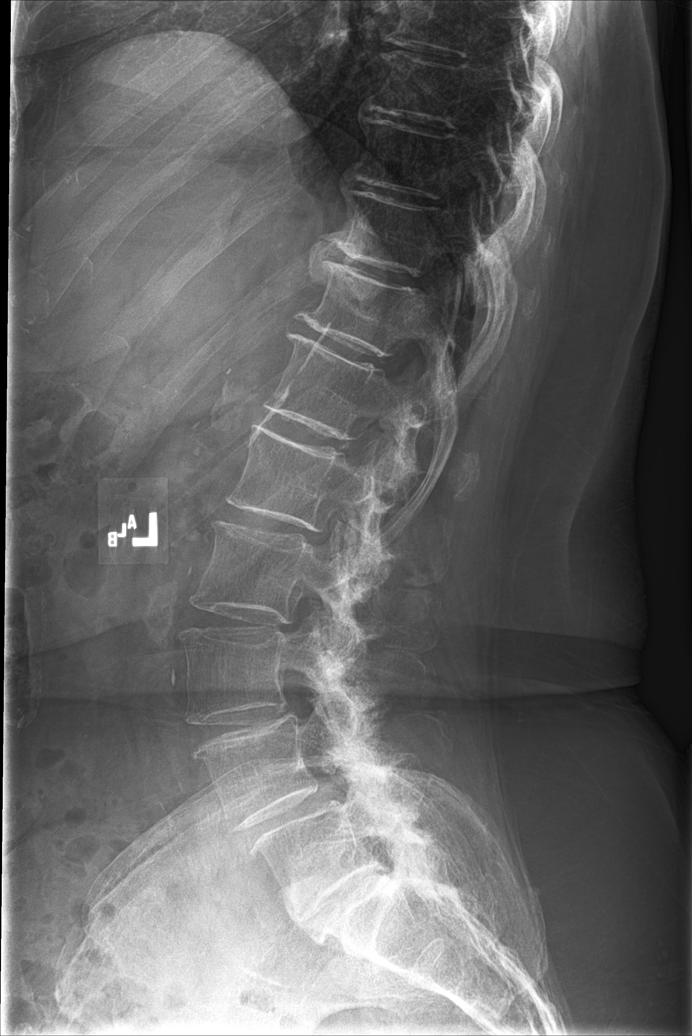

[l-spine spot]
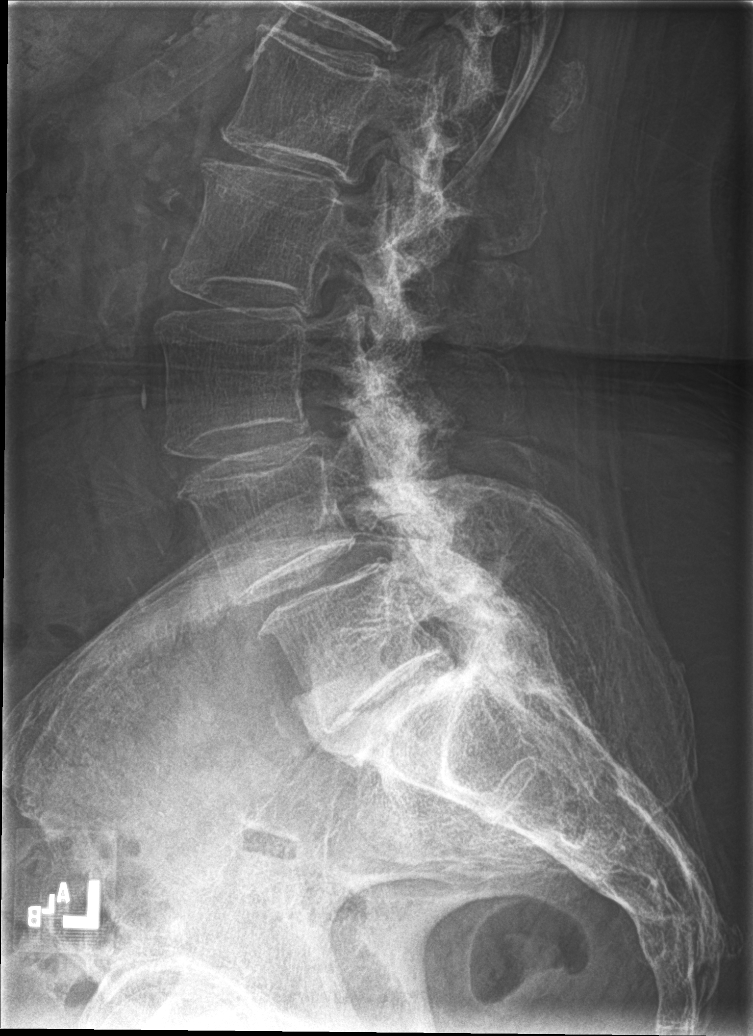

[6 of 6 positions shown; findings below may reference images not displayed]

FINDINGS: Lumbar Spine:

Note that the numbering system used on this exam designates 6 lumbar
type vertebral bodies.

Lumbar vertebral elements maintain normal alignment without evidence
of subluxation. Trace anterolisthesis of L5 on L6, likely
degenerative.

No acute fracture line identified.

Vertebral body heights maintained.

Disc space narrowing is mild throughout the lumbar spine.
Degenerative changes are most pronounced at the transitional L6-S1
level

. facet hypertrophy at L5-L6 and L6-S1.

Calcifications of the vasculature.

Oblique images demonstrate no displaced pars defect.
IMPRESSION: No acute fracture or malalignment of the lumbar spine.

Degenerative changes as above.

## 2020-06-29 ENCOUNTER — Encounter: Payer: Self-pay | Admitting: Family Medicine

## 2020-06-29 ENCOUNTER — Ambulatory Visit (INDEPENDENT_AMBULATORY_CARE_PROVIDER_SITE_OTHER): Payer: Medicare Other | Admitting: Family Medicine

## 2020-06-29 ENCOUNTER — Other Ambulatory Visit: Payer: Self-pay

## 2020-06-29 DIAGNOSIS — E119 Type 2 diabetes mellitus without complications: Secondary | ICD-10-CM

## 2020-06-29 DIAGNOSIS — I10 Essential (primary) hypertension: Secondary | ICD-10-CM

## 2020-06-29 DIAGNOSIS — E782 Mixed hyperlipidemia: Secondary | ICD-10-CM | POA: Diagnosis not present

## 2020-06-29 MED ORDER — LOSARTAN POTASSIUM 100 MG PO TABS: 100.0000 mg | ORAL_TABLET | Freq: Every day | ORAL | 1 refills | Status: DC

## 2020-06-29 MED ORDER — SIMVASTATIN 40 MG PO TABS: 40.0000 mg | ORAL_TABLET | Freq: Every day | ORAL | 1 refills | Status: DC

## 2020-06-29 MED ORDER — HYDROCHLOROTHIAZIDE 25 MG PO TABS: 25.0000 mg | ORAL_TABLET | Freq: Every day | ORAL | 1 refills | Status: DC

## 2020-06-29 MED ORDER — METFORMIN HCL 500 MG PO TABS: 500.0000 mg | ORAL_TABLET | Freq: Two times a day (BID) | ORAL | 1 refills | Status: DC

## 2020-06-29 NOTE — Progress Notes (Signed)
Date:  06/29/2020   Name:  Angela Pope   DOB:  1940/09/20   MRN:  [id number]   Chief Complaint: Diabetes (Foot exam), Hypertension, and Hyperlipidemia  Diabetes She presents for her follow-up diabetic visit. She has type 2 diabetes mellitus. Her disease course has been stable. There are no hypoglycemic associated symptoms. Pertinent negatives for hypoglycemia include no dizziness, headaches, nervousness/anxiousness or sweats. There are no diabetic associated symptoms. Pertinent negatives for diabetes include no blurred vision, no chest pain, no fatigue, no foot paresthesias, no foot ulcerations, no polydipsia, no polyphagia, no polyuria, no visual change, no weakness and no weight loss. There are no hypoglycemic complications. Symptoms are stable. There are no diabetic complications. Pertinent negatives for diabetic complications include no CVA, PVD or retinopathy. Risk factors for coronary artery disease include dyslipidemia and hypertension. Current diabetic treatment includes oral agent (monotherapy). She is compliant with treatment most of the time. She is following a generally healthy diet. Meal planning includes avoidance of concentrated sweets and carbohydrate counting. She participates in exercise daily. Her breakfast blood glucose is taken between 8-9 am. Her breakfast blood glucose range is generally 90-110 mg/dl. An ACE inhibitor/angiotensin II receptor blocker is being taken.  Hypertension This is a chronic problem. The current episode started more than 1 year ago. The problem is controlled. Pertinent negatives include no anxiety, blurred vision, chest pain, headaches, malaise/fatigue, neck pain, orthopnea, palpitations, peripheral edema, PND, shortness of breath or sweats. There are no associated agents to hypertension. Risk factors for coronary artery disease include diabetes mellitus and dyslipidemia. Past treatments include ACE inhibitors and diuretics. There are no compliance  problems.  There is no history of angina, kidney disease, CAD/MI, CVA, heart failure, left ventricular hypertrophy, PVD or retinopathy. There is no history of chronic renal disease or a hypertension causing med.  Hyperlipidemia This is a chronic problem. The current episode started more than 1 year ago. The problem is controlled. Recent lipid tests were reviewed and are normal. She has no history of chronic renal disease, diabetes, hypothyroidism, liver disease, obesity or nephrotic syndrome. Factors aggravating her hyperlipidemia include thiazides. Pertinent negatives include no chest pain, leg pain, myalgias or shortness of breath. Current antihyperlipidemic treatment includes statins. There are no compliance problems.  Risk factors for coronary artery disease include dyslipidemia and hypertension.    Lab Results  Component Value Date   CREATININE 0.82 11/03/2019   BUN 13 11/03/2019   NA 141 11/03/2019   K 3.9 11/03/2019   CL 99 11/03/2019   CO2 28 11/03/2019   Lab Results  Component Value Date   CHOL 146 11/03/2019   HDL 50 11/03/2019   LDLCALC 69 11/03/2019   TRIG 158 (H) 11/03/2019   CHOLHDL 3.4 03/27/2018   Lab Results  Component Value Date   TSH 1.260 03/27/2017   Lab Results  Component Value Date   HGBA1C 5.6 03/19/2020   Lab Results  Component Value Date   WBC 6.2 11/03/2019   HGB 15.8 11/03/2019   HCT 48.6 (H) 11/03/2019   MCV 94 11/03/2019   PLT 306 11/03/2019   Lab Results  Component Value Date   ALT 13 11/03/2019   AST 16 11/03/2019   ALKPHOS 67 11/03/2019   BILITOT 1.8 (H) 11/03/2019     Review of Systems  Constitutional: Negative.  Negative for chills, fatigue, fever, malaise/fatigue, unexpected weight change and weight loss.  HENT: Negative for congestion, ear discharge, ear pain, rhinorrhea, sinus pressure, sneezing and sore throat.  Eyes: Negative for blurred vision, double vision, photophobia, pain, discharge, redness and itching.  Respiratory:  Negative for cough, shortness of breath, wheezing and stridor.   Cardiovascular: Negative for chest pain, palpitations, orthopnea and PND.  Gastrointestinal: Negative for abdominal pain, blood in stool, constipation, diarrhea, nausea and vomiting.  Endocrine: Negative for cold intolerance, heat intolerance, polydipsia, polyphagia and polyuria.  Genitourinary: Negative for dysuria, flank pain, frequency, hematuria, menstrual problem, pelvic pain, urgency, vaginal bleeding and vaginal discharge.  Musculoskeletal: Negative for arthralgias, back pain, myalgias and neck pain.  Skin: Negative for rash.  Allergic/Immunologic: Negative for environmental allergies and food allergies.  Neurological: Negative for dizziness, weakness, light-headedness, numbness and headaches.  Hematological: Negative for adenopathy. Does not bruise/bleed easily.  Psychiatric/Behavioral: Negative for dysphoric mood. The patient is not nervous/anxious.     Patient Active Problem List   Diagnosis Date Noted  . 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 Memory change 01/15/2020  . Osteoarthritis 09/17/2017  . 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 Bilateral lower extremity edema 01/14/2015  . Essential hypertension 01/13/2015  . Diabetes mellitus type 2, controlled, without complications ([place]) [id number]  . Hyperlipidemia 01/13/2015  . Overweight (BMI 25.0-29.9) 01/13/2015  . Allergic rhinitis 01/13/2015  . Hx of colonic polyps 01/13/2015  . 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 Vitamin D deficiency 01/13/2015  . FH: heart disease 01/13/2015  . FH: stroke 01/13/2015  . Incomplete emptying of bladder 07/02/2009  . 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 Sulfa Antibiotics   . Tetracyclines & Related   . Clarithromycin Rash    Past Surgical History:  Procedure Laterality Date  . COLONOSCOPY  2015  . large intestine surgery    . SMALL  INTESTINE SURGERY  2012   polyp that could't be removed    Social History   Tobacco Use  . Smoking status: Former Smoker    Packs/day: 0.25    Years: 10.00    Pack years: 2.50    Types: Cigarettes    Quit date: 10/18/1965    Years since quitting: 54.7  . Smokeless tobacco: Never Used  . Tobacco comment: smoking cessation materials not required  Vaping Use  . Vaping Use: Never used  Substance Use Topics  . Alcohol use: Not Currently    Alcohol/week: 0.0 standard drinks  . Drug use: No     Medication list has been reviewed and updated.  Current Meds  Medication Sig  . Calcium Carb-Cholecalciferol (CALCIUM 1000 + D) 1000-800 MG-UNIT TABS Take 1 tablet by mouth daily.  Marland Kitchen glucose blood test strip Use to check Blood Sugar 3 times daily for ICD10: E11.9(One touch ultra blue brand. )  . hydrochlorothiazide (HYDRODIURIL) 25 MG tablet Take 1 tablet (25 mg total) by mouth daily.  . Lancets (ONETOUCH ULTRASOFT) lancets Use to check Blood Sugar 3 times daily for ICD 10:  E11.9  (Touch Twist Lancets for Ultra Touch)  . loperamide (IMODIUM) 2 MG capsule Take 2 mg by mouth as needed for diarrhea or loose stools.  Marland Kitchen losartan (COZAAR) 100 MG tablet Take 1 tablet (100 mg total) by mouth daily.  . metFORMIN (GLUCOPHAGE) 500 MG tablet Take 1 tablet (500 mg total) by mouth 2 (two) times daily with a meal.  . Multiple Vitamin (MULTIVITAMIN) capsule Take 1 capsule by mouth daily.  . simvastatin (ZOCOR) 40 MG tablet Take 1 tablet (40 mg total) by mouth daily.  Marland Kitchen  vitamin E 180 MG (400 UNITS) capsule Take 400 Units by mouth daily.    PHQ 2/9 Scores 06/29/2020 03/08/2020 01/07/2020 11/03/2019  PHQ - 2 Score 0 0 1 1  PHQ- 9 Score 0 0 - 1    GAD 7 : Generalized Anxiety Score 06/29/2020 03/08/2020 11/03/2019  Nervous, Anxious, on Edge 0 0 1  Control/stop worrying 0 0 0  Worry too much - different things 0 0 0  Trouble relaxing 0 0 0  Restless 0 0 0  Easily annoyed or irritable 0 0 0  Afraid - awful might  happen 0 0 0  Total GAD 7 Score 0 0 1  Anxiety Difficulty - - Not difficult at all    BP Readings from Last 3 Encounters:  06/29/20 114/62  05/06/20 120/74  03/08/20 130/80    Physical Exam Vitals and nursing note reviewed.  Constitutional:      Appearance: She is well-developed and well-nourished.  HENT:     Head: Normocephalic.     Right Ear: Tympanic membrane, ear canal and external ear normal.     Left Ear: Tympanic membrane, ear canal and external ear normal.     Nose: Nose normal.     Mouth/Throat:     Mouth: Oropharynx is clear and moist. Mucous membranes are moist.     Pharynx: No oropharyngeal exudate.  Eyes:     General: Lids are everted, no foreign bodies appreciated. No scleral icterus.       Left eye: No foreign body or hordeolum.     Extraocular Movements: EOM normal.     Conjunctiva/sclera: Conjunctivae normal.     Right eye: Right conjunctiva is not injected.     Left eye: Left conjunctiva is not injected.     Pupils: Pupils are equal, round, and reactive to light.  Neck:     Thyroid: No thyromegaly.     Vascular: No JVD.     Trachea: No tracheal deviation.  Cardiovascular:     Rate and Rhythm: Normal rate and regular rhythm.     Pulses: Intact distal pulses.          Dorsalis pedis pulses are 2+ on the right side and 2+ on the left side.       Posterior tibial pulses are 2+ on the right side and 2+ on the left side.     Heart sounds: Normal heart sounds. No murmur heard. No friction rub. No gallop.   Pulmonary:     Effort: Pulmonary effort is normal. No respiratory distress.     Breath sounds: Normal breath sounds. No wheezing, rhonchi or rales.  Abdominal:     General: Bowel sounds are normal. There is no distension.     Palpations: Abdomen is soft. There is no hepatosplenomegaly or mass.     Tenderness: There is no abdominal tenderness. There is no guarding or rebound.     Hernia: No hernia is present.  Musculoskeletal:        General: No  tenderness or edema. Normal range of motion.     Cervical back: Normal range of motion and neck supple.  Feet:     Right foot:     Protective Sensation: 10 sites tested. 10 sites sensed.     Skin integrity: Skin integrity normal. No ulcer, blister, skin breakdown, erythema, warmth, callus, dry skin or fissure.     Toenail Condition: Right toenails are normal.     Left foot:     Protective Sensation: 10  sites tested. 10 sites sensed.     Skin integrity: Skin integrity normal. No ulcer, blister, skin breakdown, erythema, warmth, callus, dry skin or fissure.     Toenail Condition: Left toenails are normal.  Lymphadenopathy:     Cervical: No cervical adenopathy.  Skin:    General: Skin is warm.     Findings: No rash.  Neurological:     General: No focal deficit present.     Mental Status: She is alert and oriented to person, place, and time.     Cranial Nerves: No cranial nerve deficit.     Deep Tendon Reflexes: Strength normal. Reflexes normal.  Psychiatric:        Mood and Affect: Mood and affect and mood normal. Mood is not anxious or depressed.     Wt Readings from Last 3 Encounters:  06/29/20 137 lb (62.1 kg)  05/06/20 140 lb (63.5 kg)  03/08/20 144 lb (65.3 kg)    BP 114/62   Pulse 64   Ht 5\' 5"  (1.651 m)   Wt 137 lb (62.1 kg)   BMI 22.80 kg/m   Assessment and Plan:  1. Essential hypertension Chronic.  Controlled.  Stable.  Blood pressure today is 114/62.  Continue hydrochlorothiazide 25 mg once a day.  And losartan 100 mg once a day.  Will check CMP for electrolytes and GFR. - hydrochlorothiazide (HYDRODIURIL) 25 MG tablet; Take 1 tablet (25 mg total) by mouth daily.  Dispense: 90 tablet; Refill: 1 - losartan (COZAAR) 100 MG tablet; Take 1 tablet (100 mg total) by mouth daily.  Dispense: 90 tablet; Refill: 1 - Comprehensive Metabolic Panel (CMET)  2. Controlled type 2 diabetes mellitus without complication, without long-term current use of insulin (HCC) Chronic.   Controlled.  Stable.  Review of last A1c was 5.6 patient would like to consider coming off Metformin 500 mg twice a day.  So that we will look at her A1c and make a determination thereof.  Foot exam was done and it was normal with 2+ dorsalis pedis posterior tibial pulses.  We will check A1c for current level - metFORMIN (GLUCOPHAGE) 500 MG tablet; Take 1 tablet (500 mg total) by mouth 2 (two) times daily with a meal.  Dispense: 180 tablet; Refill: 1 - Comprehensive Metabolic Panel (CMET) - HgB A1c  3. Mixed hyperlipidemia Chronic.  Controlled.  Stable.  Continue simvastatin 40 mg once a day.  Will check lipid panel for current LDL status. - simvastatin (ZOCOR) 40 MG tablet; Take 1 tablet (40 mg total) by mouth daily.  Dispense: 90 tablet; Refill: 1 - Lipid Panel With LDL/HDL Ratio

## 2020-06-30 LAB — COMPREHENSIVE METABOLIC PANEL
ALT: 12 IU/L (ref 0–32)
AST: 17 IU/L (ref 0–40)
Albumin/Globulin Ratio: 1.9 (ref 1.2–2.2)
Albumin: 4.2 g/dL (ref 3.7–4.7)
Alkaline Phosphatase: 56 IU/L (ref 44–121)
BUN/Creatinine Ratio: 12 (ref 12–28)
BUN: 9 mg/dL (ref 8–27)
Bilirubin Total: 1.8 mg/dL — ABNORMAL HIGH (ref 0.0–1.2)
CO2: 27 mmol/L (ref 20–29)
Calcium: 9.8 mg/dL (ref 8.7–10.3)
Chloride: 103 mmol/L (ref 96–106)
Creatinine, Ser: 0.74 mg/dL (ref 0.57–1.00)
GFR calc Af Amer: 89 mL/min/{1.73_m2} (ref >59)
GFR calc non Af Amer: 77 mL/min/{1.73_m2} (ref >59)
Globulin, Total: 2.2 g/dL (ref 1.5–4.5)
Glucose: 119 mg/dL — ABNORMAL HIGH (ref 65–99)
Potassium: 4.4 mmol/L (ref 3.5–5.2)
Sodium: 144 mmol/L (ref 134–144)
Total Protein: 6.4 g/dL (ref 6.0–8.5)

## 2020-06-30 LAB — LIPID PANEL WITH LDL/HDL RATIO
Cholesterol, Total: 145 mg/dL (ref 100–199)
HDL: 57 mg/dL (ref >39)
LDL Chol Calc (NIH): 71 mg/dL (ref 0–99)
LDL/HDL Ratio: 1.2 ratio (ref 0.0–3.2)
Triglycerides: 90 mg/dL (ref 0–149)
VLDL Cholesterol Cal: 17 mg/dL (ref 5–40)

## 2020-06-30 LAB — HEMOGLOBIN A1C
Est. average glucose Bld gHb Est-mCnc: 111 mg/dL
Hgb A1c MFr Bld: 5.5 % (ref 4.8–5.6)

## 2020-09-09 DIAGNOSIS — Z23 Encounter for immunization: Secondary | ICD-10-CM | POA: Diagnosis not present

## 2020-09-23 ENCOUNTER — Other Ambulatory Visit: Payer: Self-pay | Admitting: Family Medicine

## 2020-09-23 DIAGNOSIS — E119 Type 2 diabetes mellitus without complications: Secondary | ICD-10-CM

## 2020-09-23 MED ORDER — GLUCOSE BLOOD VI STRP: ORAL_STRIP | 3 refills | Status: DC

## 2020-09-23 NOTE — Telephone Encounter (Signed)
Medication Refill - Medication: one touch ultra test strip.  Has the patient contacted their pharmacy? Yes was told to call her md office  (Preferred Pharmacy (with phone number or street name):[facility] in [place] phone number [phone] Agent: Please be advised that RX refills may take up to 3 business days. We ask that you follow-up with your pharmacy.

## 2020-09-23 NOTE — Telephone Encounter (Signed)
Requested Prescriptions  Pending Prescriptions Disp Refills  . glucose blood test strip 100 each 3    Sig: Use to check Blood Sugar 3 times daily for ICD10: E11.9(One touch ultra blue brand. )     Endocrinology: Diabetes - Testing Supplies Passed - 09/23/2020  2:56 PM      Passed - Valid encounter within last 12 months    Recent Outpatient Visits          2 months ago Essential hypertension   [place], [place]   4 months ago Controlled type 2 diabetes mellitus without complication, without long-term current use of insulin ([place])   [facility] Juline Patch, MD   6 months ago Controlled type 2 diabetes mellitus without complication, without long-term current use of insulin ([place])   [facility] Juline Patch, MD   8 months ago Essential hypertension   [place], [place]   10 months ago Diabetes mellitus, new onset [facility])   [facility] Juline Patch, MD      Future Appointments            In 2 weeks Juline Patch, MD [facility], [facility]

## 2020-10-11 ENCOUNTER — Ambulatory Visit (INDEPENDENT_AMBULATORY_CARE_PROVIDER_SITE_OTHER): Payer: Medicare Other | Admitting: Family Medicine

## 2020-10-11 ENCOUNTER — Encounter: Payer: Self-pay | Admitting: Family Medicine

## 2020-10-11 ENCOUNTER — Other Ambulatory Visit: Payer: Self-pay

## 2020-10-11 VITALS — BP 120/80 | HR 80 | Ht 65.0 in | Wt 132.0 lb

## 2020-10-11 DIAGNOSIS — E119 Type 2 diabetes mellitus without complications: Secondary | ICD-10-CM

## 2020-10-11 MED ORDER — METFORMIN HCL 500 MG PO TABS: 500.0000 mg | ORAL_TABLET | Freq: Two times a day (BID) | ORAL | 1 refills | Status: DC

## 2020-10-11 NOTE — Progress Notes (Signed)
Date:  10/11/2020   Name:  Angela Pope   DOB:  09-12-1940   MRN:  [id number]   Chief Complaint: Diabetes (Foot exam)  Diabetes She presents for her follow-up diabetic visit. She has type 2 diabetes mellitus. Her disease course has been stable. There are no hypoglycemic associated symptoms. Pertinent negatives for hypoglycemia include no dizziness, headaches or nervousness/anxiousness. There are no diabetic associated symptoms. Pertinent negatives for diabetes include no fatigue, no polydipsia, no polyphagia, no polyuria and no weakness. There are no hypoglycemic complications. Symptoms are stable. There are no diabetic complications. There are no known risk factors for coronary artery disease.    Lab Results  Component Value Date   CREATININE 0.74 06/29/2020   BUN 9 06/29/2020   NA 144 06/29/2020   K 4.4 06/29/2020   CL 103 06/29/2020   CO2 27 06/29/2020   Lab Results  Component Value Date   CHOL 145 06/29/2020   HDL 57 06/29/2020   LDLCALC 71 06/29/2020   TRIG 90 06/29/2020   CHOLHDL 3.4 03/27/2018   Lab Results  Component Value Date   TSH 1.260 03/27/2017   Lab Results  Component Value Date   HGBA1C 5.5 06/29/2020   Lab Results  Component Value Date   WBC 6.2 11/03/2019   HGB 15.8 11/03/2019   HCT 48.6 (H) 11/03/2019   MCV 94 11/03/2019   PLT 306 11/03/2019   Lab Results  Component Value Date   ALT 12 06/29/2020   AST 17 06/29/2020   ALKPHOS 56 06/29/2020   BILITOT 1.8 (H) 06/29/2020     Review of Systems  Constitutional: Negative.  Negative for chills, fatigue, fever and unexpected weight change.  HENT: Negative for congestion, ear discharge, ear pain, rhinorrhea, sinus pressure, sneezing and sore throat.   Eyes: Negative for photophobia, pain, discharge, redness and itching.  Respiratory: Negative for cough, shortness of breath, wheezing and stridor.   Gastrointestinal: Negative for abdominal pain, blood in stool, constipation, diarrhea, nausea and  vomiting.  Endocrine: Negative for cold intolerance, heat intolerance, polydipsia, polyphagia and polyuria.  Genitourinary: Negative for dysuria, flank pain, frequency, hematuria, menstrual problem, pelvic pain, urgency, vaginal bleeding and vaginal discharge.  Musculoskeletal: Negative for arthralgias, back pain and myalgias.  Skin: Negative for rash.  Allergic/Immunologic: Negative for environmental allergies and food allergies.  Neurological: Negative for dizziness, weakness, light-headedness, numbness and headaches.  Hematological: Negative for adenopathy. Does not bruise/bleed easily.  Psychiatric/Behavioral: Negative for dysphoric mood. The patient is not nervous/anxious.     Patient Active Problem List   Diagnosis Date Noted  . 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 Memory change 01/15/2020  . Osteoarthritis 09/17/2017  . 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 Bilateral lower extremity edema 01/14/2015  . Essential hypertension 01/13/2015  . Diabetes mellitus type 2, controlled, without complications ([place]) 47/42/5956  . Hyperlipidemia 01/13/2015  . Overweight (BMI 25.0-29.9) 01/13/2015  . Allergic rhinitis 01/13/2015  . Hx of colonic polyps 01/13/2015  . 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 Vitamin D deficiency 01/13/2015  . FH: heart disease 01/13/2015  . FH: stroke 01/13/2015  . Incomplete emptying of bladder 07/02/2009  . 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 Sulfa Antibiotics   . Tetracyclines & Related   . Clarithromycin Rash    Past Surgical History:  Procedure Laterality Date  . COLONOSCOPY  2015  . large intestine surgery    .  SMALL INTESTINE SURGERY  2012   polyp that could't be removed    Social History   Tobacco Use  . Smoking status: Former Smoker    Packs/day: 0.25    Years: 10.00    Pack years: 2.50    Types: Cigarettes    Quit date: 10/18/1965    Years since  quitting: 55.0  . Smokeless tobacco: Never Used  . Tobacco comment: smoking cessation materials not required  Vaping Use  . Vaping Use: Never used  Substance Use Topics  . Alcohol use: Not Currently    Alcohol/week: 0.0 standard drinks  . Drug use: No     Medication list has been reviewed and updated.  Current Meds  Medication Sig  . Calcium Carb-Cholecalciferol (CALCIUM 1000 + D) 1000-800 MG-UNIT TABS Take 1 tablet by mouth daily.  Marland Kitchen glucose blood test strip Use to check Blood Sugar 3 times daily for ICD10: E11.9(One touch ultra blue brand. )  . hydrochlorothiazide (HYDRODIURIL) 25 MG tablet Take 1 tablet (25 mg total) by mouth daily.  . Lancets (ONETOUCH ULTRASOFT) lancets Use to check Blood Sugar 3 times daily for ICD 10:  E11.9  (Touch Twist Lancets for Ultra Touch)  . loperamide (IMODIUM) 2 MG capsule Take 2 mg by mouth as needed for diarrhea or loose stools.  Marland Kitchen losartan (COZAAR) 100 MG tablet Take 1 tablet (100 mg total) by mouth daily.  . metFORMIN (GLUCOPHAGE) 500 MG tablet Take 1 tablet (500 mg total) by mouth 2 (two) times daily with a meal.  . Multiple Vitamin (MULTIVITAMIN) capsule Take 1 capsule by mouth daily.  . simvastatin (ZOCOR) 40 MG tablet Take 1 tablet (40 mg total) by mouth daily.  . vitamin E 180 MG (400 UNITS) capsule Take 400 Units by mouth daily.    PHQ 2/9 Scores 10/11/2020 06/29/2020 03/08/2020 01/07/2020  PHQ - 2 Score 0 0 0 1  PHQ- 9 Score 0 0 0 -    GAD 7 : Generalized Anxiety Score 10/11/2020 06/29/2020 03/08/2020 11/03/2019  Nervous, Anxious, on Edge 0 0 0 1  Control/stop worrying 0 0 0 0  Worry too much - different things 0 0 0 0  Trouble relaxing 0 0 0 0  Restless 0 0 0 0  Easily annoyed or irritable 0 0 0 0  Afraid - awful might happen 0 0 0 0  Total GAD 7 Score 0 0 0 1  Anxiety Difficulty - - - Not difficult at all    BP Readings from Last 3 Encounters:  10/11/20 120/80  06/29/20 114/62  05/06/20 120/74    Physical Exam Vitals and  nursing note reviewed.  Constitutional:      Appearance: She is well-developed.  HENT:     Head: Normocephalic.     Right Ear: Tympanic membrane, ear canal and external ear normal.     Left Ear: Tympanic membrane, ear canal and external ear normal.     Nose: Nose normal.     Mouth/Throat:     Mouth: Mucous membranes are moist.  Eyes:     General: Lids are everted, no foreign bodies appreciated. No scleral icterus.       Left eye: No foreign body or hordeolum.     Extraocular Movements: Extraocular movements intact.     Conjunctiva/sclera: Conjunctivae normal.     Right eye: Right conjunctiva is not injected.     Left eye: Left conjunctiva is not injected.     Pupils: Pupils are equal, round, and reactive  to light.  Neck:     Thyroid: No thyromegaly.     Vascular: No JVD.     Trachea: No tracheal deviation.  Cardiovascular:     Rate and Rhythm: Normal rate and regular rhythm.     Pulses:          Dorsalis pedis pulses are 2+ on the right side and 2+ on the left side.       Posterior tibial pulses are 2+ on the right side and 2+ on the left side.     Heart sounds: Normal heart sounds. No murmur heard. No friction rub. No gallop.   Pulmonary:     Effort: Pulmonary effort is normal. No respiratory distress.     Breath sounds: Normal breath sounds. No wheezing, rhonchi or rales.  Abdominal:     General: Bowel sounds are normal.     Palpations: Abdomen is soft. There is no mass.     Tenderness: There is no abdominal tenderness. There is no guarding or rebound.  Musculoskeletal:        General: No tenderness. Normal range of motion.     Cervical back: Normal range of motion and neck supple.     Right foot: Normal range of motion. No deformity, bunion, Charcot foot, foot drop or prominent metatarsal heads.     Left foot: Normal range of motion. Deformity present. No bunion, Charcot foot, foot drop or prominent metatarsal heads.  Feet:     Right foot:     Protective Sensation: 10  sites tested. 10 sites sensed.     Skin integrity: Skin integrity normal. No ulcer, blister, skin breakdown, erythema, warmth, callus, dry skin or fissure.     Toenail Condition: Right toenails are normal.     Left foot:     Protective Sensation: 10 sites tested. 10 sites sensed.     Skin integrity: Skin integrity normal. No ulcer, blister, skin breakdown, erythema, warmth, callus, dry skin or fissure.     Toenail Condition: Left toenails are normal.  Lymphadenopathy:     Cervical: No cervical adenopathy.  Skin:    General: Skin is warm.     Findings: No rash.  Neurological:     Mental Status: She is alert and oriented to person, place, and time.     Cranial Nerves: No cranial nerve deficit.     Deep Tendon Reflexes: Reflexes normal.  Psychiatric:        Mood and Affect: Mood is not anxious or depressed.     Wt Readings from Last 3 Encounters:  10/11/20 132 lb (59.9 kg)  06/29/20 137 lb (62.1 kg)  05/06/20 140 lb (63.5 kg)    BP 120/80   Pulse 80   Ht 5\' 5"  (1.651 m)   Wt 132 lb (59.9 kg)   BMI 21.97 kg/m   Assessment and Plan:  1. Controlled type 2 diabetes mellitus without complication, without long-term current use of insulin (HCC) Chronic.  Controlled.  Stable.  We will continue metformin 500 mg twice a day.  Patient is doing a Job with weight control.  We will check an A1c today to see current status of control. - HgB A1c - metFORMIN (GLUCOPHAGE) 500 MG tablet; Take 1 tablet (500 mg total) by mouth 2 (two) times daily with a meal.  Dispense: 180 tablet; Refill: 1

## 2020-10-21 DIAGNOSIS — E119 Type 2 diabetes mellitus without complications: Secondary | ICD-10-CM | POA: Diagnosis not present

## 2020-10-22 LAB — HEMOGLOBIN A1C
Est. average glucose Bld gHb Est-mCnc: 111 mg/dL
Hgb A1c MFr Bld: 5.5 % (ref 4.8–5.6)

## 2021-01-07 ENCOUNTER — Telehealth: Payer: Self-pay

## 2021-01-07 NOTE — Telephone Encounter (Signed)
Copied from [place] [id number]. Topic: General - Other >> Jan 07, 2021  4:22 PM Celene Kras wrote: Reason for CRM: Pts daughter, Angela Pope, calling on behalf of pt stating that the pts memory is continuing to get worse with needing lots of reminders, having to write things down, short term memory loss. She states that the pt will minimize symptoms due to embarrassment and wants to let PCP know this before appt on Monday. Please advise.

## 2021-01-10 ENCOUNTER — Ambulatory Visit (INDEPENDENT_AMBULATORY_CARE_PROVIDER_SITE_OTHER): Payer: Medicare Other

## 2021-01-10 DIAGNOSIS — Z0000 Encounter for general adult medical examination without abnormal findings: Secondary | ICD-10-CM | POA: Diagnosis not present

## 2021-01-10 NOTE — Patient Instructions (Signed)
Ms. Shearn , Thank you for taking time to come for your Medicare Wellness Visit. I appreciate your ongoing commitment to your health goals. Please review the following plan we discussed and let me know if I can assist you in the future.   Screening recommendations/referrals: Colonoscopy: done 07/31/13. Please contact [facility] to reschedule your colonoscopy at [phone] Mammogram: done 04/14/19.  Bone Density: done 04/14/19 Recommended yearly ophthalmology/optometry visit for glaucoma screening and checkup Recommended yearly dental visit for hygiene and checkup  Vaccinations: Influenza vaccine: done 03/08/20 Pneumococcal vaccine: done 2012 Tdap vaccine: due Shingles vaccine: Shingrix discussed. Please contact your pharmacy for coverage information.  Covid-19:done 06/29/19, 07/21/19, 03/10/20 & 09/09/20  Conditions/risks identified: Recommend increasing physical activity to at least 3 days per week   Next appointment: Follow up in one year for your annual wellness visit    Preventive Care 65 Years and Older, Female Preventive care refers to lifestyle choices and visits with your health care provider that can promote health and wellness. What does preventive care include? A yearly physical exam. This is also called an annual well check. Dental exams once or twice a year. Routine eye exams. Ask your health care provider how often you should have your eyes checked. Personal lifestyle choices, including: Daily care of your teeth and gums. Regular physical activity. Eating a healthy diet. Avoiding tobacco and drug use. Limiting alcohol use. Practicing safe sex. Taking low-dose aspirin every day. Taking vitamin and mineral supplements as recommended by your health care provider. What happens during an annual well check? The services and screenings done by your health care provider during your annual well check will depend on your age, overall health, lifestyle risk  factors, and family history of disease. Counseling  Your health care provider may ask you questions about your: Alcohol use. Tobacco use. Drug use. Emotional well-being. Home and relationship well-being. Sexual activity. Eating habits. History of falls. Memory and ability to understand (cognition). Work and work Statistician. Reproductive health. Screening  You may have the following tests or measurements: Height, weight, and BMI. Blood pressure. Lipid and cholesterol levels. These may be checked every 5 years, or more frequently if you are over 89 years old. Skin check. Lung cancer screening. You may have this screening every year starting at age 68 if you have a 30-pack-year history of smoking and currently smoke or have quit within the past 15 years. Fecal occult blood test (FOBT) of the stool. You may have this test every year starting at age 51. Flexible sigmoidoscopy or colonoscopy. You may have a sigmoidoscopy every 5 years or a colonoscopy every 10 years starting at age 80. Hepatitis C blood test. Hepatitis B blood test. Sexually transmitted disease (STD) testing. Diabetes screening. This is done by checking your blood sugar (glucose) after you have not eaten for a while (fasting). You may have this done every 1-3 years. Bone density scan. This is done to screen for osteoporosis. You may have this done starting at age 76. Mammogram. This may be done every 1-2 years. Talk to your health care provider about how often you should have regular mammograms. Talk with your health care provider about your test results, treatment options, and if necessary, the need for more tests. Vaccines  Your health care provider may recommend certain vaccines, such as: Influenza vaccine. This is recommended every year. Tetanus, diphtheria, and acellular pertussis (Tdap, Td) vaccine. You may need a Td booster every 10 years. Zoster vaccine. You may need this after age 88.  Pneumococcal 13-valent  conjugate (PCV13) vaccine. One dose is recommended after age 62. Pneumococcal polysaccharide (PPSV23) vaccine. One dose is recommended after age 78. Talk to your health care provider about which screenings and vaccines you need and how often you need them. This information is not intended to replace advice given to you by your health care provider. Make sure you discuss any questions you have with your health care provider. Document Released: 06/11/2015 Document Revised: 02/02/2016 Document Reviewed: 03/16/2015 Elsevier Interactive Patient Education  2017 [place] Prevention in the Home Falls can cause injuries. They can happen to people of all ages. There are many things you can do to make your home safe and to help prevent falls. What can I do on the outside of my home? Regularly fix the edges of walkways and driveways and fix any cracks. Remove anything that might make you trip as you walk through a door, such as a raised step or threshold. Trim any bushes or trees on the path to your home. Use bright outdoor lighting. Clear any walking paths of anything that might make someone trip, such as rocks or tools. Regularly check to see if handrails are loose or broken. Make sure that both sides of any steps have handrails. Any raised decks and porches should have guardrails on the edges. Have any leaves, snow, or ice cleared regularly. Use sand or salt on walking paths during winter. Clean up any spills in your garage right away. This includes oil or grease spills. What can I do in the bathroom? Use night lights. Install grab bars by the toilet and in the tub and shower. Do not use towel bars as grab bars. Use non-skid mats or decals in the tub or shower. If you need to sit down in the shower, use a plastic, non-slip stool. Keep the floor dry. Clean up any water that spills on the floor as soon as it happens. Remove soap buildup in the tub or shower regularly. Attach bath mats  securely with double-sided non-slip rug tape. Do not have throw rugs and other things on the floor that can make you trip. What can I do in the bedroom? Use night lights. Make sure that you have a light by your bed that is easy to reach. Do not use any sheets or blankets that are too big for your bed. They should not hang down onto the floor. Have a firm chair that has side arms. You can use this for support while you get dressed. Do not have throw rugs and other things on the floor that can make you trip. What can I do in the kitchen? Clean up any spills right away. Avoid walking on wet floors. Keep items that you use a lot in easy-to-reach places. If you need to reach something above you, use a strong step stool that has a grab bar. Keep electrical cords out of the way. Do not use floor polish or wax that makes floors slippery. If you must use wax, use non-skid floor wax. Do not have throw rugs and other things on the floor that can make you trip. What can I do with my stairs? Do not leave any items on the stairs. Make sure that there are handrails on both sides of the stairs and use them. Fix handrails that are broken or loose. Make sure that handrails are as long as the stairways. Check any carpeting to make sure that it is firmly attached to the stairs. Fix any  carpet that is loose or worn. Avoid having throw rugs at the top or bottom of the stairs. If you do have throw rugs, attach them to the floor with carpet tape. Make sure that you have a light switch at the top of the stairs and the bottom of the stairs. If you do not have them, ask someone to add them for you. What else can I do to help prevent falls? Wear shoes that: Do not have high heels. Have rubber bottoms. Are comfortable and fit you well. Are closed at the toe. Do not wear sandals. If you use a stepladder: Make sure that it is fully opened. Do not climb a closed stepladder. Make sure that both sides of the stepladder  are locked into place. Ask someone to hold it for you, if possible. Clearly mark and make sure that you can see: Any grab bars or handrails. First and last steps. Where the edge of each step is. Use tools that help you move around (mobility aids) if they are needed. These include: Canes. Walkers. Scooters. Crutches. Turn on the lights when you go into a dark area. Replace any light bulbs as soon as they burn out. Set up your furniture so you have a clear path. Avoid moving your furniture around. If any of your floors are uneven, fix them. If there are any pets around you, be aware of where they are. Review your medicines with your doctor. Some medicines can make you feel dizzy. This can increase your chance of falling. Ask your doctor what other things that you can do to help prevent falls. This information is not intended to replace advice given to you by your health care provider. Make sure you discuss any questions you have with your health care provider. Document Released: 03/11/2009 Document Revised: 10/21/2015 Document Reviewed: 06/19/2014 Elsevier Interactive Patient Education  2017 [employer].

## 2021-01-10 NOTE — Progress Notes (Signed)
Subjective:   Angela Pope is a 80 y.o. female who presents for Medicare Annual (Subsequent) preventive examination.  Virtual Visit via Telephone Note  I connected with  Angela Pope on 01/10/21 at  8:00 AM EDT by telephone and verified that I am speaking with the correct person using two identifiers.  Location: Patient: home Provider: [facility] Persons participating in the virtual visit: [place]   I discussed the limitations, risks, security and privacy concerns of performing an evaluation and management service by telephone and the availability of in person appointments. The patient expressed understanding and agreed to proceed.  Interactive audio and video telecommunications were attempted between this nurse and patient, however failed, due to patient having technical difficulties OR patient did not have access to video capability.  We continued and completed visit with audio only.  Some vital signs may be absent or patient reported.   Clemetine Marker, LPN   Review of Systems     Cardiac Risk Factors include: advanced age (>5mn, >>10women);diabetes mellitus;dyslipidemia;hypertension     Objective:    There were no vitals filed for this visit. There is no height or weight on file to calculate BMI.  Advanced Directives 01/10/2021 01/07/2020 01/06/2019 10/31/2017 03/15/2017 10/30/2016 01/13/2015  Does Patient Have a Medical Advance Directive? Yes Yes Yes Yes No Yes Yes  Type of AParamedicof [place]ark[place]Living will [place]Living will H[place]izavilleLiving will [place]ipp[place]Living will - [place]Living will Living will  Copy of [place] Chart? Yes - validated most recent copy scanned in chart (See row information) No - copy requested No - copy requested No - copy requested - No - copy requested -    Current Medications (verified) Outpatient Encounter  Medications as of 01/10/2021  Medication Sig   Calcium Carb-Cholecalciferol (CALCIUM 1000 + D) 1000-800 MG-UNIT TABS Take 1 tablet by mouth daily.   glucose blood test strip Use to check Blood Sugar 3 times daily for ICD10: E11.9(One touch ultra blue brand. )   hydrochlorothiazide (HYDRODIURIL) 25 MG tablet Take 1 tablet (25 mg total) by mouth daily.   Lancets (ONETOUCH ULTRASOFT) lancets Use to check Blood Sugar 3 times daily for ICD 10:  E11.9  (Touch Twist Lancets for Ultra Touch)   loperamide (IMODIUM) 2 MG capsule Take 2 mg by mouth as needed for diarrhea or loose stools.   losartan (COZAAR) 100 MG tablet Take 1 tablet (100 mg total) by mouth daily.   metFORMIN (GLUCOPHAGE) 500 MG tablet Take 1 tablet (500 mg total) by mouth 2 (two) times daily with a meal.   Multiple Vitamin (MULTIVITAMIN) capsule Take 1 capsule by mouth daily.   simvastatin (ZOCOR) 40 MG tablet Take 1 tablet (40 mg total) by mouth daily.   vitamin E 180 MG (400 UNITS) capsule Take 400 Units by mouth daily.   No facility-administered encounter medications on file as of 01/10/2021.    Allergies (verified) Sulfa antibiotics, Tetracyclines & related, and Clarithromycin   History: Past Medical History:  Diagnosis Date   Diabetes mellitus without complication (HHalma    controls with diet   Family history of adverse reaction to anesthesia    Daughter and son have problems (like their father) with Succinylcholine   Hyperlipidemia    Hypertension    Past Surgical History:  Procedure Laterality Date   COLONOSCOPY  2015   large intestine surgery     SMALL INTESTINE SURGERY  2012   polyp that could't be removed   Family History  Problem Relation Age of Onset   Heart disease Mother    Hypertension Mother    Atrial fibrillation Mother    Diabetes Brother    Heart disease Brother    Hypertension Brother    Diabetes Paternal Grandfather    Stroke Father    Prostate cancer Brother    Hypertension Brother    Healthy  Daughter    Diabetes Son    Breast cancer Neg Hx    Social History   Socioeconomic History   Marital status: Divorced    Spouse name: Not on file   Number of children: 2   Years of education: [employer]   Highest education level: 12th grade  Occupational History   Occupation: Retired  Tobacco Use   Smoking status: Former    Packs/day: 0.25    Years: 10.00    Pack years: 2.50    Types: Cigarettes    Quit date: 10/18/1965    Years since quitting: 55.2   Smokeless tobacco: Never   Tobacco comments:    smoking cessation materials not required  Vaping Use   Vaping Use: Never used  Substance and Sexual Activity   Alcohol use: Not Currently    Alcohol/week: 0.0 standard drinks   Drug use: No   Sexual activity: Never  Other Topics Concern   Not on file  Social History Narrative   Pt lives alone; daughter close by   Social Determinants of Health   Financial Resource Strain: Low Risk    Difficulty of Paying Living Expenses: Not hard at all  Food Insecurity: No Food Insecurity   Worried About Charity fundraiser in the Last Year: Never true   Ran Out of Food in the Last Year: Never true  Transportation Needs: No Transportation Needs   Lack of Transportation (Medical): No   Lack of Transportation (Non-Medical): No  Physical Activity: Inactive   Days of Exercise per Week: 0 days   Minutes of Exercise per Session: 0 min  Stress: Stress Concern Present   Feeling of Stress : To some extent  Social Connections: Socially Isolated   Frequency of Communication with Friends and Family: More than three times a week   Frequency of Social Gatherings with Friends and Family: More than three times a week   Attends Religious Services: Never   Marine scientist or Organizations: No   Attends Music therapist: Never   Marital Status: Divorced    Tobacco Counseling Counseling given: Not Answered Tobacco comments: smoking cessation materials not  required   Clinical Intake:  Pre-visit preparation completed: Yes  Pain : No/denies pain     Nutritional Risks: Nausea/ vomitting/ diarrhea (intermittent diarrhea) Diabetes: Yes CBG done?: No Did pt. bring in CBG monitor from home?: No  How often do you need to have someone help you when you read instructions, pamphlets, or other written materials from your doctor or pharmacy?: 1 - Never  Nutrition Risk Assessment:  Has the patient had any N/V/D within the last 2 months?  Yes  - diarrhea; plans to follow up with gastroenterology Does the patient have any non-healing wounds?  No  Has the patient had any unintentional weight loss or weight gain?  No   Diabetes:  Is the patient diabetic?  Yes  If diabetic, was a CBG obtained today?  No  Did the patient bring in their glucometer from home?  No  How often do you monitor your CBG's? 3 times per week .   Financial Strains and Diabetes Management:  Are you having any financial strains with the device, your supplies or your medication? No .  Does the patient want to be seen by Chronic Care Management for management of their diabetes?  No  Would the patient like to be referred to a Nutritionist or for Diabetic Management?  No   Diabetic Exams:  Diabetic Eye Exam: Completed 06/01/20 negative retinopathy.   Diabetic Foot Exam: Completed 10/11/20.   Interpreter Needed?: No  Information entered by :: Clemetine Marker LPN   Activities of Daily Living In your present state of health, do you have any difficulty performing the following activities: 01/10/2021  Hearing? N  Vision? N  Difficulty concentrating or making decisions? N  Walking or climbing stairs? N  Dressing or bathing? N  Doing errands, shopping? N  Preparing Food and eating ? N  Using the Toilet? N  In the past six months, have you accidently leaked urine? N  Do you have problems with loss of bowel control? N  Managing your Medications? N  Managing your Finances? N   Housekeeping or managing your Housekeeping? N  Some recent data might be hidden    Patient Care Team: Juline Patch, MD as PCP - General (Family Medicine)  Indicate any recent Medical Services you may have received from other than [facility] providers in the past year (date may be approximate).     Assessment:   This is a routine wellness examination for Angela Pope.  Hearing/Vision screen Hearing Screening - Comments:: Pt denies hearing difficulty Vision Screening - Comments:: [facility]  Dietary issues and exercise activities discussed: Current Exercise Habits: The patient does not participate in regular exercise at present, Exercise limited by: None identified   Goals Addressed             This Visit's Progress    DIET - INCREASE WATER INTAKE   On track    Recommend to drink at least 6-8 8oz glasses of water per day.       Depression Screen PHQ 2/9 Scores 01/10/2021 10/11/2020 06/29/2020 03/08/2020 01/07/2020 11/03/2019 03/18/2019  PHQ - 2 Score 0 0 0 0 '1 1 2  '$ PHQ- 9 Score - 0 0 0 - 1 7    Fall Risk Fall Risk  01/10/2021 06/29/2020 03/08/2020 01/07/2020 11/03/2019  Falls in the past year? 0 0 0 0 0  Comment - - - - -  Number falls in past yr: 0 - - 0 -  Comment - - - - -  Injury with Fall? 0 - - 0 -  Risk for fall due to : No Fall Risks - - No Fall Risks -  Risk for fall due to: Comment - - - - -  Follow up Falls prevention discussed Falls evaluation completed Falls evaluation completed Falls prevention discussed Falls evaluation completed    FALL RISK PREVENTION PERTAINING TO THE HOME:  Any stairs in or around the home? No  If so, are there any without handrails? No  Home free of loose throw rugs in walkways, pet beds, electrical cords, etc? Yes  Adequate lighting in your home to reduce risk of falls? Yes   ASSISTIVE DEVICES UTILIZED TO PREVENT FALLS:  Life alert? No  Use of a cane, walker or w/c? No  Grab bars in the bathroom? Yes   Shower chair or bench in shower? Yes  Elevated toilet seat or a handicapped toilet? Yes   TIMED UP AND GO:  Was the test performed? No . Telephonic visit.   Cognitive Function: Normal cognitive status assessed by direct observation by this Nurse Health Advisor. No abnormalities found.       6CIT Screen 05/06/2020 01/08/2020 01/07/2020 01/06/2019 10/31/2017  What Year? 0 points 0 points 0 points 0 points 4 points  What month? 0 points 0 points 0 points 0 points 0 points  What time? 0 points 0 points 0 points 0 points 0 points  Count back from 20 0 points 0 points 0 points 0 points 0 points  Months in reverse 0 points 0 points 0 points 0 points 0 points  Repeat phrase 0 points 0 points 0 points 0 points 0 points  Total Score 0 0 0 0 4    Immunizations Immunization History  Administered Date(s) Administered   Fluad Quad(high Dose 65+) 02/05/2019, 03/08/2020   Influenza, High Dose Seasonal PF 03/18/2018   Influenza,inj,Quad PF,6+ Mos 03/10/2015, 03/21/2016, 03/15/2017   PFIZER Comirnaty(Gray Top)Covid-19 Tri-Sucrose Vaccine 09/09/2020   PFIZER(Purple Top)SARS-COV-2 Vaccination 06/29/2019, 07/21/2019, 03/10/2020   Pneumococcal Conjugate-13 05/29/2009   Pneumococcal Polysaccharide-23 05/29/2010   Td 05/29/2010    TDAP status: Due, Education has been provided regarding the importance of this vaccine. Advised may receive this vaccine at local pharmacy or Health Dept. Aware to provide a copy of the vaccination record if obtained from local pharmacy or Health Dept. Verbalized acceptance and understanding.  Flu Vaccine status: Up to date  Pneumococcal vaccine status: Up to date  Covid-19 vaccine status: Information provided on how to obtain vaccines.   Qualifies for Shingles Vaccine? Yes   Zostavax completed No   Shingrix Completed?: No.    Education has been provided regarding the importance of this vaccine. Patient has been advised to call insurance company to determine out of pocket  expense if they have not yet received this vaccine. Advised may also receive vaccine at local pharmacy or Health Dept. Verbalized acceptance and understanding.  Screening Tests Health Maintenance  Topic Date Due   Zoster Vaccines- Shingrix (1 of 2) Never done   INFLUENZA VACCINE  12/27/2020   Hepatitis C Screening  03/08/2021 (Originally 03/11/1959)   MAMMOGRAM  06/29/2021 (Originally 04/13/2020)   TETANUS/TDAP  06/29/2021 (Originally 05/29/2020)   OPHTHALMOLOGY EXAM  03/29/2021   HEMOGLOBIN A1C  04/23/2021   FOOT EXAM  10/11/2021   DEXA SCAN  Completed   COVID-19 Vaccine  Completed   PNA vac Low Risk Adult  Completed   HPV VACCINES  Aged Out    Health Maintenance  Health Maintenance Due  Topic Date Due   Zoster Vaccines- Shingrix (1 of 2) Never done   INFLUENZA VACCINE  12/27/2020    Colorectal cancer screening: Type of screening: Colonoscopy. Completed 07/31/13. Repeat every 3 years. Past due. Pt was scheduled to have colonoscopy in 03/2020. Pt had to cancel and plans to reschedule with [facility] due to intermittent diarrhea.   Mammogram status: Completed 04/14/19. Repeat every year. Pt would like to discuss screening recommendations with Dr. Ronnald Ramp at next appointment.   Bone Density status: Completed 04/14/19. Results reflect: Bone density results: OSTEOPENIA. Repeat every 2 years.  Lung Cancer Screening: (Low Dose CT Chest recommended if Age 29-80 years, 30 pack-year currently smoking OR have quit w/in 15years.) does not qualify.   Additional Screening:  Hepatitis C Screening: does qualify; postponed  Vision Screening: Recommended annual ophthalmology exams for early detection of glaucoma  and other disorders of the eye. Is the patient up to date with their annual eye exam?  Yes  Who is the provider or what is the name of the office in which the patient attends annual eye exams? [facility]   Dental Screening: Recommended annual dental exams for  proper oral hygiene  Community Resource Referral / Chronic Care Management: CRR required this visit?  No   CCM required this visit?  No      Plan:     I have personally reviewed and noted the following in the patient's chart:   Medical and social history Use of alcohol, tobacco or illicit drugs  Current medications and supplements including opioid prescriptions.  Functional ability and status Nutritional status Physical activity Advanced directives List of other physicians Hospitalizations, surgeries, and ER visits in previous 12 months Vitals Screenings to include cognitive, depression, and falls Referrals and appointments  In addition, I have reviewed and discussed with patient certain preventive protocols, quality metrics, and best practice recommendations. A written personalized care plan for preventive services as well as general preventive health recommendations were provided to patient.     Clemetine Marker, LPN   [id number]   Nurse Notes: pt requests refills sent to [facility] for HCTZ, simvastatin & losartan. Pt states she always has issues when she requests through the pharmacy and would like [place] to send in; pt scheduled for follow up appt with Dr. Ronnald Ramp on 02/14/21.   Pt seen by neurology 01/15/20 and was recommend for 1 yr follow up. Pt denies issues with memory and cognitive status assessed today to be normal without any abnormalities. Pt states this is a concern of her daughter's but patient does not feel the need to go back to neurology at this time.

## 2021-01-17 ENCOUNTER — Other Ambulatory Visit: Payer: Self-pay | Admitting: Family Medicine

## 2021-01-17 DIAGNOSIS — E782 Mixed hyperlipidemia: Secondary | ICD-10-CM

## 2021-01-17 DIAGNOSIS — I10 Essential (primary) hypertension: Secondary | ICD-10-CM

## 2021-01-17 NOTE — Telephone Encounter (Signed)
Copied from [place] [id number]. Topic: Quick Communication - Rx Refill/Question >> Jan 17, 2021 10:28 AM Robina Ade, Helene Kelp D wrote: Medication: hydrochlorothiazide (HYDRODIURIL) 25 MG tablet, losartan (COZAAR) 100 MG tablet, simvastatin (ZOCOR) 40 MG tablet  Has the patient contacted their pharmacy? Yes.   (Agent: If no, request that the patient contact the pharmacy for the refill.) (Agent: If yes, when and what did the pharmacy advise?)  Preferred Pharmacy (with phone number or street name): [id number] - [place], [place] [address] AT [address]  Patient would like to let her provider that metformin was sent to her pharmacy which she does not need right now.  Agent: Please be advised that RX refills may take up to 3 business days. We ask that you follow-up with your pharmacy.

## 2021-02-14 ENCOUNTER — Ambulatory Visit (INDEPENDENT_AMBULATORY_CARE_PROVIDER_SITE_OTHER): Payer: Medicare Other | Admitting: Family Medicine

## 2021-02-14 ENCOUNTER — Encounter: Payer: Self-pay | Admitting: Family Medicine

## 2021-02-14 ENCOUNTER — Other Ambulatory Visit: Payer: Self-pay

## 2021-02-14 VITALS — BP 120/80 | HR 60 | Ht 65.0 in | Wt 123.0 lb

## 2021-02-14 DIAGNOSIS — E782 Mixed hyperlipidemia: Secondary | ICD-10-CM | POA: Diagnosis not present

## 2021-02-14 DIAGNOSIS — I10 Essential (primary) hypertension: Secondary | ICD-10-CM | POA: Diagnosis not present

## 2021-02-14 DIAGNOSIS — E119 Type 2 diabetes mellitus without complications: Secondary | ICD-10-CM | POA: Diagnosis not present

## 2021-02-14 DIAGNOSIS — Z23 Encounter for immunization: Secondary | ICD-10-CM | POA: Diagnosis not present

## 2021-02-14 MED ORDER — HYDROCHLOROTHIAZIDE 25 MG PO TABS: ORAL_TABLET | ORAL | 1 refills | Status: DC

## 2021-02-14 MED ORDER — LOSARTAN POTASSIUM 100 MG PO TABS: ORAL_TABLET | ORAL | 1 refills | Status: DC

## 2021-02-14 MED ORDER — SIMVASTATIN 40 MG PO TABS: ORAL_TABLET | ORAL | 1 refills | Status: DC

## 2021-02-14 MED ORDER — METFORMIN HCL 500 MG PO TABS: 500.0000 mg | ORAL_TABLET | Freq: Two times a day (BID) | ORAL | 1 refills | Status: DC

## 2021-02-14 NOTE — Progress Notes (Signed)
Date:  02/14/2021   Name:  Angela Pope   DOB:  1940-05-30   MRN:  [id number]   Chief Complaint: Hypertension, Hyperlipidemia, Diabetes, and Flu Vaccine  Hypertension This is a chronic problem. The current episode started more than 1 year ago. The problem has been gradually improving since onset. The problem is controlled. Pertinent negatives include no anxiety, blurred vision, chest pain, headaches, malaise/fatigue, neck pain, palpitations, peripheral edema, shortness of breath or sweats. Risk factors for coronary artery disease include diabetes mellitus and dyslipidemia. Past treatments include angiotensin blockers and diuretics. The current treatment provides significant improvement. There are no compliance problems.  There is no history of angina, kidney disease, CAD/MI, CVA, heart failure, left ventricular hypertrophy, PVD or retinopathy. There is no history of chronic renal disease, a hypertension causing med or renovascular disease.  Hyperlipidemia This is a chronic problem. The current episode started more than 1 year ago. The problem is controlled. Recent lipid tests were reviewed and are normal. Exacerbating diseases include diabetes. She has no history of chronic renal disease, hypothyroidism, liver disease, obesity or nephrotic syndrome. Pertinent negatives include no chest pain, focal weakness, leg pain, myalgias or shortness of breath. Current antihyperlipidemic treatment includes diet change, exercise and statins. The current treatment provides significant improvement of lipids. There are no compliance problems.  Risk factors for coronary artery disease include diabetes mellitus, dyslipidemia and hypertension.  Diabetes She presents for her follow-up diabetic visit. She has type 2 diabetes mellitus. No MedicAlert identification noted. Her disease course has been stable. Pertinent negatives for hypoglycemia include no confusion, dizziness, headaches, hunger, mood changes,  nervousness/anxiousness, pallor, seizures, sleepiness, speech difficulty, sweats or tremors. Associated symptoms include weight loss. Pertinent negatives for diabetes include no blurred vision, no chest pain, no fatigue, no foot paresthesias, no foot ulcerations, no polydipsia, no polyphagia, no polyuria, no visual change and no weakness. Pertinent negatives for hypoglycemia complications include no blackouts, no hospitalization, no nocturnal hypoglycemia, no required assistance and no required glucagon injection. Symptoms are stable. Pertinent negatives for diabetic complications include no autonomic neuropathy, CVA, impotence, nephropathy, peripheral neuropathy, PVD or retinopathy. Risk factors for coronary artery disease include diabetes mellitus, dyslipidemia and hypertension. Current diabetic treatment includes oral agent (monotherapy). She is compliant with treatment all of the time. Her weight is decreasing rapidly. She has not had a previous visit with a dietitian. She participates in exercise daily. Her breakfast blood glucose range is generally 90-110 mg/dl. Her overall blood glucose range is 90-110 mg/dl. An ACE inhibitor/angiotensin II receptor blocker is being taken. She does not see a podiatrist.Eye exam is current.   Lab Results  Component Value Date   CREATININE 0.74 06/29/2020   BUN 9 06/29/2020   NA 144 06/29/2020   K 4.4 06/29/2020   CL 103 06/29/2020   CO2 27 06/29/2020   Lab Results  Component Value Date   CHOL 145 06/29/2020   HDL 57 06/29/2020   LDLCALC 71 06/29/2020   TRIG 90 06/29/2020   CHOLHDL 3.4 03/27/2018   Lab Results  Component Value Date   TSH 1.260 03/27/2017   Lab Results  Component Value Date   HGBA1C 5.5 10/21/2020   Lab Results  Component Value Date   WBC 6.2 11/03/2019   HGB 15.8 11/03/2019   HCT 48.6 (H) 11/03/2019   MCV 94 11/03/2019   PLT 306 11/03/2019   Lab Results  Component Value Date   ALT 12 06/29/2020   AST 17 06/29/2020    ALKPHOS  56 06/29/2020   BILITOT 1.8 (H) 06/29/2020     Review of Systems  Constitutional:  Positive for weight loss. Negative for chills, fatigue, fever and malaise/fatigue.  HENT:  Negative for drooling, ear discharge, ear pain and sore throat.   Eyes:  Negative for blurred vision.  Respiratory:  Negative for cough, shortness of breath and wheezing.   Cardiovascular:  Negative for chest pain, palpitations and leg swelling.  Gastrointestinal:  Negative for abdominal pain, blood in stool, constipation, diarrhea and nausea.  Endocrine: Negative for polydipsia, polyphagia and polyuria.  Genitourinary:  Negative for dysuria, frequency, hematuria, impotence and urgency.  Musculoskeletal:  Negative for back pain, myalgias and neck pain.  Skin:  Negative for pallor and rash.  Allergic/Immunologic: Negative for environmental allergies.  Neurological:  Negative for dizziness, tremors, focal weakness, seizures, speech difficulty, weakness and headaches.  Hematological:  Does not bruise/bleed easily.  Psychiatric/Behavioral:  Negative for confusion and suicidal ideas. The patient is not nervous/anxious.    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   Bilateral lower extremity edema 01/14/2015   Essential hypertension 01/13/2015   Diabetes mellitus type 2, controlled, without complications ([place]) 43/32/9518   Hyperlipidemia 01/13/2015   Overweight (BMI 25.0-29.9) 01/13/2015   Allergic rhinitis 01/13/2015   Hx of colonic polyps 01/13/2015   Hx of resection of large bowel 01/13/2015   Chronic right hip pain 01/13/2015   Vitamin D deficiency 01/13/2015   FH: heart disease 01/13/2015   FH: stroke 01/13/2015   Incomplete emptying of bladder 07/02/2009   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    Allergies  Allergen Reactions   Sulfa Antibiotics     Tetracyclines & Related    Clarithromycin Rash    Past Surgical History:  Procedure Laterality Date   COLONOSCOPY  2015   large intestine surgery     SMALL INTESTINE SURGERY  2012   polyp that could't be removed    Social History   Tobacco Use   Smoking status: Former    Packs/day: 0.25    Years: 10.00    Pack years: 2.50    Types: Cigarettes    Quit date: 10/18/1965    Years since quitting: 55.3   Smokeless tobacco: Never   Tobacco comments:    smoking cessation materials not required  Vaping Use   Vaping Use: Never used  Substance Use Topics   Alcohol use: Not Currently    Alcohol/week: 0.0 standard drinks   Drug use: No     Medication list has been reviewed and updated.  Current Meds  Medication Sig   Calcium Carb-Cholecalciferol (CALCIUM 1000 + D) 1000-800 MG-UNIT TABS Take 1 tablet by mouth daily.   glucose blood test strip Use to check Blood Sugar 3 times daily for ICD10: E11.9(One touch ultra blue brand. )   hydrochlorothiazide (HYDRODIURIL) 25 MG tablet TAKE 1 TABLET(25 MG) BY MOUTH DAILY   Lancets (ONETOUCH ULTRASOFT) lancets Use to check Blood Sugar 3 times daily for ICD 10:  E11.9  (Touch Twist Lancets for Ultra Touch)   loperamide (IMODIUM) 2 MG capsule Take 2 mg by mouth as needed for diarrhea or loose stools.   losartan (COZAAR) 100 MG tablet TAKE 1 TABLET(100 MG) BY MOUTH DAILY   metFORMIN (GLUCOPHAGE) 500 MG tablet Take 1 tablet (500 mg total) by mouth 2 (two) times daily with a meal.   Multiple  Vitamin (MULTIVITAMIN) capsule Take 1 capsule by mouth daily.   simvastatin (ZOCOR) 40 MG tablet TAKE 1 TABLET(40 MG) BY MOUTH DAILY   vitamin E 180 MG (400 UNITS) capsule Take 400 Units by mouth daily.    PHQ 2/9 Scores 02/14/2021 01/10/2021 10/11/2020 06/29/2020  PHQ - 2 Score 0 0 0 0  PHQ- 9 Score 0 - 0 0    GAD 7 : Generalized Anxiety Score 02/14/2021 10/11/2020 06/29/2020 03/08/2020  Nervous, Anxious, on Edge 0 0 0 0  Control/stop worrying 0 0 0 0  Worry  too much - different things 0 0 0 0  Trouble relaxing 0 0 0 0  Restless 0 0 0 0  Easily annoyed or irritable 0 0 0 0  Afraid - awful might happen 0 0 0 0  Total GAD 7 Score 0 0 0 0  Anxiety Difficulty - - - -    BP Readings from Last 3 Encounters:  10/11/20 120/80  06/29/20 114/62  05/06/20 120/74    Physical Exam Vitals and nursing note reviewed.  Constitutional:      Appearance: She is well-developed.  HENT:     Head: Normocephalic.     Right Ear: Tympanic membrane, ear canal and external ear normal.     Left Ear: Tympanic membrane, ear canal and external ear normal.     Nose: Nose normal. No congestion or rhinorrhea.     Mouth/Throat:     Mouth: Mucous membranes are moist.  Eyes:     General: Lids are everted, no foreign bodies appreciated. No scleral icterus.       Left eye: No foreign body or hordeolum.     Conjunctiva/sclera: Conjunctivae normal.     Right eye: Right conjunctiva is not injected.     Left eye: Left conjunctiva is not injected.     Pupils: Pupils are equal, round, and reactive to light.  Neck:     Thyroid: No thyromegaly.     Vascular: No JVD.     Trachea: No tracheal deviation.  Cardiovascular:     Rate and Rhythm: Normal rate and regular rhythm.     Heart sounds: Normal heart sounds. No murmur heard.   No friction rub. No gallop.  Pulmonary:     Effort: Pulmonary effort is normal. No respiratory distress.     Breath sounds: Normal breath sounds. No wheezing, rhonchi or rales.  Abdominal:     General: Bowel sounds are normal.     Palpations: Abdomen is soft. There is no mass.     Tenderness: There is no abdominal tenderness. There is no guarding or rebound.  Musculoskeletal:        General: No tenderness. Normal range of motion.     Cervical back: Normal range of motion and neck supple.  Lymphadenopathy:     Cervical: No cervical adenopathy.  Skin:    General: Skin is warm.     Findings: No rash.  Neurological:     Mental Status: She is  alert and oriented to person, place, and time.     Cranial Nerves: No cranial nerve deficit.     Deep Tendon Reflexes: Reflexes normal.  Psychiatric:        Mood and Affect: Mood is not anxious or depressed.    Wt Readings from Last 3 Encounters:  02/14/21 123 lb (55.8 kg)  10/11/20 132 lb (59.9 kg)  06/29/20 137 lb (62.1 kg)    Ht 5\' 5"  (1.651 m)   Wt 123  lb (55.8 kg)   BMI 20.47 kg/m   Assessment and Plan:  1. Essential hypertension Chronic. Controlled. Stable. Bp 120/80. Weight loss 20  lbs. Cont hctz 25 mg and losartan 100 mg daily. Check renal panel - hydrochlorothiazide (HYDRODIURIL) 25 MG tablet; TAKE 1 TABLET(25 MG) BY MOUTH DAILY  Dispense: 90 tablet; Refill: 1 - losartan (COZAAR) 100 MG tablet; TAKE 1 TABLET(100 MG) BY MOUTH DAILY  Dispense: 90 tablet; Refill: 1 - Renal Function Panel  2. Controlled type 2 diabetes mellitus without complication, without long-term current use of insulin (HCC) Chronic. Controlled . Uncomplicated. Desires to cont metformen 500mg  daily will check a1c and micrialbuminuria.  - metFORMIN (GLUCOPHAGE) 500 MG tablet; Take 1 tablet (500 mg total) by mouth 2 (two) times daily with a meal.  Dispense: 180 tablet; Refill: 1 - HgB A1c - Microalbumin, urine  3. Mixed hyperlipidemia  Chronic controlled. Stable cont zocor 40 mg - simvastatin (ZOCOR) 40 MG tablet; TAKE 1 TABLET(40 MG) BY MOUTH DAILY  Dispense: 90 tablet; Refill: 1  4. Need for immunization against influenza Discussed and administered. - Flu Vaccine QUAD High Dose(Fluad)

## 2021-02-15 DIAGNOSIS — I10 Essential (primary) hypertension: Secondary | ICD-10-CM | POA: Diagnosis not present

## 2021-02-15 DIAGNOSIS — E119 Type 2 diabetes mellitus without complications: Secondary | ICD-10-CM | POA: Diagnosis not present

## 2021-02-16 LAB — HEMOGLOBIN A1C
Est. average glucose Bld gHb Est-mCnc: 108 mg/dL
Hgb A1c MFr Bld: 5.4 % (ref 4.8–5.6)

## 2021-02-16 LAB — RENAL FUNCTION PANEL
Albumin: 4.2 g/dL (ref 3.7–4.7)
BUN/Creatinine Ratio: 13 (ref 12–28)
BUN: 8 mg/dL (ref 8–27)
CO2: 25 mmol/L (ref 20–29)
Calcium: 9.5 mg/dL (ref 8.7–10.3)
Chloride: 95 mmol/L — ABNORMAL LOW (ref 96–106)
Creatinine, Ser: 0.62 mg/dL (ref 0.57–1.00)
Glucose: 111 mg/dL — ABNORMAL HIGH (ref 65–99)
Phosphorus: 3.8 mg/dL (ref 3.0–4.3)
Potassium: 3.7 mmol/L (ref 3.5–5.2)
Sodium: 134 mmol/L (ref 134–144)
eGFR: 91 mL/min/{1.73_m2} (ref >59)

## 2021-02-16 LAB — MICROALBUMIN, URINE: Microalbumin, Urine: 10.2 ug/mL

## 2021-02-22 ENCOUNTER — Other Ambulatory Visit: Payer: Self-pay

## 2021-02-22 ENCOUNTER — Ambulatory Visit: Payer: Medicare Other | Attending: Internal Medicine

## 2021-02-22 DIAGNOSIS — Z23 Encounter for immunization: Secondary | ICD-10-CM

## 2021-02-22 MED ORDER — PFIZER COVID-19 VAC BIVALENT 30 MCG/0.3ML IM SUSP
INTRAMUSCULAR | 0 refills | Status: DC
  Filled 2021-02-22: qty 0.3, 1d supply, fill #0

## 2021-02-22 NOTE — Progress Notes (Signed)
   Covid-19 Vaccination Clinic  Name:  Angela Pope    MRN: [id number] DOB: 02-20-1941  02/22/2021  [name] was observed post Covid-19 immunization for 15 minutes without incident. She was provided with Vaccine Information Sheet and instruction to access the V-Safe system.   [name] was instructed to call 911 with any severe reactions post vaccine: Difficulty breathing  Swelling of face and throat  A fast heartbeat  A bad rash all over body  Dizziness and weakness   Lu Duffel, PharmD, MBA Clinical Acute Care Pharmacist

## 2021-06-16 ENCOUNTER — Encounter: Payer: Self-pay | Admitting: Family Medicine

## 2021-06-16 ENCOUNTER — Ambulatory Visit (INDEPENDENT_AMBULATORY_CARE_PROVIDER_SITE_OTHER): Payer: Medicare Other | Admitting: Family Medicine

## 2021-06-16 ENCOUNTER — Other Ambulatory Visit: Payer: Self-pay

## 2021-06-16 VITALS — BP 120/80 | HR 60 | Ht 65.0 in | Wt 131.0 lb

## 2021-06-16 DIAGNOSIS — E782 Mixed hyperlipidemia: Secondary | ICD-10-CM

## 2021-06-16 DIAGNOSIS — E119 Type 2 diabetes mellitus without complications: Secondary | ICD-10-CM

## 2021-06-16 DIAGNOSIS — I10 Essential (primary) hypertension: Secondary | ICD-10-CM | POA: Diagnosis not present

## 2021-06-16 MED ORDER — SIMVASTATIN 40 MG PO TABS: ORAL_TABLET | ORAL | 1 refills | Status: DC

## 2021-06-16 MED ORDER — HYDROCHLOROTHIAZIDE 25 MG PO TABS: ORAL_TABLET | ORAL | 1 refills | Status: DC

## 2021-06-16 MED ORDER — METFORMIN HCL 500 MG PO TABS: 500.0000 mg | ORAL_TABLET | Freq: Two times a day (BID) | ORAL | 1 refills | Status: DC

## 2021-06-16 MED ORDER — LOSARTAN POTASSIUM 100 MG PO TABS: ORAL_TABLET | ORAL | 1 refills | Status: DC

## 2021-06-16 NOTE — Progress Notes (Signed)
1. Essential hypertension Chronic.  Controlled.  Stable.  Blood pressure 120/80.  Continue losartan 100 mg once a day and hydrochlorothiazide 25 mg once a day. - losartan (COZAAR) 100 MG tablet; TAKE 1 TABLET(100 MG) BY MOUTH DAILY  Dispense: 90 tablet; Refill: 1 - hydrochlorothiazide (HYDRODIURIL) 25 MG tablet; TAKE 1 TABLET(25 MG) BY MOUTH DAILY  Dispense: 90 tablet; Refill: 1  2. Controlled type 2 diabetes mellitus without complication, without long-term current use of insulin (HCC) Chronic.  Controlled.  Stable.  Continue metformin 500 mg twice a day.  Will check A1c for current status of glucose control. - HgB A1c - Lipid Panel With LDL/HDL Ratio - metFORMIN (GLUCOPHAGE) 500 MG tablet; Take 1 tablet (500 mg total) by mouth 2 (two) times daily with a meal.  Dispense: 180 tablet; Refill: 1  3. Mixed hyperlipidemia Chronic.  Controlled.  Stable.  Continue simvastatin 40 mg once a day. - simvastatin (ZOCOR) 40 MG tablet; TAKE 1 TABLET(40 MG) BY MOUTH DAILY  Dispense: 90 tablet; Refill: 1

## 2021-06-17 LAB — LIPID PANEL WITH LDL/HDL RATIO
Cholesterol, Total: 150 mg/dL (ref 100–199)
HDL: 58 mg/dL (ref >39)
LDL Chol Calc (NIH): 76 mg/dL (ref 0–99)
LDL/HDL Ratio: 1.3 ratio (ref 0.0–3.2)
Triglycerides: 83 mg/dL (ref 0–149)
VLDL Cholesterol Cal: 16 mg/dL (ref 5–40)

## 2021-06-17 LAB — HEMOGLOBIN A1C
Est. average glucose Bld gHb Est-mCnc: 111 mg/dL
Hgb A1c MFr Bld: 5.5 % (ref 4.8–5.6)

## 2021-06-23 ENCOUNTER — Other Ambulatory Visit (INDEPENDENT_AMBULATORY_CARE_PROVIDER_SITE_OTHER): Payer: Medicare Other

## 2021-06-23 DIAGNOSIS — Z1211 Encounter for screening for malignant neoplasm of colon: Secondary | ICD-10-CM | POA: Diagnosis not present

## 2021-06-23 LAB — HEMOCCULT GUIAC POC 1CARD (OFFICE)
Card #2 Fecal Occult Blod, POC: NEGATIVE
Fecal Occult Blood, POC: NEGATIVE

## 2021-06-23 NOTE — Progress Notes (Signed)
2 out of 3 turned in and both were negative

## 2021-07-29 DIAGNOSIS — H40003 Preglaucoma, unspecified, bilateral: Secondary | ICD-10-CM | POA: Diagnosis not present

## 2021-07-29 LAB — HM DIABETES EYE EXAM

## 2021-10-13 ENCOUNTER — Telehealth: Payer: Self-pay

## 2021-10-13 ENCOUNTER — Telehealth: Payer: Self-pay | Admitting: Family Medicine

## 2021-10-13 NOTE — Telephone Encounter (Signed)
Called pt daughter Junie Panning left VM to call back. Pt was seeing someone for this problem and stopped going. We can only suggest the pt be seen somewhere but it is the patients decision if she goes or not.  [facility] nurse may give results to patient if they return call to clinic, a CRM has been created.  KP

## 2021-10-13 NOTE — Telephone Encounter (Signed)
Copied from [place] [id number]. Topic: General - Other >> Oct 13, 2021  9:15 AM McGill, Nelva Bush wrote: Reason for CRM: Pt daughter stated her mother has a checkup tomorrow, and she continues to be concerned about her memory and feels she needs further evaluation.  Pt daughter stated she would like pt to be referred to a specialist.  Pt stated pt has very little insight on this. Pt is making notes every time she has to do anything. Pt daughter gave examples about her forgetting to pay bills and about appointments.     Pt daughter is requesting a call back .

## 2021-10-13 NOTE — Telephone Encounter (Signed)
Error.     KP 

## 2021-10-13 NOTE — Telephone Encounter (Signed)
Copied from [place] [id number]. Topic: Referral - Request for Referral >> Oct 13, 2021  3:14 PM Tessa Lerner A wrote: Has patient seen PCP for this complaint? Yes.   *If NO, is insurance requiring patient see PCP for this issue before PCP can refer them? Referral for which specialty: Neurology  Preferred provider/office: Patient has no preference  Reason for referral: The patient's daughter has concerns related to the patient's memory

## 2021-10-14 ENCOUNTER — Encounter: Payer: Self-pay | Admitting: Family Medicine

## 2021-10-14 ENCOUNTER — Ambulatory Visit (INDEPENDENT_AMBULATORY_CARE_PROVIDER_SITE_OTHER): Payer: Medicare Other | Admitting: Family Medicine

## 2021-10-14 VITALS — BP 120/78 | HR 64 | Ht 65.0 in | Wt 131.0 lb

## 2021-10-14 DIAGNOSIS — E119 Type 2 diabetes mellitus without complications: Secondary | ICD-10-CM

## 2021-10-14 DIAGNOSIS — R413 Other amnesia: Secondary | ICD-10-CM | POA: Diagnosis not present

## 2021-10-14 DIAGNOSIS — I10 Essential (primary) hypertension: Secondary | ICD-10-CM | POA: Diagnosis not present

## 2021-10-14 DIAGNOSIS — E782 Mixed hyperlipidemia: Secondary | ICD-10-CM

## 2021-10-14 MED ORDER — LOSARTAN POTASSIUM 100 MG PO TABS: ORAL_TABLET | ORAL | 1 refills | Status: DC

## 2021-10-14 MED ORDER — METFORMIN HCL 500 MG PO TABS: 500.0000 mg | ORAL_TABLET | Freq: Two times a day (BID) | ORAL | 1 refills | Status: DC

## 2021-10-14 MED ORDER — SIMVASTATIN 40 MG PO TABS: ORAL_TABLET | ORAL | 1 refills | Status: DC

## 2021-10-14 MED ORDER — HYDROCHLOROTHIAZIDE 25 MG PO TABS: ORAL_TABLET | ORAL | 1 refills | Status: DC

## 2021-10-14 NOTE — Progress Notes (Signed)
Date:  10/14/2021   Name:  Angela Pope   DOB:  [birth year]   MRN:  [id number]   Chief Complaint: Hypertension, Hyperlipidemia, and Diabetes  Hypertension This is a chronic problem. The current episode started more than 1 year ago. The problem has been gradually improving since onset. The problem is controlled. Pertinent negatives include no anxiety, blurred vision, chest pain, headaches, malaise/fatigue, neck pain, orthopnea, palpitations, peripheral edema, PND, shortness of breath or sweats. There are no associated agents to hypertension. Risk factors for coronary artery disease include diabetes mellitus and dyslipidemia. Past treatments include angiotensin blockers and diuretics. The current treatment provides moderate improvement. There are no compliance problems.  There is no history of angina, kidney disease, CAD/MI, CVA, heart failure, left ventricular hypertrophy, PVD or retinopathy. There is no history of chronic renal disease, a hypertension causing med or renovascular disease.  Hyperlipidemia This is a chronic problem. The current episode started more than 1 year ago. The problem is controlled. Recent lipid tests were reviewed and are normal. Exacerbating diseases include diabetes. She has no history of chronic renal disease. Pertinent negatives include no chest pain, focal sensory loss, focal weakness, myalgias or shortness of breath. Current antihyperlipidemic treatment includes statins. The current treatment provides moderate improvement of lipids. There are no compliance problems.  Risk factors for coronary artery disease include dyslipidemia and hypertension.  Diabetes She presents for her follow-up diabetic visit. She has type 2 diabetes mellitus. Her disease course has been stable. There are no hypoglycemic associated symptoms. Pertinent negatives for hypoglycemia include no confusion, dizziness, headaches, nervousness/anxiousness or sweats. Pertinent negatives for diabetes include  no blurred vision, no chest pain, no fatigue, no visual change and no weakness. There are no hypoglycemic complications. Symptoms are stable. There are no diabetic complications. Pertinent negatives for diabetic complications include no CVA, PVD or retinopathy. Risk factors for coronary artery disease include dyslipidemia, diabetes mellitus and hypertension. Current diabetic treatment includes oral agent (monotherapy) and diet. She is compliant with treatment all of the time. Her weight is stable. She is following a generally healthy diet. Meal planning includes avoidance of concentrated sweets and carbohydrate counting. She participates in exercise daily. Her home blood glucose trend is fluctuating minimally. Her breakfast blood glucose is taken between 7-8 am. Her breakfast blood glucose range is generally 90-110 mg/dl. An ACE inhibitor/angiotensin II receptor blocker is being taken.  Neurologic Problem The patient's pertinent negatives include no altered mental status, clumsiness, focal sensory loss, focal weakness, loss of balance, memory loss, near-syncope, slurred speech, syncope, visual change or weakness. Primary symptoms comment: memory. This is a new problem. The current episode started more than 1 month ago (one episode). There was no focality noted. Pertinent negatives include no abdominal pain, back pain, chest pain, confusion, dizziness, fatigue, fever, headaches, nausea, neck pain, palpitations or shortness of breath.   Lab Results  Component Value Date   NA 134 02/15/2021   K 3.7 02/15/2021   CO2 25 02/15/2021   GLUCOSE 111 (H) 02/15/2021   BUN 8 02/15/2021   CREATININE 0.62 02/15/2021   CALCIUM 9.5 02/15/2021   EGFR 91 02/15/2021   GFRNONAA 77 06/29/2020   Lab Results  Component Value Date   CHOL 150 06/16/2021   HDL 58 06/16/2021   LDLCALC 76 06/16/2021   TRIG 83 06/16/2021   CHOLHDL 3.4 03/27/2018   Lab Results  Component Value Date   TSH 1.260 03/27/2017   Lab Results   Component Value Date   HGBA1C  5.5 06/16/2021   Lab Results  Component Value Date   WBC 6.2 11/03/2019   HGB 15.8 11/03/2019   HCT 48.6 (H) 11/03/2019   MCV 94 11/03/2019   PLT 306 11/03/2019   Lab Results  Component Value Date   ALT 12 06/29/2020   AST 17 06/29/2020   ALKPHOS 56 06/29/2020   BILITOT 1.8 (H) 06/29/2020   Lab Results  Component Value Date   VD25OH 32.3 06/18/2017     Review of Systems  Constitutional: Negative.  Negative for chills, fatigue, fever, malaise/fatigue and unexpected weight change.  HENT:  Negative for congestion, ear discharge, ear pain, rhinorrhea, sinus pressure, sneezing and sore throat.   Eyes:  Negative for blurred vision.  Respiratory:  Negative for cough, shortness of breath, wheezing and stridor.   Cardiovascular:  Negative for chest pain, palpitations, orthopnea, PND and near-syncope.  Gastrointestinal:  Negative for abdominal pain, blood in stool, constipation, diarrhea and nausea.  Genitourinary:  Negative for dysuria, flank pain, frequency, hematuria, urgency and vaginal discharge.  Musculoskeletal:  Negative for arthralgias, back pain, myalgias and neck pain.  Skin:  Negative for rash.  Neurological:  Negative for dizziness, focal weakness, syncope, weakness, headaches and loss of balance.  Hematological:  Negative for adenopathy. Does not bruise/bleed easily.  Psychiatric/Behavioral:  Negative for confusion, decreased concentration, dysphoric mood and memory loss. The patient is not nervous/anxious.    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   Bilateral lower extremity edema 01/14/2015   Essential hypertension 01/13/2015   Diabetes mellitus type 2, controlled, without complications ([place]) 62/13/0865   Hyperlipidemia 01/13/2015   Overweight (BMI 25.0-29.9) 01/13/2015   Allergic rhinitis 01/13/2015   Hx of  colonic polyps 01/13/2015   Hx of resection of large bowel 01/13/2015   Chronic right hip pain 01/13/2015   Vitamin D deficiency 01/13/2015   FH: heart disease 01/13/2015   FH: stroke 01/13/2015   Incomplete emptying of bladder 07/02/2009   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    Allergies  Allergen Reactions   Sulfa Antibiotics    Tetracyclines & Related    Clarithromycin Rash    Past Surgical History:  Procedure Laterality Date   COLONOSCOPY  2015   large intestine surgery     SMALL INTESTINE SURGERY  2012   polyp that could't be removed    Social History   Tobacco Use   Smoking status: Former    Packs/day: 0.25    Years: 10.00    Pack years: 2.50    Types: Cigarettes    Quit date: 10/18/1965    Years since quitting: 56.0   Smokeless tobacco: Never   Tobacco comments:    smoking cessation materials not required  Vaping Use   Vaping Use: Never used  Substance Use Topics   Alcohol use: Not Currently    Alcohol/week: 0.0 standard drinks   Drug use: No     Medication list has been reviewed and updated.  Current Meds  Medication Sig   Calcium Carb-Cholecalciferol (CALCIUM 1000 + D) 1000-800 MG-UNIT TABS Take 1 tablet by mouth daily.   glucose blood test strip Use to check Blood Sugar 3 times daily for ICD10: E11.9(One touch ultra blue brand. )   hydrochlorothiazide (HYDRODIURIL) 25 MG tablet TAKE 1 TABLET(25 MG) BY MOUTH DAILY   Lancets (ONETOUCH ULTRASOFT) lancets Use to check Blood Sugar 3 times  daily for ICD 10:  E11.9  (Touch Twist Lancets for Ultra Touch)   loperamide (IMODIUM) 2 MG capsule Take 2 mg by mouth as needed for diarrhea or loose stools.   losartan (COZAAR) 100 MG tablet TAKE 1 TABLET(100 MG) BY MOUTH DAILY   metFORMIN (GLUCOPHAGE) 500 MG tablet Take 1 tablet (500 mg total) by mouth 2 (two) times daily with a meal.   Multiple Vitamin (MULTIVITAMIN) capsule Take 1 capsule by mouth daily.   simvastatin (ZOCOR) 40 MG tablet  TAKE 1 TABLET(40 MG) BY MOUTH DAILY   vitamin E 180 MG (400 UNITS) capsule Take 400 Units by mouth daily.   [DISCONTINUED] COVID-19 mRNA bivalent vaccine, Pfizer, (PFIZER COVID-19 VAC BIVALENT) injection Inject into the muscle.       10/14/2021   10:32 AM 06/16/2021   10:33 AM 02/14/2021    9:33 AM 10/11/2020    9:26 AM  GAD 7 : Generalized Anxiety Score  Nervous, Anxious, on Edge 0 0 0 0  Control/stop worrying 0 0 0 0  Worry too much - different things 0 0 0 0  Trouble relaxing 0 0 0 0  Restless 0 0 0 0  Easily annoyed or irritable 0 0 0 0  Afraid - awful might happen 0 0 0 0  Total GAD 7 Score 0 0 0 0  Anxiety Difficulty Not difficult at all Not difficult at all         10/14/2021   10:32 AM  Depression screen PHQ 2/9  Decreased Interest 0  Down, Depressed, Hopeless 0  PHQ - 2 Score 0  Altered sleeping 0  Tired, decreased energy 0  Change in appetite 0  Feeling bad or failure about yourself  0  Trouble concentrating 0  Moving slowly or fidgety/restless 0  Suicidal thoughts 0  PHQ-9 Score 0  Difficult doing work/chores Not difficult at all    BP Readings from Last 3 Encounters:  10/14/21 120/78  06/16/21 120/80  02/14/21 120/80    Physical Exam Vitals and nursing note reviewed. Exam conducted with a chaperone present.  Constitutional:      General: She is not in acute distress.    Appearance: She is not diaphoretic.  HENT:     Head: Normocephalic and atraumatic.     Right Ear: Tympanic membrane, ear canal and external ear normal.     Left Ear: Tympanic membrane, ear canal and external ear normal.     Nose: Nose normal. No congestion or rhinorrhea.     Mouth/Throat:     Mouth: Mucous membranes are moist.  Eyes:     General:        Right eye: No discharge.        Left eye: No discharge.     Conjunctiva/sclera: Conjunctivae normal.     Pupils: Pupils are equal, round, and reactive to light.  Neck:     Thyroid: No thyromegaly.     Vascular: No carotid bruit  or JVD.  Cardiovascular:     Rate and Rhythm: Normal rate and regular rhythm.     Heart sounds: Normal heart sounds. No murmur heard.   No friction rub. No gallop.  Pulmonary:     Effort: Pulmonary effort is normal.     Breath sounds: Normal breath sounds. No wheezing, rhonchi or rales.  Abdominal:     General: Bowel sounds are normal.     Palpations: Abdomen is soft. There is no mass.     Tenderness: There is no  abdominal tenderness. There is no guarding.  Musculoskeletal:        General: Normal range of motion.     Cervical back: Normal range of motion and neck supple.  Lymphadenopathy:     Cervical: No cervical adenopathy.  Skin:    General: Skin is warm and dry.  Neurological:     General: No focal deficit present.     Mental Status: She is alert.     Deep Tendon Reflexes: Reflexes are normal and symmetric.    Wt Readings from Last 3 Encounters:  10/14/21 131 lb (59.4 kg)  06/16/21 131 lb (59.4 kg)  02/14/21 123 lb (55.8 kg)    BP 120/78   Pulse 64   Ht _0  (1.651 m)   Wt 131 lb (59.4 kg)   BMI 21.80 kg/m   Assessment and Plan:  1. Essential hypertension Chronic.  Controlled.  Stable.  Continue hydrochlorothiazide 25 mg once a day and losartan 100 mg once a day.  Will check CMP for electrolytes and GFR.  Will return in 6 months for recheck. - Comprehensive Metabolic Panel (CMET) - hydrochlorothiazide (HYDRODIURIL) 25 MG tablet; TAKE 1 TABLET(25 MG) BY MOUTH DAILY  Dispense: 90 tablet; Refill: 1 - losartan (COZAAR) 100 MG tablet; TAKE 1 TABLET(100 MG) BY MOUTH DAILY  Dispense: 90 tablet; Refill: 1  2. Controlled type 2 diabetes mellitus without complication, without long-term current use of insulin (HCC) Chronic.  Controlled.  Stable.  Foot exam was done and was normal.  Continuance of metformin 500 mg 1 twice a day with meals.  Will check A1c for current level of control.  We will recheck patient in 4 months. - HgB A1c - metFORMIN (GLUCOPHAGE) 500 MG tablet;  Take 1 tablet (500 mg total) by mouth 2 (two) times daily with a meal.  Dispense: 180 tablet; Refill: 1  3. Mixed hyperlipidemia Chronic.  Controlled.  Stable.  Continue simvastatin 40 mg once a day.  Review of previous 6 months lipid panel was in normal range and we will forego lipid panel today and will recheck in 6 months. 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  Apparently patient had either a delayed payment or a mistake by the post office delay of a payment on insurance which resulted in cancellation of insurance.  This has been brought up as a concern of memory impairment by a family member and we are further investigating at this point.  From the patient standpoint particular piece of mail directly to the post office and to delay occurred in the delivery of the payment which was made in time.  Review of cognitive and evaluation approximately 2 years ago patient was noted to correctly answer 29 of 30 questions and did well on the test.  Patient is in agreement to return to neurology for reevaluation and surveillance and she will proceed to do so and we will place the referral. - Ambulatory referral to Neurology

## 2021-10-14 NOTE — Telephone Encounter (Signed)
Patient had OV today with Dr. Ronnald Ramp, this issue was discussed.

## 2021-10-15 LAB — COMPREHENSIVE METABOLIC PANEL
ALT: 13 IU/L (ref 0–32)
AST: 19 IU/L (ref 0–40)
Albumin/Globulin Ratio: 1.9 (ref 1.2–2.2)
Albumin: 4.4 g/dL (ref 3.7–4.7)
Alkaline Phosphatase: 54 IU/L (ref 44–121)
BUN/Creatinine Ratio: 11 — ABNORMAL LOW (ref 12–28)
BUN: 8 mg/dL (ref 8–27)
Bilirubin Total: 1.5 mg/dL — ABNORMAL HIGH (ref 0.0–1.2)
CO2: 25 mmol/L (ref 20–29)
Calcium: 9.7 mg/dL (ref 8.7–10.3)
Chloride: 103 mmol/L (ref 96–106)
Creatinine, Ser: 0.73 mg/dL (ref 0.57–1.00)
Globulin, Total: 2.3 g/dL (ref 1.5–4.5)
Glucose: 108 mg/dL — ABNORMAL HIGH (ref 70–99)
Potassium: 3.3 mmol/L — ABNORMAL LOW (ref 3.5–5.2)
Sodium: 143 mmol/L (ref 134–144)
Total Protein: 6.7 g/dL (ref 6.0–8.5)
eGFR: 83 mL/min/{1.73_m2} (ref >59)

## 2021-10-15 LAB — HEMOGLOBIN A1C
Est. average glucose Bld gHb Est-mCnc: 105 mg/dL
Hgb A1c MFr Bld: 5.3 % (ref 4.8–5.6)

## 2021-10-17 ENCOUNTER — Other Ambulatory Visit: Payer: Self-pay

## 2021-10-17 DIAGNOSIS — E876 Hypokalemia: Secondary | ICD-10-CM

## 2021-10-17 MED ORDER — POTASSIUM CHLORIDE ER 10 MEQ PO TBCR: 10.0000 meq | EXTENDED_RELEASE_TABLET | Freq: Every day | ORAL | 0 refills | Status: DC

## 2021-10-17 NOTE — Progress Notes (Signed)
Sent in pot 10 meq

## 2022-01-10 NOTE — Progress Notes (Unsigned)
I connected with  Trystan Maciver on 01/11/2022 by a audio enabled telemedicine application and verified that I am speaking with the correct person using two identifiers.  Patient Location: Home  Provider Location: Office/Clinic  I discussed the limitations of evaluation and management by telemedicine. The patient expressed understanding and agreed to proceed.  Subjective:   Angela Pope is a 81 y.o. female who presents for Medicare Annual (Subsequent) preventive examination.  Review of Systems    Per HPI unless specifically indicated below Cardiac Risk Factors include: advanced age (>77mn, >>23women);female gender          Objective:    There were no vitals filed for this visit. There is no height or weight on file to calculate BMI.     01/10/2021    8:26 AM 01/07/2020    8:22 AM 01/06/2019    8:27 AM 10/31/2017   11:33 AM 03/15/2017   11:30 AM 10/30/2016    9:32 AM 01/13/2015    1:59 PM  Advanced Directives  Does Patient Have a Medical Advance Directive? Yes Yes Yes Yes No Yes Yes  Type of AParamedicof AArtesiaLiving will HHeboLiving will HUnionLiving will HBurbankLiving will  [place]ungoteagueLiving will Living will  Copy of [place]in Chart? Yes - validated most recent copy scanned in chart (See row information) No - copy requested No - copy requested No - copy requested  No - copy requested     Current Medications (verified) Outpatient Encounter Medications as of 01/11/2022  Medication Sig   Calcium Carb-Cholecalciferol (CALCIUM 1000 + D) 1000-800 MG-UNIT TABS Take 1 tablet by mouth daily.   glucose blood test strip Use to check Blood Sugar 3 times daily for ICD10: E11.9(One touch ultra blue brand. )   hydrochlorothiazide (HYDRODIURIL) 25 MG tablet TAKE 1 TABLET(25 MG) BY MOUTH DAILY   Lancets (ONETOUCH ULTRASOFT) lancets Use to check Blood  Sugar 3 times daily for ICD 10:  E11.9  (Touch Twist Lancets for Ultra Touch)   loperamide (IMODIUM) 2 MG capsule Take 2 mg by mouth as needed for diarrhea or loose stools.   losartan (COZAAR) 100 MG tablet TAKE 1 TABLET(100 MG) BY MOUTH DAILY   metFORMIN (GLUCOPHAGE) 500 MG tablet Take 1 tablet (500 mg total) by mouth 2 (two) times daily with a meal.   Multiple Vitamin (MULTIVITAMIN) capsule Take 1 capsule by mouth daily.   potassium chloride (KLOR-CON) 10 MEQ tablet Take 1 tablet (10 mEq total) by mouth daily.   simvastatin (ZOCOR) 40 MG tablet TAKE 1 TABLET(40 MG) BY MOUTH DAILY   vitamin E 180 MG (400 UNITS) capsule Take 400 Units by mouth daily.   No facility-administered encounter medications on file as of 01/11/2022.    Allergies (verified) Sulfa antibiotics, Tetracyclines & related, and Clarithromycin   History: Past Medical History:  Diagnosis Date   Diabetes mellitus without complication (HCC)    controls with diet   Family history of adverse reaction to anesthesia    Daughter and son have problems (like their father) with Succinylcholine   Hyperlipidemia    Hypertension    Past Surgical History:  Procedure Laterality Date   COLONOSCOPY  2015   large intestine surgery     SMALL INTESTINE SURGERY  2012   polyp that could't be removed   Family History  Problem Relation Age of Onset   Heart disease Mother  Hypertension Mother    Atrial fibrillation Mother    Diabetes Brother    Heart disease Brother    Hypertension Brother    Diabetes Paternal Grandfather    Stroke Father    Prostate cancer Brother    Hypertension Brother    Healthy Daughter    Diabetes Son    Breast cancer Neg Hx    Social History   Socioeconomic History   Marital status: Divorced    Spouse name: Not on file   Number of children: 2   Years of education: [employer]   Highest education level: 12th grade  Occupational History   Occupation: Retired  Tobacco Use   Smoking status: Former     Packs/day: 0.25    Years: 10.00    Total pack years: 2.50    Types: Cigarettes    Quit date: 10/18/1965    Years since quitting: 56.2   Smokeless tobacco: Never   Tobacco comments:    smoking cessation materials not required  Vaping Use   Vaping Use: Never used  Substance and Sexual Activity   Alcohol use: Not Currently    Alcohol/week: 0.0 standard drinks of alcohol   Drug use: No   Sexual activity: Never  Other Topics Concern   Not on file  Social History Narrative   Pt lives alone; daughter close by   Social Determinants of Health   Financial Resource Strain: Low Risk  (01/10/2021)   Overall Financial Resource Strain (CARDIA)    Difficulty of Paying Living Expenses: Not hard at all  Food Insecurity: No Food Insecurity (01/10/2021)   Hunger Vital Sign    Worried About Running Out of Food in the Last Year: Never true    Ran Out of Food in the Last Year: Never true  Transportation Needs: No Transportation Needs (01/10/2021)   PRAPARE - Hydrologist (Medical): No    Lack of Transportation (Non-Medical): No  Physical Activity: Inactive (01/10/2021)   Exercise Vital Sign    Days of Exercise per Week: 0 days    Minutes of Exercise per Session: 0 min  Stress: Stress Concern Present (01/10/2021)   [place]    Feeling of Stress : To some extent  Social Connections: Socially Isolated (01/10/2021)   Social Connection and Isolation Panel [NHANES]    Frequency of Communication with Friends and Family: More than three times a week    Frequency of Social Gatherings with Friends and Family: More than three times a week    Attends Religious Services: Never    Marine scientist or Organizations: No    Attends Archivist Meetings: Never    Marital Status: Divorced    Tobacco Counseling Counseling given: Not Answered Tobacco comments: smoking cessation materials not  required   Clinical Intake:                 Diabetic?Nutrition Risk Assessment:  Has the patient had any N/V/D within the last 2 months?  {YES/NO:21197} Does the patient have any non-healing wounds?  {YES/NO:21197} Has the patient had any unintentional weight loss or weight gain?  {YES/NO:21197}  Diabetes:  Is the patient diabetic?  {YES/NO:21197} If diabetic, was a CBG obtained today?  {YES/NO:21197} Did the patient bring in their glucometer from home?  {YES/NO:21197} How often do you monitor your CBG's? ***.   Financial Strains and Diabetes Management:  Are you having any financial strains with the  device, your supplies or your medication? {YES/NO:21197}.  Does the patient want to be seen by Chronic Care Management for management of their diabetes?  {YES/NO:21197} Would the patient like to be referred to a Nutritionist or for Diabetic Management?  {YES/NO:21197}  Diabetic Exams:  Diabetic Eye Exam: Completed 07/29/2021 Diabetic Foot Exam: Completed 10/14/2021           Activities of Daily Living    10/14/2021   10:33 AM  In your present state of health, do you have any difficulty performing the following activities:  Hearing? 0  Vision? 0  Difficulty concentrating or making decisions? 0  Walking or climbing stairs? 0  Dressing or bathing? 0  Doing errands, shopping? 0    Patient Care Team: Juline Patch, MD as PCP - General (Family Medicine)  Indicate any recent Medical Services you may have received from other than [facility] providers in the past year (date may be approximate).     Assessment:   This is a routine wellness examination for Angela Pope.  Hearing/Vision screen No results found.  Dietary issues and exercise activities discussed:     Goals Addressed   None    Depression Screen    10/14/2021   10:32 AM 06/16/2021   10:33 AM 02/14/2021    9:33 AM 01/10/2021    8:23 AM 10/11/2020    9:26 AM 06/29/2020    9:23 AM 03/08/2020    9:56 AM   PHQ 2/9 Scores  PHQ - 2 Score 0 0 0 0 0 0 0  PHQ- 9 Score 0 0 0  0 0 0    Fall Risk    10/14/2021   10:38 AM 02/14/2021    9:33 AM 01/10/2021    8:27 AM 06/29/2020    9:22 AM 03/08/2020    9:56 AM  Fall Risk   Falls in the past year? 0 0 0 0 0  Number falls in past yr: 0 0 0    Injury with Fall? 0 0 0    Risk for fall due to : No Fall Risks No Fall Risks No Fall Risks    Follow up Falls evaluation completed Falls evaluation completed Falls prevention discussed Falls evaluation completed Falls evaluation completed    [place]:  Any stairs in or around the home? {YES/NO:21197} If so, are there any without handrails? {YES/NO:21197} Home free of loose throw rugs in walkways, pet beds, electrical cords, etc? {YES/NO:21197} Adequate lighting in your home to reduce risk of falls? {YES/NO:21197}  ASSISTIVE DEVICES UTILIZED TO PREVENT FALLS:  Life alert? {YES/NO:21197} Use of a cane, walker or w/c? {YES/NO:21197} Grab bars in the bathroom? {YES/NO:21197} Shower chair or bench in shower? {YES/NO:21197} Elevated toilet seat or a handicapped toilet? {YES/NO:21197}  TIMED UP AND GO:  Was the test performed? {YES/NO:21197}.  Length of time to ambulate 10 feet: *** sec.   {Appearance of ZOXW:9604540}  Cognitive Function:        05/06/2020    1:30 PM 01/08/2020    8:10 AM 01/07/2020    8:27 AM 01/06/2019    8:37 AM 10/31/2017   11:39 AM  6CIT Screen  What Year? 0 points 0 points 0 points 0 points 4 points  What month? 0 points 0 points 0 points 0 points 0 points  What time? 0 points 0 points 0 points 0 points 0 points  Count back from 20 0 points 0 points 0 points 0 points 0 points  Months in  reverse 0 points 0 points 0 points 0 points 0 points  Repeat phrase 0 points 0 points 0 points 0 points 0 points  Total Score 0 points 0 points 0 points 0 points 4 points    Immunizations Immunization History  Administered Date(s) Administered   Fluad  Quad(high Dose 65+) 02/05/2019, 03/08/2020, 02/14/2021   Influenza, High Dose Seasonal PF 03/18/2018   Influenza,inj,Quad PF,6+ Mos 03/10/2015, 03/21/2016, 03/15/2017   PFIZER Comirnaty(Gray Top)Covid-19 Tri-Sucrose Vaccine 06/29/2019, 07/21/2019, 09/09/2020   PFIZER(Purple Top)SARS-COV-2 Vaccination 06/29/2019, 07/21/2019, 03/10/2020   Pfizer Covid-19 Vaccine Bivalent Booster 38yr & up 02/22/2021   Pneumococcal Conjugate-13 05/29/2009   Pneumococcal Polysaccharide-23 05/29/2010   Td 05/29/2010    TDAP: Postponed  Flu Vaccine status: Up to date  Pneumococcal vaccine status: Up to date  Covid-19 vaccine status: Completed vaccines  Qualifies for Shingles Vaccine? Yes   Zostavax completed Yes   Shingrix Completed?: No.    Education has been provided regarding the importance of this vaccine. Patient has been advised to call insurance company to determine out of pocket expense if they have not yet received this vaccine. Advised may also receive vaccine at local pharmacy or Health Dept. Verbalized acceptance and understanding.  Screening Tests Health Maintenance  Topic Date Due   INFLUENZA VACCINE  12/27/2021   Zoster Vaccines- Shingrix (1 of 2) 01/14/2022 (Originally 03/11/1991)   MAMMOGRAM  10/15/2022 (Originally 04/13/2020)   TETANUS/TDAP  10/15/2022 (Originally 05/29/2020)   HEMOGLOBIN A1C  04/16/2022   OPHTHALMOLOGY EXAM  07/30/2022   FOOT EXAM  10/15/2022   Pneumonia Vaccine 81 Years old  Completed   DEXA SCAN  Completed   COVID-19 Vaccine  Completed   HPV VACCINES  Aged Out    Health Maintenance  Health Maintenance Due  Topic Date Due   INFLUENZA VACCINE  12/27/2021  Colonoscopy: done 07/31/13. No longer required   Mammogram: done 04/14/19. No longer required, aged out.   Bone Density: done 04/14/19  Lung Cancer Screening: (Low Dose CT Chest recommended if Age 366-80years, 30 pack-year currently smoking OR have quit w/in 15years.) does not qualify.   Lung Cancer  Screening Referral: does not qualify   Additional Screening:  Hepatitis C Screening: {DOES NOT does:27190::"does not"} qualify; Completed ***  Vision Screening: Recommended annual ophthalmology exams for early detection of glaucoma and other disorders of the eye. Is the patient up to date with their annual eye exam?  {YES/NO:21197} Who is the provider or what is the name of the office in which the patient attends annual eye exams? *** If pt is not established with a provider, would they like to be referred to a provider to establish care? {YES/NO:21197}.   Dental Screening: Recommended annual dental exams for proper oral hygiene  Community Resource Referral / Chronic Care Management: CRR required this visit?  {YES/NO:21197}  CCM required this visit?  {YES/NO:21197}     Plan:     I have personally reviewed and noted the following in the patient's chart:   Medical and social history Use of alcohol, tobacco or illicit drugs  Current medications and supplements including opioid prescriptions.  Functional ability and status Nutritional status Physical activity Advanced directives List of other physicians Hospitalizations, surgeries, and ER visits in previous 12 months Vitals Screenings to include cognitive, depression, and falls Referrals and appointments  In addition, I have reviewed and discussed with patient certain preventive protocols, quality metrics, and best practice recommendations. A written personalized care plan for preventive services as well as general preventive  health recommendations were provided to patient.     Wilson Singer, [place]   01/10/2022   Nurse Notes: ***

## 2022-01-10 NOTE — Patient Instructions (Signed)

## 2022-01-11 ENCOUNTER — Ambulatory Visit (INDEPENDENT_AMBULATORY_CARE_PROVIDER_SITE_OTHER): Payer: Medicare Other

## 2022-01-11 DIAGNOSIS — Z0000 Encounter for general adult medical examination without abnormal findings: Secondary | ICD-10-CM

## 2022-02-14 ENCOUNTER — Ambulatory Visit: Payer: Medicare Other | Admitting: Family Medicine

## 2022-03-07 ENCOUNTER — Ambulatory Visit (INDEPENDENT_AMBULATORY_CARE_PROVIDER_SITE_OTHER): Payer: Medicare Other | Admitting: Family Medicine

## 2022-03-07 ENCOUNTER — Encounter: Payer: Self-pay | Admitting: Family Medicine

## 2022-03-07 VITALS — BP 122/76 | HR 72 | Ht 65.0 in | Wt 131.0 lb

## 2022-03-07 DIAGNOSIS — E119 Type 2 diabetes mellitus without complications: Secondary | ICD-10-CM

## 2022-03-07 DIAGNOSIS — Z23 Encounter for immunization: Secondary | ICD-10-CM | POA: Diagnosis not present

## 2022-03-07 MED ORDER — METFORMIN HCL 500 MG PO TABS: 500.0000 mg | ORAL_TABLET | Freq: Two times a day (BID) | ORAL | 1 refills | Status: DC

## 2022-03-07 NOTE — Progress Notes (Signed)
Date:  03/07/2022   Name:  Angela Pope   DOB:  25-Jul-1940   MRN:  [id number]   Chief Complaint: Prediabetes and Flu Vaccine  Diabetes She presents for her follow-up diabetic visit. She has type 2 diabetes mellitus. Her disease course has been improving. There are no hypoglycemic associated symptoms. Pertinent negatives for hypoglycemia include no confusion, dizziness, headaches, hunger, mood changes, nervousness/anxiousness, pallor, seizures, sleepiness, speech difficulty, sweats or tremors. There are no diabetic associated symptoms. Pertinent negatives for diabetes include no chest pain, no polydipsia and no polyuria. There are no hypoglycemic complications. Symptoms are stable. There are no diabetic complications. Risk factors for coronary artery disease include dyslipidemia and hypertension. Current diabetic treatment includes oral agent (monotherapy).    Lab Results  Component Value Date   NA 143 10/14/2021   K 3.3 (L) 10/14/2021   CO2 25 10/14/2021   GLUCOSE 108 (H) 10/14/2021   BUN 8 10/14/2021   CREATININE 0.73 10/14/2021   CALCIUM 9.7 10/14/2021   EGFR 83 10/14/2021   GFRNONAA 77 06/29/2020   Lab Results  Component Value Date   CHOL 150 06/16/2021   HDL 58 06/16/2021   LDLCALC 76 06/16/2021   TRIG 83 06/16/2021   CHOLHDL 3.4 03/27/2018   Lab Results  Component Value Date   TSH 1.260 03/27/2017   Lab Results  Component Value Date   HGBA1C 5.3 10/14/2021   Lab Results  Component Value Date   WBC 6.2 11/03/2019   HGB 15.8 11/03/2019   HCT 48.6 (H) 11/03/2019   MCV 94 11/03/2019   PLT 306 11/03/2019   Lab Results  Component Value Date   ALT 13 10/14/2021   AST 19 10/14/2021   ALKPHOS 54 10/14/2021   BILITOT 1.5 (H) 10/14/2021   Lab Results  Component Value Date   VD25OH 32.3 06/18/2017     Review of Systems  Constitutional:  Negative for chills and fever.  HENT:  Negative for drooling, ear discharge, ear pain and sore throat.   Respiratory:   Negative for cough, shortness of breath and wheezing.   Cardiovascular:  Negative for chest pain, palpitations and leg swelling.  Gastrointestinal:  Negative for abdominal pain, blood in stool, constipation, diarrhea and nausea.  Endocrine: Negative for polydipsia and polyuria.  Genitourinary:  Negative for dysuria, frequency, hematuria and urgency.  Musculoskeletal:  Negative for back pain, myalgias and neck pain.  Skin:  Negative for pallor and rash.  Allergic/Immunologic: Negative for environmental allergies.  Neurological:  Negative for dizziness, tremors, seizures, speech difficulty and headaches.  Hematological:  Does not bruise/bleed easily.  Psychiatric/Behavioral:  Negative for confusion and suicidal ideas. The patient is not nervous/anxious.     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   Bilateral lower extremity edema 01/14/2015   Essential hypertension 01/13/2015   Diabetes mellitus type 2, controlled, without complications ([place]) 75/88/3254   Hyperlipidemia 01/13/2015   Overweight (BMI 25.0-29.9) 01/13/2015   Allergic rhinitis 01/13/2015   Hx of colonic polyps 01/13/2015   Hx of resection of large bowel 01/13/2015   Chronic right hip pain 01/13/2015   Vitamin D deficiency 01/13/2015   FH: heart disease 01/13/2015   FH: stroke 01/13/2015   Incomplete emptying of bladder 07/02/2009   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    Allergies  Allergen Reactions   Sulfa Antibiotics    Tetracyclines &  Related    Clarithromycin Rash    Past Surgical History:  Procedure Laterality Date   COLONOSCOPY  2015   large intestine surgery     SMALL INTESTINE SURGERY  2012   polyp that could't be removed    Social History   Tobacco Use   Smoking status: Former    Years: 10.00    Types: Cigarettes    Quit date: 10/18/1965    Years since  quitting: 56.4   Smokeless tobacco: Never   Tobacco comments:    smoking cessation materials not required  Vaping Use   Vaping Use: Never used  Substance Use Topics   Alcohol use: Yes    Comment: occasional once a twice a month   Drug use: No     Medication list has been reviewed and updated.  Current Meds  Medication Sig   Calcium Carb-Cholecalciferol (CALCIUM 1000 + D) 1000-800 MG-UNIT TABS Take 1 tablet by mouth daily.   glucose blood test strip Use to check Blood Sugar 3 times daily for ICD10: E11.9(One touch ultra blue brand. )   hydrochlorothiazide (HYDRODIURIL) 25 MG tablet TAKE 1 TABLET(25 MG) BY MOUTH DAILY   Lancets (ONETOUCH ULTRASOFT) lancets Use to check Blood Sugar 3 times daily for ICD 10:  E11.9  (Touch Twist Lancets for Ultra Touch)   loperamide (IMODIUM) 2 MG capsule Take 2 mg by mouth as needed for diarrhea or loose stools.   losartan (COZAAR) 100 MG tablet TAKE 1 TABLET(100 MG) BY MOUTH DAILY   metFORMIN (GLUCOPHAGE) 500 MG tablet Take 1 tablet (500 mg total) by mouth 2 (two) times daily with a meal.   Multiple Vitamin (MULTIVITAMIN) capsule Take 1 capsule by mouth daily.   potassium chloride (KLOR-CON) 10 MEQ tablet Take 1 tablet (10 mEq total) by mouth daily.   simvastatin (ZOCOR) 40 MG tablet TAKE 1 TABLET(40 MG) BY MOUTH DAILY   vitamin E 180 MG (400 UNITS) capsule Take 400 Units by mouth daily.       03/07/2022   10:42 AM 10/14/2021   10:32 AM 06/16/2021   10:33 AM 02/14/2021    9:33 AM  GAD 7 : Generalized Anxiety Score  Nervous, Anxious, on Edge 0 0 0 0  Control/stop worrying 0 0 0 0  Worry too much - different things 0 0 0 0  Trouble relaxing 0 0 0 0  Restless 0 0 0 0  Easily annoyed or irritable 0 0 0 0  Afraid - awful might happen 0 0 0 0  Total GAD 7 Score 0 0 0 0  Anxiety Difficulty Not difficult at all Not difficult at all Not difficult at all        03/07/2022   10:42 AM 01/11/2022    8:17 AM 10/14/2021   10:32 AM  Depression screen  PHQ 2/9  Decreased Interest 0 1 0  Down, Depressed, Hopeless 0 1 0  PHQ - 2 Score 0 2 0  Altered sleeping 0 1 0  Tired, decreased energy 0 0 0  Change in appetite 0 0 0  Feeling bad or failure about yourself  0 0 0  Trouble concentrating 0 0 0  Moving slowly or fidgety/restless 0 0 0  Suicidal thoughts 0 0 0  PHQ-9 Score 0 3 0  Difficult doing work/chores Not difficult at all Not difficult at all Not difficult at all    BP Readings from Last 3 Encounters:  03/07/22 122/76  10/14/21 120/78  06/16/21 120/80  Physical Exam Vitals and nursing note reviewed.  HENT:     Right Ear: Ear canal normal.     Left Ear: Ear canal normal.     Nose: Nose normal.     Mouth/Throat:     Mouth: Mucous membranes are moist.  Eyes:     Pupils: Pupils are equal, round, and reactive to light.  Neck:     Vascular: No carotid bruit.  Cardiovascular:     Rate and Rhythm: Normal rate.     Heart sounds: No murmur heard.    No gallop.  Pulmonary:     Effort: Pulmonary effort is normal.     Breath sounds: Normal breath sounds. No wheezing, rhonchi or rales.  Abdominal:     Tenderness: There is no abdominal tenderness.  Lymphadenopathy:     Cervical: No cervical adenopathy.  Neurological:     Mental Status: She is alert.     Wt Readings from Last 3 Encounters:  03/07/22 131 lb (59.4 kg)  10/14/21 131 lb (59.4 kg)  06/16/21 131 lb (59.4 kg)    BP 122/76   Pulse 72   Ht _0  (1.651 m)   Wt 131 lb (59.4 kg)   BMI 21.80 kg/m   Assessment and Plan:  1. Controlled type 2 diabetes mellitus without complication, without long-term current use of insulin (HCC) Chronic.  Controlled.  Stable.  Will check A1c for current level of control and will continue metformin 500 mg twice a day.  We will recheck in 6 months.  At this point time we will check an A1c as well as microalbuminuria. - HgB A1c - metFORMIN (GLUCOPHAGE) 500 MG tablet; Take 1 tablet (500 mg total) by mouth 2 (two) times daily  with a meal.  Dispense: 180 tablet; Refill: 1 - Microalbumin / creatinine urine ratio  2. Need for immunization against influenza Discussed and administered - Flu Vaccine QUAD High Dose(Fluad)    Otilio Miu, MD

## 2022-03-08 LAB — HEMOGLOBIN A1C
Est. average glucose Bld gHb Est-mCnc: 111 mg/dL
Hgb A1c MFr Bld: 5.5 % (ref 4.8–5.6)

## 2022-03-08 LAB — MICROALBUMIN / CREATININE URINE RATIO
Creatinine, Urine: 98.2 mg/dL
Microalb/Creat Ratio: 35 mg/g creat — ABNORMAL HIGH (ref 0–29)
Microalbumin, Urine: 34.3 ug/mL

## 2022-03-09 ENCOUNTER — Other Ambulatory Visit: Payer: Self-pay | Admitting: Family Medicine

## 2022-03-09 ENCOUNTER — Ambulatory Visit: Payer: Medicare Other

## 2022-03-09 DIAGNOSIS — E876 Hypokalemia: Secondary | ICD-10-CM

## 2022-03-09 NOTE — Telephone Encounter (Signed)
Requested Prescriptions  Pending Prescriptions Disp Refills  . potassium chloride (KLOR-CON) 10 MEQ tablet [Pharmacy Med Name: POTASSIUM CL 10MEQ ER TABLETS] 90 tablet 1    Sig: TAKE 1 TABLET(10 MEQ) BY MOUTH DAILY     Endocrinology:  Minerals - Potassium Supplementation Failed - 03/09/2022  8:16 AM      Failed - K in normal range and within 360 days    Potassium  Date Value Ref Range Status  10/14/2021 3.3 (L) 3.5 - 5.2 mmol/L Final         Passed - Cr in normal range and within 360 days    Creatinine, Ser  Date Value Ref Range Status  10/14/2021 0.73 0.57 - 1.00 mg/dL Final         Passed - Valid encounter within last 12 months    Recent Outpatient Visits          2 days ago Need for immunization against influenza   [facility] [place], Deanna C, MD   4 months ago Essential hypertension   [facility] [place], Deanna C, MD   8 months ago Essential hypertension   [facility] [place], Deanna C, MD   1 year ago Essential hypertension   [facility] [place], [place], MD   1 year ago Controlled type 2 diabetes mellitus without complication, without long-term current use of insulin ([place])   [facility] [place], Ali Molina, MD      Future Appointments            In 6 months Juline Patch, MD [facility] at [facility], [facility]

## 2022-03-16 ENCOUNTER — Other Ambulatory Visit: Payer: Self-pay | Admitting: Family Medicine

## 2022-03-16 DIAGNOSIS — E876 Hypokalemia: Secondary | ICD-10-CM

## 2022-03-16 MED ORDER — POTASSIUM CHLORIDE ER 10 MEQ PO TBCR: EXTENDED_RELEASE_TABLET | ORAL | 1 refills | Status: DC

## 2022-03-16 NOTE — Telephone Encounter (Signed)
Donora called and spoke to [place], [facility] about the refill(s) KCL requested. Advised it was sent on 03/09/22 #90/1 refill(s). He states that they did not receive rx request. Advised I would resend so pt can come pu.   Requested Prescriptions  Pending Prescriptions Disp Refills   potassium chloride (KLOR-CON) 10 MEQ tablet 90 tablet 1    Sig: TAKE 1 TABLET(10 MEQ) BY MOUTH DAILY     Endocrinology:  Minerals - Potassium Supplementation Failed - 03/16/2022  2:58 PM      Failed - K in normal range and within 360 days    Potassium  Date Value Ref Range Status  10/14/2021 3.3 (L) 3.5 - 5.2 mmol/L Final         Passed - Cr in normal range and within 360 days    Creatinine, Ser  Date Value Ref Range Status  10/14/2021 0.73 0.57 - 1.00 mg/dL Final         Passed - Valid encounter within last 12 months    Recent Outpatient Visits           1 week ago Need for immunization against influenza   [facility] [place], [place], MD   5 months ago Essential hypertension   [facility] [place], Deanna C, MD   9 months ago Essential hypertension   [facility] [place], Deanna C, MD   1 year ago Essential hypertension   [facility] [place], [place], MD   1 year ago Controlled type 2 diabetes mellitus without complication, without long-term current use of insulin ([place])   [facility] [place], [place], MD       Future Appointments             In 5 months Juline Patch, MD [place] [facility] at [facility], [facility]

## 2022-03-16 NOTE — Telephone Encounter (Signed)
Copied from [place] [id number]. Topic: General - Other >> Mar 16, 2022  2:28 PM Everette C wrote: Reason for CRM: Medication Refill - Medication: potassium chloride (KLOR-CON) 10 MEQ tablet [[id number]]   Has the patient contacted their pharmacy? Yes.  The patient has been directed to contact their PCP  (Agent: If no, request that the patient contact the pharmacy for the refill. If patient does not wish to contact the pharmacy document the reason why and proceed with request.) (Agent: If yes, when and what did the pharmacy advise?)  Preferred Pharmacy (with phone number or street name): [facility], [address] AT [place] [place] [address] Phone: [phone] Fax: [phone] Hours: Not open 24 hours   Has the patient been seen for an appointment in the last year OR does the patient have an upcoming appointment? Yes.    Agent: Please be advised that RX refills may take up to 3 business days. We ask that you follow-up with your pharmacy.

## 2022-05-11 ENCOUNTER — Telehealth: Payer: Self-pay | Admitting: Family Medicine

## 2022-05-11 NOTE — Telephone Encounter (Signed)
Spoke to pt told her that she can come in tomorrow to get the covid vaccine. Pt stated she will come in tomorrow morning.  KP

## 2022-05-11 NOTE — Telephone Encounter (Signed)
Copied from [place] [id number]. Topic: General - Other >> May 11, 2022 12:00 PM Angela Pope wrote: Patient states that she received a bulletin from [facility] stating that she needs to receive the latest Covid booster, but is concerned because she was told by pcp at last visit that she did not need anymore vaccines for Covid. Patient would like a further explanation as to why she should or should not receive the latest booster. Please advise patient.

## 2022-05-12 ENCOUNTER — Ambulatory Visit (INDEPENDENT_AMBULATORY_CARE_PROVIDER_SITE_OTHER): Payer: Medicare Other

## 2022-05-12 DIAGNOSIS — Z23 Encounter for immunization: Secondary | ICD-10-CM | POA: Diagnosis not present

## 2022-07-26 ENCOUNTER — Ambulatory Visit: Payer: Self-pay | Admitting: *Deleted

## 2022-07-26 NOTE — Telephone Encounter (Signed)
Called pt left VM to call back. Pt can get both vaccines if she wants to get it.  KP

## 2022-07-26 NOTE — Telephone Encounter (Signed)
Patient called and given advice ok to receive both vaccines  She says [facility] doesn't have record of her receiving the COVID vaccines and she knows she has received them all. I advised per her immunization record of the dates she received them. She asks if this could be printed and mailed to her home address on file. She doesn't have the [place] immunization card anymore. I advised I will send this to be printed and mailed for her.

## 2022-07-26 NOTE — Telephone Encounter (Signed)
  Chief Complaint: Has questions about Covid shot which she is scheduled to get at the pharmacy on 08/02/2022.   Should she get it? Also has questions about the shots she is seeing advertised on TV for [place].   Should she get them too? Symptoms: None Frequency: N/A Pertinent Negatives: Patient denies N/A Disposition: []$ ED /[]$ Urgent Care (no appt availability in office) / []$ Appointment(In office/virtual)/ []$  [place] [facility]/ []$ Home Care/ []$ Refused Recommended Disposition /[]$ [place] [facility]/ [x]$  Follow-up with PCP Additional Notes: I'm sending a message to Dr. Ronnald Ramp.  Pt agreeable to someone calling her back.

## 2022-07-26 NOTE — Telephone Encounter (Signed)
Message from Oneta Rack sent at 07/26/2022 11:04 AM EST  Summary: COVID Vaccine   Patient inquiring about the 3rd shot COVID vaccine and was told its for a specific population. Patient states she is very concerned about getting COVID.          Call History   Type Contact Phone/Fax User  07/26/2022 11:00 AM EST Phone ([address]) [place], [place] (Self) [phone] Jerilynn Mages) Oneta Rack   Reason for Disposition . COVID-19 vaccine, Frequently Asked Questions (FAQs)  Protocols used: Coronavirus (COVID-19) Vaccine Questions and Reactions-A-AH

## 2022-08-08 DIAGNOSIS — E119 Type 2 diabetes mellitus without complications: Secondary | ICD-10-CM | POA: Diagnosis not present

## 2022-08-08 DIAGNOSIS — H40003 Preglaucoma, unspecified, bilateral: Secondary | ICD-10-CM | POA: Diagnosis not present

## 2022-08-08 DIAGNOSIS — H2513 Age-related nuclear cataract, bilateral: Secondary | ICD-10-CM | POA: Diagnosis not present

## 2022-08-08 LAB — HM DIABETES EYE EXAM

## 2022-09-06 ENCOUNTER — Ambulatory Visit (INDEPENDENT_AMBULATORY_CARE_PROVIDER_SITE_OTHER): Payer: Medicare Other | Admitting: Family Medicine

## 2022-09-06 ENCOUNTER — Encounter: Payer: Self-pay | Admitting: Family Medicine

## 2022-09-06 VITALS — BP 120/76 | HR 84 | Ht 65.0 in | Wt 124.0 lb

## 2022-09-06 DIAGNOSIS — E876 Hypokalemia: Secondary | ICD-10-CM | POA: Diagnosis not present

## 2022-09-06 DIAGNOSIS — E782 Mixed hyperlipidemia: Secondary | ICD-10-CM

## 2022-09-06 DIAGNOSIS — I10 Essential (primary) hypertension: Secondary | ICD-10-CM | POA: Diagnosis not present

## 2022-09-06 DIAGNOSIS — E119 Type 2 diabetes mellitus without complications: Secondary | ICD-10-CM

## 2022-09-06 MED ORDER — HYDROCHLOROTHIAZIDE 25 MG PO TABS: ORAL_TABLET | ORAL | 1 refills | Status: DC

## 2022-09-06 MED ORDER — LOSARTAN POTASSIUM 100 MG PO TABS: ORAL_TABLET | ORAL | 1 refills | Status: DC

## 2022-09-06 MED ORDER — POTASSIUM CHLORIDE ER 10 MEQ PO TBCR: EXTENDED_RELEASE_TABLET | ORAL | 1 refills | Status: DC

## 2022-09-06 MED ORDER — METFORMIN HCL 500 MG PO TABS: 500.0000 mg | ORAL_TABLET | Freq: Two times a day (BID) | ORAL | 1 refills | Status: DC

## 2022-09-06 MED ORDER — SIMVASTATIN 40 MG PO TABS: ORAL_TABLET | ORAL | 1 refills | Status: DC

## 2022-09-06 NOTE — Progress Notes (Signed)
Date:  09/06/2022   Name:  Angela Pope   DOB:  [birth year]   MRN:  [id number]   Chief Complaint: Diabetes, hypokalemia, Hyperlipidemia, and Hypertension  Diabetes She presents for her follow-up diabetic visit. She has type 2 diabetes mellitus. Her disease course has been stable. There are no hypoglycemic associated symptoms. There are no diabetic associated symptoms. Pertinent negatives for diabetes include no blurred vision and no chest pain. There are no hypoglycemic complications. Symptoms are stable. There are no diabetic complications. Risk factors for coronary artery disease include dyslipidemia. Current diabetic treatment includes oral agent (monotherapy). Her weight is stable. She is following a generally healthy diet. She participates in exercise daily. An ACE inhibitor/angiotensin II receptor blocker is being taken.  Hyperlipidemia This is a chronic problem. The current episode started more than 1 year ago. The problem is controlled. Recent lipid tests were reviewed and are normal. Pertinent negatives include no chest pain or shortness of breath. Current antihyperlipidemic treatment includes statins. The current treatment provides moderate improvement of lipids. There are no compliance problems.   Hypertension This is a chronic problem. The current episode started more than 1 year ago. The problem has been gradually improving since onset. The problem is controlled. Pertinent negatives include no blurred vision, chest pain, palpitations, peripheral edema or shortness of breath. Risk factors for coronary artery disease include dyslipidemia.    Lab Results  Component Value Date   NA 143 10/14/2021   K 3.3 (L) 10/14/2021   CO2 25 10/14/2021   GLUCOSE 108 (H) 10/14/2021   BUN 8 10/14/2021   CREATININE 0.73 10/14/2021   CALCIUM 9.7 10/14/2021   EGFR 83 10/14/2021   GFRNONAA 77 06/29/2020   Lab Results  Component Value Date   CHOL 150 06/16/2021   HDL 58 06/16/2021   LDLCALC  76 06/16/2021   TRIG 83 06/16/2021   CHOLHDL 3.4 03/27/2018   Lab Results  Component Value Date   TSH 1.260 03/27/2017   Lab Results  Component Value Date   HGBA1C 5.5 03/07/2022   Lab Results  Component Value Date   WBC 6.2 11/03/2019   HGB 15.8 11/03/2019   HCT 48.6 (H) 11/03/2019   MCV 94 11/03/2019   PLT 306 11/03/2019   Lab Results  Component Value Date   ALT 13 10/14/2021   AST 19 10/14/2021   ALKPHOS 54 10/14/2021   BILITOT 1.5 (H) 10/14/2021   Lab Results  Component Value Date   VD25OH 32.3 06/18/2017     Review of Systems  Constitutional:  Negative for diaphoresis and fever.  HENT:  Negative for drooling, facial swelling, mouth sores, nosebleeds, postnasal drip, sinus pressure, sore throat and trouble swallowing.   Eyes:  Negative for blurred vision and visual disturbance.  Respiratory:  Negative for cough, chest tightness, shortness of breath, wheezing and stridor.   Cardiovascular:  Negative for chest pain and palpitations.  Gastrointestinal:  Negative for abdominal distention.    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/2015   Chronic right hip pain 01/13/2015   Vitamin D  deficiency 01/13/2015   FH: heart disease 01/13/2015   FH: stroke 01/13/2015   Incomplete emptying of bladder 07/02/2009   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    Allergies  Allergen Reactions   Sulfa Antibiotics    Tetracyclines & Related    Clarithromycin Rash    Past Surgical History:  Procedure  Laterality Date   COLONOSCOPY  2015   large intestine surgery     SMALL INTESTINE SURGERY  2012   polyp that could't be removed    Social History   Tobacco Use   Smoking status: Former    Years: 10    Types: Cigarettes    Quit date: 10/18/1965    Years since quitting: 56.9   Smokeless tobacco: Never   Tobacco comments:    smoking cessation materials not required  Vaping Use   Vaping Use: Never used  Substance Use Topics   Alcohol use: Yes    Comment: occasional once a twice a month   Drug use: No     Medication list has been reviewed and updated.  Current Meds  Medication Sig   Calcium Carb-Cholecalciferol (CALCIUM 1000 + D) 1000-800 MG-UNIT TABS Take 1 tablet by mouth daily.   glucose blood test strip Use to check Blood Sugar 3 times daily for ICD10: E11.9(One touch ultra blue brand. )   hydrochlorothiazide (HYDRODIURIL) 25 MG tablet TAKE 1 TABLET(25 MG) BY MOUTH DAILY   Lancets (ONETOUCH ULTRASOFT) lancets Use to check Blood Sugar 3 times daily for ICD 10:  E11.9  (Touch Twist Lancets for Ultra Touch)   loperamide (IMODIUM) 2 MG capsule Take 2 mg by mouth as needed for diarrhea or loose stools.   losartan (COZAAR) 100 MG tablet TAKE 1 TABLET(100 MG) BY MOUTH DAILY   metFORMIN (GLUCOPHAGE) 500 MG tablet Take 1 tablet (500 mg total) by mouth 2 (two) times daily with a meal.   Multiple Vitamin (MULTIVITAMIN) capsule Take 1 capsule by mouth daily.   potassium chloride (KLOR-CON) 10 MEQ tablet TAKE 1 TABLET(10 MEQ) BY MOUTH DAILY   simvastatin (ZOCOR) 40 MG tablet TAKE 1 TABLET(40 MG) BY MOUTH DAILY   vitamin E 180 MG (400 UNITS) capsule Take 400 Units by mouth daily.       09/06/2022   10:32 AM 03/07/2022   10:42 AM 10/14/2021   10:32 AM 06/16/2021   10:33 AM  GAD 7 : Generalized Anxiety Score  Nervous, Anxious, on Edge 0 0 0 0  Control/stop worrying 0 0 0 0  Worry too much - different things 0 0 0 0  Trouble relaxing 0 0 0 0  Restless 0 0 0 0  Easily annoyed or  irritable 0 0 0 0  Afraid - awful might happen 0 0 0 0  Total GAD 7 Score 0 0 0 0  Anxiety Difficulty Not difficult at all Not difficult at all Not difficult at all Not difficult at all       09/06/2022   10:32 AM 03/07/2022   10:42 AM 01/11/2022    8:17 AM  Depression screen PHQ 2/9  Decreased Interest 0 0 1  Down, Depressed, Hopeless 0 0 1  PHQ - 2 Score 0 0 2  Altered sleeping 0 0 1  Tired, decreased energy 0 0 0  Change in appetite 0 0 0  Feeling bad or failure about yourself  0 0 0  Trouble concentrating 0 0 0  Moving slowly or fidgety/restless 0 0 0  Suicidal thoughts 0  0 0  PHQ-9 Score 0 0 3  Difficult doing work/chores Not difficult at all Not difficult at all Not difficult at all    BP Readings from Last 3 Encounters:  09/06/22 120/76  03/07/22 122/76  10/14/21 120/78    Physical Exam Vitals and nursing note reviewed. Exam conducted with a chaperone present.  Constitutional:      General: She is not in acute distress.    Appearance: She is not diaphoretic.  HENT:     Head: Normocephalic and atraumatic.     Right Ear: Tympanic membrane and external ear normal.     Left Ear: Tympanic membrane and external ear normal.     Nose: Nose normal.     Mouth/Throat:     Mouth: Mucous membranes are moist.  Eyes:     General:        Right eye: No discharge.        Left eye: No discharge.     Conjunctiva/sclera: Conjunctivae normal.     Pupils: Pupils are equal, round, and reactive to light.  Neck:     Thyroid: No thyromegaly.     Vascular: No carotid bruit or JVD.  Cardiovascular:     Rate and Rhythm: Normal rate and regular rhythm.     Heart sounds: Normal heart sounds. No murmur heard.    No friction rub. No gallop.  Pulmonary:     Effort: Pulmonary effort is normal.     Breath sounds: Normal breath sounds. No wheezing, rhonchi or rales.  Abdominal:     General: Bowel sounds are normal.     Palpations: Abdomen is soft. There is no mass.     Tenderness: There  is no abdominal tenderness. There is no guarding or rebound.  Musculoskeletal:        General: No deformity. Normal range of motion.     Cervical back: Normal range of motion and neck supple.  Lymphadenopathy:     Cervical: No cervical adenopathy.  Skin:    General: Skin is warm and dry.  Neurological:     Mental Status: She is alert.     Deep Tendon Reflexes: Reflexes are normal and symmetric.     Wt Readings from Last 3 Encounters:  09/06/22 124 lb (56.2 kg)  03/07/22 131 lb (59.4 kg)  10/14/21 131 lb (59.4 kg)    BP 120/76   Pulse 84   Ht 5\' 5"  (1.651 m)   Wt 124 lb (56.2 kg)   SpO2 95%   BMI 20.63 kg/m   Assessment and Plan: 1. Essential hypertension Chronic.  Controlled.  Stable.  Blood pressure today is 120/76.  Asymptomatic.  Tolerating medications well.  Continue hydrochlorothiazide 25 mg and losartan 100 mg once a day.  Will check CMP for electrolytes and GFR.  Will recheck in 4 months. - hydrochlorothiazide (HYDRODIURIL) 25 MG tablet; TAKE 1 TABLET(25 MG) BY MOUTH DAILY  Dispense: 90 tablet; Refill: 1 - losartan (COZAAR) 100 MG tablet; TAKE 1 TABLET(100 MG) BY MOUTH DAILY  Dispense: 90 tablet; Refill: 1 - Comprehensive Metabolic Panel (CMET)  2. Controlled type 2 diabetes mellitus without complication, without long-term current use of insulin .  Controlled.  Stable.  Continue metformin 500 mg twice a day.  Last A1c was 5.5.  This is excellent control but will recheck her A1c. - metFORMIN (GLUCOPHAGE) 500 MG tablet; Take 1 tablet (500 mg total) by mouth 2 (two) times daily with a meal.  Dispense: 180 tablet; Refill: 1 -  HgB A1c  3. Hypokalemia Chronic.  Controlled.  Stable.  Supplemental potassium of 10 mEq most likely of an iatrogenic nature/hydrochlorothiazide on a daily basis. - potassium chloride (KLOR-CON) 10 MEQ tablet; TAKE 1 TABLET(10 MEQ) BY MOUTH DAILY  Dispense: 90 tablet; Refill: 1  4. Mixed hyperlipidemia Chronic.  Controlled.  Stable.  Continue  simvastatin 40 mg once a day.  Will check lipid panel. - simvastatin (ZOCOR) 40 MG tablet; TAKE 1 TABLET(40 MG) BY MOUTH DAILY  Dispense: 90 tablet; Refill: 1 - Lipid Panel With LDL/HDL Ratio     Elizabeth Sauer, MD

## 2022-09-07 ENCOUNTER — Telehealth: Payer: Self-pay | Admitting: Family Medicine

## 2022-09-07 LAB — COMPREHENSIVE METABOLIC PANEL
ALT: 11 IU/L (ref 0–32)
AST: 15 IU/L (ref 0–40)
Albumin/Globulin Ratio: 1.8 (ref 1.2–2.2)
Albumin: 4.2 g/dL (ref 3.7–4.7)
Alkaline Phosphatase: 56 IU/L (ref 44–121)
BUN/Creatinine Ratio: 21 (ref 12–28)
BUN: 14 mg/dL (ref 8–27)
Bilirubin Total: 1.8 mg/dL — ABNORMAL HIGH (ref 0.0–1.2)
CO2: 22 mmol/L (ref 20–29)
Calcium: 10 mg/dL (ref 8.7–10.3)
Chloride: 102 mmol/L (ref 96–106)
Creatinine, Ser: 0.68 mg/dL (ref 0.57–1.00)
Globulin, Total: 2.3 g/dL (ref 1.5–4.5)
Glucose: 130 mg/dL — ABNORMAL HIGH (ref 70–99)
Potassium: 4 mmol/L (ref 3.5–5.2)
Sodium: 141 mmol/L (ref 134–144)
Total Protein: 6.5 g/dL (ref 6.0–8.5)
eGFR: 87 mL/min/{1.73_m2} (ref >59)

## 2022-09-07 LAB — LIPID PANEL WITH LDL/HDL RATIO
Cholesterol, Total: 161 mg/dL (ref 100–199)
HDL: 59 mg/dL (ref >39)
LDL Chol Calc (NIH): 85 mg/dL (ref 0–99)
LDL/HDL Ratio: 1.4 ratio (ref 0.0–3.2)
Triglycerides: 92 mg/dL (ref 0–149)
VLDL Cholesterol Cal: 17 mg/dL (ref 5–40)

## 2022-09-07 LAB — HEMOGLOBIN A1C
Est. average glucose Bld gHb Est-mCnc: 111 mg/dL
Hgb A1c MFr Bld: 5.5 % (ref 4.8–5.6)

## 2022-09-07 NOTE — Telephone Encounter (Signed)
Copied from CRM [id number]. Topic: General - Other >> Sep 07, 2022  3:47 PM Dominique A wrote: Reason for CRM: Per TE pt daughter was wanting a call back to discuss pt appt that she had yesterday. Pt received the call back on her phone instead of her daughter. Pt is upset. Please advise.

## 2022-09-07 NOTE — Telephone Encounter (Signed)
Called pt she stated that she will talk to her daughter about her office visit that we do not need to call her back.  KP

## 2022-09-07 NOTE — Telephone Encounter (Signed)
Copied from CRM [id number]. Topic: General - Other >> Sep 07, 2022  2:37 PM Everette C wrote: Reason for CRM: The patient's daughter would like to be contacted by a member of staff regarding the patient's visit yesterday 09/06/22  Please contact further when possible

## 2022-09-07 NOTE — Telephone Encounter (Signed)
Called pts daughter Denny Peon left VM to call back.  KP

## 2022-09-07 NOTE — Telephone Encounter (Signed)
Copied from CRM [id number]. Topic: General - Other >> Sep 07, 2022  4:31 PM Dominique A wrote: Reason for CRM: Pt daughter Denny Peon is calling back. Per Denny Peon she missed a phone call from the office regarding her mothers office visit on yesterday 09/06/22. Please call Erin back.

## 2022-09-07 NOTE — Telephone Encounter (Signed)
Spoke to pt she stated she would talk to her daughter about her office visit.  KP

## 2022-10-09 ENCOUNTER — Ambulatory Visit: Payer: Self-pay | Admitting: *Deleted

## 2022-10-09 ENCOUNTER — Telehealth: Payer: Self-pay | Admitting: Family Medicine

## 2022-10-09 NOTE — Telephone Encounter (Signed)
Copied from CRM [id number]. Topic: General - Other >> Oct 09, 2022  8:32 AM Pincus Sanes wrote: Reason for CRM: Pt is very perturbed as has gotten a jury summons and can not believe that she is being expected to drive to [place] as she has never driven that far in her life. She states she is 43 and extremely upset that the government is expecting her to do so. I told her that at 81 if she will call them they should dismiss her due to age without questioning her but she says regardless she wants a detailed [place] medications, the problems that would arise so she there will be no misunderstanding with the courts. She is very upset. She states no matter what she wants a detailed lette.  Pls advise pt.  [phone]

## 2022-10-09 NOTE — Telephone Encounter (Signed)
Summary: Pt confused about what meds she is on are for   PT needs a FU call re she does not know what her medications are for. Pt states on lots of medications and should be told what they are for. Please call and advise pt. [phone]       Chief Complaint: medication questions Symptoms: no Frequency: na Pertinent Negatives: Patient denies na Disposition: [] ED /[] Urgent Care (no appt availability in office) / [] Appointment(In office/virtual)/ []  [facility]/ [x] Home Care/ [] Refused Recommended Disposition /[] [facility]/ []  Follow-up with PCP Additional Notes:   Reviewed medications and rationale. Reviewed zocor for cholesterol management, hydrochlorothiazide and cozaar for blood pressure. Patient reports she is aware of all other prescribed medications.      Reason for Disposition  Caller has medicine question only, adult not sick, AND triager answers question  Answer Assessment - Initial Assessment Questions 1. NAME of MEDICINE: "What medicine(s) are you calling about?"     All current medications mostly , hydrodiuril, cozaar, zocor 2. QUESTION: "What is your question?" (e.g., double dose of medicine, side effect)     What are these medications for? 3. PRESCRIBER: "Who prescribed the medicine?" Reason: if prescribed by specialist, call should be referred to that group.     PCP 4. SYMPTOMS: "Do you have any symptoms?" If Yes, ask: "What symptoms are you having?"  "How bad are the symptoms (e.g., mild, moderate, severe)     No  5. PREGNANCY:  "Is there any chance that you are pregnant?" "When was your last menstrual period?"     na  Protocols used: Medication Question Call-A-AH

## 2023-01-02 ENCOUNTER — Telehealth: Payer: Self-pay | Admitting: Family Medicine

## 2023-01-02 NOTE — Telephone Encounter (Signed)
Copied from CRM [id number]. Topic: Medicare AWV >> Jan 02, 2023  3:21 PM Payton Doughty wrote: Reason for CRM: LVM 01/02/23 to r/s AWV appt. New appt date 01/17/2023 at 11:30am. Pleaser confirm new appt date and time. Khc  Verlee Rossetti; Care Guide Ambulatory Clinical Support [place] [facility] Direct Dial: [phone]

## 2023-01-17 ENCOUNTER — Ambulatory Visit (INDEPENDENT_AMBULATORY_CARE_PROVIDER_SITE_OTHER): Payer: Medicare Other

## 2023-01-17 ENCOUNTER — Other Ambulatory Visit: Payer: Self-pay

## 2023-01-17 VITALS — Ht 65.0 in | Wt 122.0 lb

## 2023-01-17 DIAGNOSIS — Z0000 Encounter for general adult medical examination without abnormal findings: Secondary | ICD-10-CM

## 2023-01-17 DIAGNOSIS — E876 Hypokalemia: Secondary | ICD-10-CM

## 2023-01-17 DIAGNOSIS — I10 Essential (primary) hypertension: Secondary | ICD-10-CM

## 2023-01-17 MED ORDER — POTASSIUM CHLORIDE ER 10 MEQ PO TBCR: EXTENDED_RELEASE_TABLET | ORAL | 0 refills | Status: DC

## 2023-01-17 MED ORDER — LOSARTAN POTASSIUM 100 MG PO TABS: ORAL_TABLET | ORAL | 0 refills | Status: DC

## 2023-01-17 NOTE — Patient Instructions (Signed)
Angela Pope , Thank you for taking time to come for your Medicare Wellness Visit. I appreciate your ongoing commitment to your health goals. Please review the following plan we discussed and let me know if I can assist you in the future.   These are the goals we discussed:  Goals       Activity and Exercise Increased      Evidence-based guidance:  Review current exercise levels.  Assess patient perspective on exercise or activity level, barriers to increasing activity, motivation and readiness for change.  Recommend or set healthy exercise goal based on individual tolerance.  Encourage small steps toward making change in amount of exercise or activity.  Urge reduction of sedentary activities or screen time.  Promote group activities within the community or with family or support person.  Consider referral to rehabiliation therapist for assessment and exercise/activity plan.   Notes:       Blood Pressure < 140/90 (pt-stated)      "Keep blood pressure good"      DIET - INCREASE WATER INTAKE      Recommend to drink at least 6-8 8oz glasses of water per day.      Have 3 meals a day      Recommend eating 3 health meals a day.        This is a list of the screening recommended for you and due dates:  Health Maintenance  Topic Date Due   Complete foot exam   01/31/2023*   COVID-19 Vaccine (7 - 2023-24 season) 02/02/2023*   Zoster (Shingles) Vaccine (1 of 2) 04/19/2023*   Flu Shot  08/27/2023*   Mammogram  01/17/2024*   Yearly kidney health urinalysis for diabetes  03/08/2023   Hemoglobin A1C  03/08/2023   Eye exam for diabetics  08/08/2023   Yearly kidney function blood test for diabetes  09/06/2023   Medicare Annual Wellness Visit  01/17/2024   Pneumonia Vaccine  Completed   DEXA scan (bone density measurement)  Completed   HPV Vaccine  Aged Out   DTaP/Tdap/Td vaccine  Discontinued  *Topic was postponed. The date shown is not the original due date.   Health Maintenance  After Age 30 After age 37, you are at a higher risk for certain long-term diseases and infections as well as injuries from falls. Falls are a major cause of broken bones and head injuries in people who are older than age 59. Getting regular preventive care can help to keep you healthy and well. Preventive care includes getting regular testing and making lifestyle changes as recommended by your health care provider. Talk with your health care provider about: Which screenings and tests you should have. A screening is a test that checks for a disease when you have no symptoms. A diet and exercise plan that is right for you. What should I know about screenings and tests to prevent falls? Screening and testing are the best ways to find a health problem early. Early diagnosis and treatment give you the best chance of managing medical conditions that are common after age 42. Certain conditions and lifestyle choices may make you more likely to have a fall. Your health care provider may recommend: Regular vision checks. Poor vision and conditions such as cataracts can make you more likely to have a fall. If you wear glasses, make sure to get your prescription updated if your vision changes. Medicine review. Work with your health care provider to regularly review all of the medicines you are  taking, including over-the-counter medicines. Ask your health care provider about any side effects that may make you more likely to have a fall. Tell your health care provider if any medicines that you take make you feel dizzy or sleepy. Strength and balance checks. Your health care provider may recommend certain tests to check your strength and balance while standing, walking, or changing positions. Foot health exam. Foot pain and numbness, as well as not wearing proper footwear, can make you more likely to have a fall. Screenings, including: Osteoporosis screening. Osteoporosis is a condition that causes the bones to get  weaker and break more easily. Blood pressure screening. Blood pressure changes and medicines to control blood pressure can make you feel dizzy. Depression screening. You may be more likely to have a fall if you have a fear of falling, feel depressed, or feel unable to do activities that you used to do. Alcohol use screening. Using too much alcohol can affect your balance and may make you more likely to have a fall. Follow these instructions at home: Lifestyle Do not drink alcohol if: Your health care provider tells you not to drink. If you drink alcohol: Limit how much you have to: 0-1 drink a day for women. 0-2 drinks a day for men. Know how much alcohol is in your drink. In the [place] one drink equals one 12 oz bottle of beer (355 mL), one 5 oz glass of wine (148 mL), or one 1 oz glass of hard liquor (44 mL). Do not use any products that contain nicotine or tobacco. These products include cigarettes, chewing tobacco, and vaping devices, such as e-cigarettes. If you need help quitting, ask your health care provider. Activity  Follow a regular exercise program to stay fit. This will help you maintain your balance. Ask your health care provider what types of exercise are appropriate for you. If you need a cane or walker, use it as recommended by your health care provider. Wear supportive shoes that have nonskid soles. Safety  Remove any tripping hazards, such as rugs, cords, and clutter. Install safety equipment such as grab bars in bathrooms and safety rails on stairs. Keep rooms and walkways well-lit. General instructions Talk with your health care provider about your risks for falling. Tell your health care provider if: You fall. Be sure to tell your health care provider about all falls, even ones that seem minor. You feel dizzy, tiredness (fatigue), or off-balance. Take over-the-counter and prescription medicines only as told by your health care provider. These include  supplements. Eat a healthy diet and maintain a healthy weight. A healthy diet includes low-fat dairy products, low-fat (lean) meats, and fiber from whole grains, beans, and lots of fruits and vegetables. Stay current with your vaccines. Schedule regular health, dental, and eye exams. Summary Having a healthy lifestyle and getting preventive care can help to protect your health and wellness after age 31. Screening and testing are the best way to find a health problem early and help you avoid having a fall. Early diagnosis and treatment give you the best chance for managing medical conditions that are more common for people who are older than age 55. Falls are a major cause of broken bones and head injuries in people who are older than age 69. Take precautions to prevent a fall at home. Work with your health care provider to learn what changes you can make to improve your health and wellness and to prevent falls. This information is not intended to replace  advice given to you by your health care provider. Make sure you discuss any questions you have with your health care provider. Document Revised: 10/04/2020 Document Reviewed: 10/04/2020 Elsevier Patient Education  2024 [employer].

## 2023-01-17 NOTE — Progress Notes (Signed)
Sent in losartan and potassium

## 2023-01-17 NOTE — Progress Notes (Signed)
Subjective:   Angela Pope is a 82 y.o. female who presents for Medicare Annual (Subsequent) preventive examination.  Visit Complete: Virtual  I connected with  Krissy Twersky on 01/17/23 by a audio enabled telemedicine application and verified that I am speaking with the correct person using two identifiers.  Patient Location: Home  Provider Location: Office/Clinic  I discussed the limitations of evaluation and management by telemedicine. The patient expressed understanding and agreed to proceed.      Objective:    Today's Vitals   01/17/23 0929  Weight: 122 lb (55.3 kg)  Height: 5\' 5"  (1.651 m)   Body mass index is 20.3 kg/m.     01/17/2023    9:38 AM 01/10/2021    8:26 AM 01/07/2020    8:22 AM 01/06/2019    8:27 AM 10/31/2017   11:33 AM 03/15/2017   11:30 AM 10/30/2016    9:32 AM  Advanced Directives  Does Patient Have a Medical Advance Directive? Yes Yes Yes Yes Yes No Yes  Type of Estate agent of [employer] Power of [place];Living will Healthcare Power of [place];Living will Healthcare Power of [place];Living will Healthcare Power of [place];Living will  Healthcare Power of [place];Living will  Does patient want to make changes to medical advance directive? No - Patient declined        Copy of Healthcare Power of Attorney in Chart? No - copy requested Yes - validated most recent copy scanned in chart (See row information) No - copy requested No - copy requested No - copy requested  No - copy requested    Current Medications (verified) Outpatient Encounter Medications as of 01/17/2023  Medication Sig   Calcium Carb-Cholecalciferol (CALCIUM 1000 + D) 1000-800 MG-UNIT TABS Take 1 tablet by mouth daily.   glucose blood test strip Use to check Blood Sugar 3 times daily for ICD10: E11.9(One touch ultra blue brand. )   hydrochlorothiazide (HYDRODIURIL) 25 MG tablet TAKE 1 TABLET(25 MG) BY MOUTH DAILY   Lancets (ONETOUCH ULTRASOFT) lancets  Use to check Blood Sugar 3 times daily for ICD 10:  E11.9  (Touch Twist Lancets for Ultra Touch)   loperamide (IMODIUM) 2 MG capsule Take 2 mg by mouth as needed for diarrhea or loose stools.   losartan (COZAAR) 100 MG tablet TAKE 1 TABLET(100 MG) BY MOUTH DAILY   metFORMIN (GLUCOPHAGE) 500 MG tablet Take 1 tablet (500 mg total) by mouth 2 (two) times daily with a meal.   Multiple Vitamin (MULTIVITAMIN) capsule Take 1 capsule by mouth daily.   potassium chloride (KLOR-CON) 10 MEQ tablet TAKE 1 TABLET(10 MEQ) BY MOUTH DAILY   simvastatin (ZOCOR) 40 MG tablet TAKE 1 TABLET(40 MG) BY MOUTH DAILY   vitamin E 180 MG (400 UNITS) capsule Take 400 Units by mouth daily.   No facility-administered encounter medications on file as of 01/17/2023.    Allergies (verified) Sulfa antibiotics, Tetracyclines & related, and Clarithromycin   History: Past Medical History:  Diagnosis Date   Diabetes mellitus without complication (HCC)    controls with diet   Family history of adverse reaction to anesthesia    Daughter and son have problems (like their father) with Succinylcholine   Hyperlipidemia    Hypertension    Past Surgical History:  Procedure Laterality Date   COLONOSCOPY  2015   large intestine surgery     SMALL INTESTINE SURGERY  2012   polyp that could't be removed   Family History  Problem Relation Age of Onset  Heart disease Mother    Hypertension Mother    Atrial fibrillation Mother    Diabetes Brother    Heart disease Brother    Hypertension Brother    Diabetes Paternal Grandfather    Stroke Father    Prostate cancer Brother    Hypertension Brother    Healthy Daughter    Diabetes Son    Breast cancer Neg Hx    Social History   Socioeconomic History   Marital status: Divorced    Spouse name: Not on file   Number of children: 2   Years of education: [employer]   Highest education level: 12th grade  Occupational History   Occupation: Retired  Tobacco Use   Smoking  status: Former    Current packs/day: 0.00    Types: Cigarettes    Start date: 10/19/1955    Quit date: 10/18/1965    Years since quitting: 57.2   Smokeless tobacco: Never   Tobacco comments:    smoking cessation materials not required  Vaping Use   Vaping status: Never Used  Substance and Sexual Activity   Alcohol use: Yes    Comment: occasional once a twice a month   Drug use: No   Sexual activity: Never  Other Topics Concern   Not on file  Social History Narrative   Pt lives alone; daughter close by   Social Determinants of Health   Financial Resource Strain: Low Risk  (01/17/2023)   Overall Financial Resource Strain (CARDIA)    Difficulty of Paying Living Expenses: Not hard at all  Food Insecurity: No Food Insecurity (01/17/2023)   Hunger Vital Sign    Worried About Running Out of Food in the Last Year: Never true    Ran Out of Food in the Last Year: Never true  Transportation Needs: No Transportation Needs (01/17/2023)   PRAPARE - Administrator, Civil Service (Medical): No    Lack of Transportation (Non-Medical): No  Physical Activity: Insufficiently Active (01/17/2023)   Exercise Vital Sign    Days of Exercise per Week: 2 days    Minutes of Exercise per Session: 20 min  Stress: No Stress Concern Present (01/17/2023)   [employer] of Occupational Health - Occupational Stress Questionnaire    Feeling of Stress : Only a little  Social Connections: Socially Isolated (01/17/2023)   Social Connection and Isolation Panel [NHANES]    Frequency of Communication with Friends and Family: More than three times a week    Frequency of Social Gatherings with Friends and Family: More than three times a week    Attends Religious Services: Never    Database administrator or Organizations: No    Attends Engineer, structural: Never    Marital Status: Divorced    Tobacco Counseling Counseling given: Yes Tobacco comments: smoking cessation materials not  required   Clinical Intake:  Pre-visit preparation completed: No  Pain : No/denies pain     BMI - recorded: 20.3 Nutritional Status: BMI of 19-24  Normal Nutritional Risks: None Diabetes: Yes CBG done?: No  How often do you need to have someone help you when you read instructions, pamphlets, or other written materials from your doctor or pharmacy?: 1 - Never  Interpreter Needed?: No  Information entered by :: Arthur Holms   Activities of Daily Living    01/17/2023    9:33 AM  In your present state of health, do you have any difficulty performing the following activities:  Hearing? 0  Vision? 0  Difficulty concentrating or making decisions? 0  Walking or climbing stairs? 0  Dressing or bathing? 0  Doing errands, shopping? 0  Preparing Food and eating ? N  Using the Toilet? N  In the past six months, have you accidently leaked urine? N  Do you have problems with loss of bowel control? N  Managing your Medications? N  Managing your Finances? N  Housekeeping or managing your Housekeeping? N    Patient Care Team: Duanne Limerick, MD as PCP - General (Family Medicine)  Indicate any recent Medical Services you may have received from other than [facility] providers in the past year (date may be approximate).     Assessment:   This is a routine wellness examination for [place].  Hearing/Vision screen Hearing Screening - Comments:: No problems hearing on phone Vision Screening - Comments:: Wears glasses- see good with glasses  Dietary issues and exercise activities discussed:     Goals Addressed               This Visit's Progress     Blood Pressure < 140/90 (pt-stated)        "Keep blood pressure good"       Depression Screen    01/17/2023    9:42 AM 01/17/2023    9:37 AM 09/06/2022   10:32 AM 03/07/2022   10:42 AM 01/11/2022    8:17 AM 10/14/2021   10:32 AM 06/16/2021   10:33 AM  PHQ 2/9 Scores  PHQ - 2 Score 0 0 0 0 2 0 0  PHQ- 9 Score   0 0 3 0 0     Fall Risk    01/17/2023    9:40 AM 09/06/2022   10:32 AM 03/07/2022   10:41 AM 01/11/2022    8:24 AM 10/14/2021   10:38 AM  Fall Risk   Falls in the past year? 0 0 0 0 0  Number falls in past yr: 0 0 0 0 0  Injury with Fall? 0 0 0 0 0  Risk for fall due to : No Fall Risks No Fall Risks No Fall Risks No Fall Risks No Fall Risks  Follow up Falls evaluation completed Falls evaluation completed Falls evaluation completed Falls evaluation completed Falls evaluation completed    MEDICARE RISK AT HOME: Medicare Risk at Home Any stairs in or around the home?: Yes If so, are there any without handrails?: No Home free of loose throw rugs in walkways, pet beds, electrical cords, etc?: Yes Adequate lighting in your home to reduce risk of falls?: Yes Life alert?: No (advised to get) Use of a cane, walker or w/c?: Yes (for long distances) Grab bars in the bathroom?: Yes Shower chair or bench in shower?: Yes Elevated toilet seat or a handicapped toilet?: Yes    Cognitive Function:        01/17/2023    9:42 AM 01/11/2022    8:26 AM 05/06/2020    1:30 PM 01/08/2020    8:10 AM 01/07/2020    8:27 AM  6CIT Screen  What Year? 0 points 4 points 0 points 0 points 0 points  What month? 0 points 0 points 0 points 0 points 0 points  What time? 0 points 0 points 0 points 0 points 0 points  Count back from 20 0 points 0 points 0 points 0 points 0 points  Months in reverse 0 points 0 points 0 points 0 points 0 points  Repeat phrase 0 points 0 points  0 points 0 points 0 points  Total Score 0 points 4 points 0 points 0 points 0 points    Immunizations Immunization History  Administered Date(s) Administered   COVID-19, mRNA, vaccine(Comirnaty)12 years and older 05/12/2022   Fluad Quad(high Dose 65+) 02/05/2019, 03/08/2020, 02/14/2021, 03/07/2022   Influenza, High Dose Seasonal PF 03/18/2018   Influenza,inj,Quad PF,6+ Mos 03/10/2015, 03/21/2016, 03/15/2017   PFIZER Comirnaty(Gray Top)Covid-19  Tri-Sucrose Vaccine 09/09/2020   PFIZER(Purple Top)SARS-COV-2 Vaccination 06/29/2019, 07/21/2019, 03/10/2020   Pfizer Covid-19 Vaccine Bivalent Booster 41yrs & up 02/22/2021   Pneumococcal Conjugate-13 05/29/2009   Pneumococcal Polysaccharide-23 05/29/2010   Td 05/29/2010    TDAP status: Due, Education has been provided regarding the importance of this vaccine. Advised may receive this vaccine at local pharmacy or Health Dept. Aware to provide a copy of the vaccination record if obtained from local pharmacy or Health Dept. Verbalized acceptance and understanding.  Flu Vaccine status: Up to date  Pneumococcal vaccine status: Up to date  Covid-19 vaccine status: Completed vaccines  Qualifies for Shingles Vaccine? Yes   Zostavax completed Yes   Shingrix Completed?: No.    Education has been provided regarding the importance of this vaccine. Patient has been advised to call insurance company to determine out of pocket expense if they have not yet received this vaccine. Advised may also receive vaccine at local pharmacy or Health Dept. Verbalized acceptance and understanding.  Screening Tests Health Maintenance  Topic Date Due   FOOT EXAM  01/31/2023 (Originally 10/15/2022)   COVID-19 Vaccine (7 - 2023-24 season) 02/02/2023 (Originally 09/11/2022)   Zoster Vaccines- Shingrix (1 of 2) 04/19/2023 (Originally 03/11/1991)   INFLUENZA VACCINE  08/27/2023 (Originally 12/28/2022)   MAMMOGRAM  01/17/2024 (Originally 04/13/2020)   Diabetic kidney evaluation - Urine ACR  03/08/2023   HEMOGLOBIN A1C  03/08/2023   OPHTHALMOLOGY EXAM  08/08/2023   Diabetic kidney evaluation - eGFR measurement  09/06/2023   Medicare Annual Wellness (AWV)  01/17/2024   Pneumonia Vaccine 36+ Years old  Completed   DEXA SCAN  Completed   HPV VACCINES  Aged Out   DTaP/Tdap/Td  Discontinued    Health Maintenance  There are no preventive care reminders to display for this patient.   Colorectal cancer screening: Type  of screening: Colonoscopy. Completed 06/27/10. Repeat every aged out years  Mammogram status: Completed 2020. Repeat every year- pt refused appt d/t "hurts too bad"  Bone Density status: Completed 04/14/2019. Results reflect: Bone density results: OSTEOPENIA. Repeat every 2 years.-pt does not want  Lung Cancer Screening: (Low Dose CT Chest recommended if Age 51-80 years, 20 pack-year currently smoking OR have quit w/in 15years.) does not qualify.    Additional Screening:  Hepatitis C Screening: does qualify; Completed does not want  Vision Screening: Recommended annual ophthalmology exams for early detection of glaucoma and other disorders of the eye. Is the patient up to date with their annual eye exam?  Yes  Who is the provider or what is the name of the office in which the patient attends annual eye exams? Dr Brooke Dare  Dental Screening: Recommended annual dental exams for proper oral hygiene- sees Dr Doree Albee  Diabetic Foot Exam: Diabetic Foot Exam: Overdue, Pt has been advised about the importance in completing this exam. Pt is scheduled for diabetic foot exam on 01/31/2023.  Community Resource Referral / Chronic Care Management: CRR required this visit?  No   CCM required this visit?  No     Plan:     I have personally reviewed  and noted the following in the patient's chart:   Medical and social history Use of alcohol, tobacco or illicit drugs  Current medications and supplements including opioid prescriptions. Patient is not currently taking opioid prescriptions. Functional ability and status Nutritional status Physical activity Advanced directives List of other physicians Hospitalizations, surgeries, and ER visits in previous 12 months Vitals Screenings to include cognitive, depression, and falls Referrals and appointments  In addition, I have reviewed and discussed with patient certain preventive protocols, quality metrics, and best practice recommendations. A  written personalized care plan for preventive services as well as general preventive health recommendations were provided to patient.     Everitt Amber   01/17/2023   After Visit Summary: (MyChart) Due to this being a telephonic visit, the after visit summary with patients personalized plan was offered to patient via MyChart   Nurse Notes: none needed

## 2023-01-25 ENCOUNTER — Telehealth: Payer: Self-pay | Admitting: Family Medicine

## 2023-01-25 ENCOUNTER — Ambulatory Visit: Payer: Self-pay

## 2023-01-25 NOTE — Telephone Encounter (Signed)
  Chief Complaint: COVID vaccine questions Symptoms: none Frequency:  Pertinent Negatives: Patient denies  Disposition: [] ED /[] Urgent Care (no appt availability in office) / [] Appointment(In office/virtual)/ []  [facility]/ [] Home Care/ [] Refused Recommended Disposition /[] [place] [facility]/ [x]  Follow-up with PCP Additional Notes: Pt would like to know if it is appropriate for her to get the most recently released COVID vaccine.   Summary: advice about covid vaccine   Sorry did not know I forgot to route, but message is complete.  Thank you!  ----- Message from Payton Doughty sent at 01/25/2023 10:07 AM EDT ----- Pt states she has heard about a new covid vaccine out now, and she wants to know if she needs to get the new covid.  Pt states she has had a booster, not sure how many.  She certainly does not want covid.  So does she need to keep getting them? ** The message may be incomplete. It was sent as a result of a timeout. **     Reason for Disposition  COVID-19 vaccine, Frequently Asked Questions (FAQs)  Answer Assessment - Initial Assessment Questions 1. MAIN CONCERN OR SYMPTOM:  "What is your main concern right now?" "What question do you have?" "What's the main symptom you're worried about?" (e.g., fever, pain, redness, swelling)     Should pt get the newest COVID vaccine? 2. VACCINE: "What vaccination did you receive?" (e.g., none; AstraZeneca, J&J, Moderna, Pfizer, other)      All that were suggested by PCP.  Protocols used: Coronavirus (COVID-19) Vaccine Questions and Reactions-A-AH

## 2023-01-25 NOTE — Telephone Encounter (Signed)
Pt is calling back for advice to NT note regarding COVID vaccine. Please advise. CB- [phone]

## 2023-01-26 NOTE — Telephone Encounter (Signed)
Pt is still concerned about the covid vaccine.  She said her friend is telling her that there is a new covid shot for a new mutation.  CB#  [phone]

## 2023-02-06 ENCOUNTER — Telehealth: Payer: Self-pay | Admitting: Family Medicine

## 2023-02-06 NOTE — Telephone Encounter (Signed)
Copied from CRM [id number]. Topic: General - Other >> Feb 06, 2023  1:36 PM Everette C wrote: Reason for CRM: The patient has called to follow up on a previous request for direct contact with T. Lynch when possible to discuss vaccination concerns   Please contact further when available

## 2023-02-06 NOTE — Telephone Encounter (Signed)
Vaccination questions, RSV, Covid, Flu and Pneumonia.

## 2023-02-08 DIAGNOSIS — Z23 Encounter for immunization: Secondary | ICD-10-CM | POA: Diagnosis not present

## 2023-02-13 ENCOUNTER — Telehealth: Payer: Self-pay | Admitting: Family Medicine

## 2023-02-13 NOTE — Telephone Encounter (Signed)
Patient called stated the pharmacy will be contacting practice to inform that she was given the Flu, Pneumonia, Covid and RSV shot. Patient said she will call back later today to see if the info was recvd.

## 2023-03-01 ENCOUNTER — Telehealth: Payer: Self-pay | Admitting: Family Medicine

## 2023-03-01 NOTE — Telephone Encounter (Addendum)
A user error has taken place: encounter opened in error, closed for administrative reasons.

## 2023-03-08 ENCOUNTER — Telehealth: Payer: Self-pay | Admitting: Family Medicine

## 2023-03-08 ENCOUNTER — Ambulatory Visit: Payer: Medicare Other | Admitting: Family Medicine

## 2023-03-08 ENCOUNTER — Other Ambulatory Visit: Payer: Self-pay

## 2023-03-08 DIAGNOSIS — I10 Essential (primary) hypertension: Secondary | ICD-10-CM

## 2023-03-08 DIAGNOSIS — E876 Hypokalemia: Secondary | ICD-10-CM

## 2023-03-08 MED ORDER — POTASSIUM CHLORIDE ER 10 MEQ PO TBCR: EXTENDED_RELEASE_TABLET | ORAL | 0 refills | Status: DC

## 2023-03-08 MED ORDER — LOSARTAN POTASSIUM 100 MG PO TABS: ORAL_TABLET | ORAL | 0 refills | Status: DC

## 2023-03-08 MED ORDER — HYDROCHLOROTHIAZIDE 25 MG PO TABS: ORAL_TABLET | ORAL | 0 refills | Status: DC

## 2023-03-08 NOTE — Telephone Encounter (Signed)
Pt came into office today for an office visit but I had to inform her that her or someone called to cancel the appointment with our [facility]. I explained to patient that it says under reason of cancellation that pt needs to schedule a later date. I gave pt new appt time and date. Pt left and came back into office to explain that she forgot to tell me that she would be running out of medications soon. HYDROCHLOROTHIAZIDE 25 MG TAB POTASSIUM CL 10 MG TAB LOSARTAN 100 MG TAB  Pt uses [facility] in [place].

## 2023-03-08 NOTE — Telephone Encounter (Signed)
Refills sent to [facility].  - Eliette Drumwright

## 2023-03-22 ENCOUNTER — Encounter: Payer: Self-pay | Admitting: Family Medicine

## 2023-03-22 ENCOUNTER — Ambulatory Visit (INDEPENDENT_AMBULATORY_CARE_PROVIDER_SITE_OTHER): Payer: Medicare Other | Admitting: Family Medicine

## 2023-03-22 VITALS — BP 128/76 | HR 75 | Ht 65.0 in | Wt 117.0 lb

## 2023-03-22 DIAGNOSIS — Z7984 Long term (current) use of oral hypoglycemic drugs: Secondary | ICD-10-CM | POA: Diagnosis not present

## 2023-03-22 DIAGNOSIS — I10 Essential (primary) hypertension: Secondary | ICD-10-CM | POA: Diagnosis not present

## 2023-03-22 DIAGNOSIS — E782 Mixed hyperlipidemia: Secondary | ICD-10-CM

## 2023-03-22 DIAGNOSIS — E119 Type 2 diabetes mellitus without complications: Secondary | ICD-10-CM

## 2023-03-22 DIAGNOSIS — E876 Hypokalemia: Secondary | ICD-10-CM | POA: Diagnosis not present

## 2023-03-22 MED ORDER — LOSARTAN POTASSIUM 100 MG PO TABS: ORAL_TABLET | ORAL | 1 refills | Status: DC

## 2023-03-22 MED ORDER — HYDROCHLOROTHIAZIDE 25 MG PO TABS
ORAL_TABLET | ORAL | 1 refills | Status: DC
Start: 2023-03-22 — End: 2023-09-03

## 2023-03-22 MED ORDER — SIMVASTATIN 40 MG PO TABS
ORAL_TABLET | ORAL | 1 refills | Status: DC
Start: 2023-03-22 — End: 2023-09-03

## 2023-03-22 MED ORDER — METFORMIN HCL 500 MG PO TABS
500.0000 mg | ORAL_TABLET | Freq: Two times a day (BID) | ORAL | 1 refills | Status: DC
Start: 2023-03-22 — End: 2023-09-03

## 2023-03-22 MED ORDER — POTASSIUM CHLORIDE ER 10 MEQ PO TBCR
EXTENDED_RELEASE_TABLET | ORAL | 1 refills | Status: DC
Start: 2023-03-22 — End: 2023-09-03

## 2023-03-22 NOTE — Progress Notes (Addendum)
Date:  03/22/2023   Name:  Angela Pope   DOB:  24-Jun-1940   MRN:  [id number]   Chief Complaint: Hypertension  Hypertension This is a chronic problem. The current episode started more than 1 year ago. The problem has been gradually improving since onset. The problem is controlled. Pertinent negatives include no anxiety, blurred vision, chest pain, headaches, malaise/fatigue, neck pain, orthopnea, palpitations, peripheral edema, PND, shortness of breath or sweats. There are no associated agents to hypertension. Risk factors for coronary artery disease include dyslipidemia. Past treatments include diuretics and angiotensin blockers. The current treatment provides moderate improvement. There are no compliance problems.  There is no history of CAD/MI or CVA. There is no history of chronic renal disease, a hypertension causing med or renovascular disease.  Diabetes She presents for her follow-up diabetic visit. She has type 2 diabetes mellitus. Her disease course has been stable. There are no hypoglycemic associated symptoms. Pertinent negatives for hypoglycemia include no headaches, nervousness/anxiousness or sweats. Pertinent negatives for diabetes include no blurred vision, no chest pain, no fatigue, no foot paresthesias, no foot ulcerations, no polydipsia, no polyphagia, no polyuria, no visual change, no weakness and no weight loss. There are no hypoglycemic complications. Symptoms are stable. There are no diabetic complications. Pertinent negatives for diabetic complications include no CVA, heart disease or nephropathy. Current diabetic treatment includes oral agent (monotherapy). She is following a generally healthy diet. Meal planning includes avoidance of concentrated sweets and carbohydrate counting. An ACE inhibitor/angiotensin II receptor blocker is being taken.    Lab Results  Component Value Date   NA 141 09/06/2022   K 4.0 09/06/2022   CO2 22 09/06/2022   GLUCOSE 130 (H) 09/06/2022    BUN 14 09/06/2022   CREATININE 0.68 09/06/2022   CALCIUM 10.0 09/06/2022   EGFR 87 09/06/2022   GFRNONAA 77 06/29/2020   Lab Results  Component Value Date   CHOL 161 09/06/2022   HDL 59 09/06/2022   LDLCALC 85 09/06/2022   TRIG 92 09/06/2022   CHOLHDL 3.4 03/27/2018   Lab Results  Component Value Date   TSH 1.260 03/27/2017   Lab Results  Component Value Date   HGBA1C 5.5 09/06/2022   Lab Results  Component Value Date   WBC 6.2 11/03/2019   HGB 15.8 11/03/2019   HCT 48.6 (H) 11/03/2019   MCV 94 11/03/2019   PLT 306 11/03/2019   Lab Results  Component Value Date   ALT 11 09/06/2022   AST 15 09/06/2022   ALKPHOS 56 09/06/2022   BILITOT 1.8 (H) 09/06/2022   Lab Results  Component Value Date   VD25OH 32.3 06/18/2017     Review of Systems  Constitutional:  Negative for fatigue, malaise/fatigue and weight loss.  Eyes:  Negative for blurred vision.  Respiratory:  Negative for shortness of breath.   Cardiovascular:  Negative for chest pain, palpitations, orthopnea and PND.  Endocrine: Negative for polydipsia, polyphagia and polyuria.  Musculoskeletal:  Negative for neck pain.  Neurological:  Negative for weakness and headaches.  Psychiatric/Behavioral:  The patient is not nervous/anxious.     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   Bilateral lower extremity edema 01/14/2015   Essential hypertension 01/13/2015   Diabetes mellitus type 2, controlled, without complications (HCC) 01/13/2015   Hyperlipidemia 01/13/2015   Overweight (BMI 25.0-29.9) 01/13/2015   Allergic rhinitis 01/13/2015  Hx of colonic polyps 01/13/2015   Hx of resection of large bowel 01/13/2015   Chronic right hip pain 01/13/2015   Vitamin D deficiency 01/13/2015   FH: heart disease 01/13/2015   FH: stroke 01/13/2015   Incomplete emptying of bladder 07/02/2009   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    Allergies  Allergen Reactions   Sulfa Antibiotics    Tetracyclines & Related    Clarithromycin Rash    Past Surgical History:  Procedure Laterality Date   COLONOSCOPY  2015   large intestine surgery     SMALL INTESTINE SURGERY  2012   polyp that could't be removed    Social History   Tobacco Use   Smoking status: Former    Current packs/day: 0.00    Types: Cigarettes    Start date: 10/19/1955    Quit date: 10/18/1965    Years since quitting: 57.4   Smokeless tobacco: Never   Tobacco comments:    smoking cessation materials not required  Vaping Use   Vaping status: Never Used  Substance Use Topics   Alcohol use: Yes    Comment: occasional once a twice a month   Drug use: No     Medication list has been reviewed and updated.  Current Meds  Medication Sig   Calcium Carb-Cholecalciferol (CALCIUM 1000 + D) 1000-800 MG-UNIT TABS Take 1 tablet by mouth daily.   glucose blood test strip Use to check Blood Sugar 3 times daily for ICD10: E11.9(One touch ultra blue brand. )   hydrochlorothiazide (HYDRODIURIL) 25 MG tablet TAKE 1 TABLET(25 MG) BY MOUTH DAILY   Lancets (ONETOUCH ULTRASOFT) lancets Use to check Blood Sugar 3 times daily for ICD 10:  E11.9  (Touch Twist Lancets for Ultra Touch)   loperamide (IMODIUM) 2 MG capsule Take 2 mg by mouth as needed for diarrhea or loose stools.   losartan (COZAAR) 100 MG tablet TAKE 1 TABLET(100 MG) BY MOUTH DAILY   metFORMIN (GLUCOPHAGE) 500 MG tablet Take 1 tablet (500 mg total) by mouth 2 (two) times daily with a meal.   Multiple Vitamin (MULTIVITAMIN) capsule Take 1 capsule by mouth daily.   potassium chloride (KLOR-CON) 10 MEQ tablet TAKE 1 TABLET(10 MEQ) BY MOUTH DAILY   simvastatin (ZOCOR) 40 MG tablet TAKE 1 TABLET(40 MG) BY MOUTH DAILY   vitamin E 180 MG (400 UNITS) capsule Take 400 Units by mouth daily.       03/22/2023    2:52 PM 09/06/2022   10:32 AM 03/07/2022    10:42 AM 10/14/2021   10:32 AM  GAD 7 : Generalized Anxiety Score  Nervous, Anxious, on Edge 1 0 0 0  Control/stop worrying 1 0 0 0  Worry too much - different things 0 0 0 0  Trouble relaxing 0 0 0 0  Restless 1 0 0 0  Easily annoyed or irritable 0 0 0 0  Afraid - awful might happen 0 0 0 0  Total GAD 7 Score 3 0 0 0  Anxiety Difficulty Not difficult at all Not difficult at all Not difficult at all Not difficult at all       03/22/2023    2:52 PM 01/17/2023    9:42 AM 01/17/2023    9:37 AM  Depression screen PHQ 2/9  Decreased Interest 0 0 0  Down, Depressed, Hopeless 0 0 0  PHQ - 2 Score 0 0 0  Altered sleeping 1    Tired, decreased energy 0  Change in appetite 1    Feeling bad or failure about yourself  0    Trouble concentrating 0    Moving slowly or fidgety/restless 0    Suicidal thoughts 0    PHQ-9 Score 2    Difficult doing work/chores Not difficult at all      BP Readings from Last 3 Encounters:  03/22/23 128/76  09/06/22 120/76  03/07/22 122/76    Physical Exam Vitals and nursing note reviewed.  Constitutional:      Appearance: She is well-developed.  HENT:     Head: Normocephalic.     Right Ear: Tympanic membrane and external ear normal.     Left Ear: Tympanic membrane and external ear normal.     Nose: Nose normal. No congestion or rhinorrhea.     Mouth/Throat:     Pharynx: Oropharynx is clear.  Eyes:     General: Lids are everted, no foreign bodies appreciated. No scleral icterus.       Left eye: No foreign body or hordeolum.     Conjunctiva/sclera: Conjunctivae normal.     Right eye: Right conjunctiva is not injected.     Left eye: Left conjunctiva is not injected.     Pupils: Pupils are equal, round, and reactive to light.  Neck:     Thyroid: No thyromegaly.     Vascular: No JVD.     Trachea: No tracheal deviation.  Cardiovascular:     Rate and Rhythm: Normal rate and regular rhythm.     Chest Wall: PMI is not displaced.     Heart sounds:  Normal heart sounds, S1 normal and S2 normal. No murmur heard.    No systolic murmur is present.     No diastolic murmur is present.     No friction rub. No gallop. No S3 or S4 sounds.  Pulmonary:     Effort: Pulmonary effort is normal. No respiratory distress.     Breath sounds: Normal breath sounds. No wheezing, rhonchi or rales.  Abdominal:     General: Bowel sounds are normal.     Palpations: Abdomen is soft. There is no mass.     Tenderness: There is no abdominal tenderness. There is no guarding or rebound.  Musculoskeletal:        General: No tenderness. Normal range of motion.     Cervical back: Normal range of motion and neck supple.  Lymphadenopathy:     Cervical: No cervical adenopathy.  Skin:    General: Skin is warm.     Findings: No rash.  Neurological:     Mental Status: She is alert and oriented to person, place, and time.     Cranial Nerves: No cranial nerve deficit.     Deep Tendon Reflexes: Reflexes normal.  Psychiatric:        Mood and Affect: Mood is not anxious or depressed.     Wt Readings from Last 3 Encounters:  03/22/23 117 lb (53.1 kg)  01/17/23 122 lb (55.3 kg)  09/06/22 124 lb (56.2 kg)    BP 128/76   Pulse 75   Ht 5\' 5"  (1.651 m)   Wt 117 lb (53.1 kg)   SpO2 98%   BMI 19.47 kg/m   Assessment and Plan:     Elizabeth Sauer, MD

## 2023-03-23 LAB — MICROALBUMIN / CREATININE URINE RATIO
Creatinine, Urine: 116.7 mg/dL
Microalb/Creat Ratio: 29 mg/g{creat} (ref 0–29)
Microalbumin, Urine: 33.7 ug/mL

## 2023-03-23 LAB — HEMOGLOBIN A1C
Est. average glucose Bld gHb Est-mCnc: 111 mg/dL
Hgb A1c MFr Bld: 5.5 % (ref 4.8–5.6)

## 2023-08-14 ENCOUNTER — Telehealth: Payer: Self-pay

## 2023-08-14 NOTE — Telephone Encounter (Signed)
 Copied from CRM [id number]. Topic: Clinical - Medical Advice >> Aug 14, 2023  3:01 PM Carlatta H wrote: Reason for CRM: patient received a call from the pharmacy stating that she needed a covid shot//She would like to know if she should have the covid shot//

## 2023-08-17 DIAGNOSIS — Z23 Encounter for immunization: Secondary | ICD-10-CM | POA: Diagnosis not present

## 2023-08-17 NOTE — Telephone Encounter (Signed)
 Called pt let her know she should get the covid vaccine. Pt verbalized understanding.  KP

## 2023-08-30 ENCOUNTER — Ambulatory Visit: Payer: Self-pay | Admitting: Family Medicine

## 2023-09-03 ENCOUNTER — Ambulatory Visit (INDEPENDENT_AMBULATORY_CARE_PROVIDER_SITE_OTHER): Admitting: Family Medicine

## 2023-09-03 ENCOUNTER — Encounter: Payer: Self-pay | Admitting: Family Medicine

## 2023-09-03 VITALS — BP 134/74 | HR 80 | Ht 65.0 in | Wt 121.0 lb

## 2023-09-03 DIAGNOSIS — I10 Essential (primary) hypertension: Secondary | ICD-10-CM | POA: Diagnosis not present

## 2023-09-03 DIAGNOSIS — E119 Type 2 diabetes mellitus without complications: Secondary | ICD-10-CM | POA: Diagnosis not present

## 2023-09-03 DIAGNOSIS — Z7984 Long term (current) use of oral hypoglycemic drugs: Secondary | ICD-10-CM | POA: Diagnosis not present

## 2023-09-03 DIAGNOSIS — E876 Hypokalemia: Secondary | ICD-10-CM

## 2023-09-03 DIAGNOSIS — E782 Mixed hyperlipidemia: Secondary | ICD-10-CM | POA: Diagnosis not present

## 2023-09-03 MED ORDER — SIMVASTATIN 40 MG PO TABS: ORAL_TABLET | ORAL | 1 refills | Status: DC

## 2023-09-03 MED ORDER — LOSARTAN POTASSIUM 100 MG PO TABS: ORAL_TABLET | ORAL | 1 refills | Status: DC

## 2023-09-03 MED ORDER — METFORMIN HCL 500 MG PO TABS
500.0000 mg | ORAL_TABLET | Freq: Two times a day (BID) | ORAL | 1 refills | Status: DC
Start: 2023-09-03 — End: 2024-01-03

## 2023-09-03 MED ORDER — POTASSIUM CHLORIDE ER 10 MEQ PO TBCR: EXTENDED_RELEASE_TABLET | ORAL | 1 refills | Status: DC

## 2023-09-03 MED ORDER — HYDROCHLOROTHIAZIDE 25 MG PO TABS: ORAL_TABLET | ORAL | 1 refills | Status: DC

## 2023-09-03 NOTE — Progress Notes (Signed)
 Date:  09/03/2023   Name:  Angela Pope   DOB:  1940/09/16   MRN:  [id number]   Chief Complaint: Medical Management of Chronic Issues (Needs a new eye doctor)  Diabetes She presents for her follow-up diabetic visit. She has type 2 diabetes mellitus. Her disease course has been stable. Pertinent negatives for hypoglycemia include no headaches or sweats. There are no diabetic associated symptoms. Pertinent negatives for diabetes include no blurred vision, no chest pain, no foot paresthesias, no foot ulcerations, no polydipsia, no polyuria, no visual change, no weakness and no weight loss. There are no hypoglycemic complications. Symptoms are stable. There are no diabetic complications. Pertinent negatives for diabetic complications include no CVA. Risk factors for coronary artery disease include diabetes mellitus and dyslipidemia. She is compliant with treatment all of the time. She is following a generally healthy diet. Meal planning includes avoidance of concentrated sweets and carbohydrate counting. She participates in exercise daily. An ACE inhibitor/angiotensin II receptor blocker is being taken. Eye exam is not current.  Hypertension This is a chronic problem. The current episode started more than 1 year ago. The problem has been gradually improving since onset. The problem is controlled. Pertinent negatives include no anxiety, blurred vision, chest pain, headaches, malaise/fatigue, neck pain, orthopnea, palpitations, peripheral edema, PND, shortness of breath or sweats. There are no associated agents to hypertension. Risk factors for coronary artery disease include dyslipidemia and diabetes mellitus. Past treatments include angiotensin blockers and diuretics. The current treatment provides moderate improvement. There are no compliance problems.  There is no history of CAD/MI or CVA. There is no history of chronic renal disease, a hypertension causing med or renovascular disease.   Hyperlipidemia This is a chronic problem. The current episode started more than 1 year ago. The problem is controlled. Recent lipid tests were reviewed and are normal. She has no history of chronic renal disease. Pertinent negatives include no chest pain, focal sensory loss, focal weakness, leg pain, myalgias or shortness of breath. Current antihyperlipidemic treatment includes statins. The current treatment provides moderate improvement of lipids. There are no compliance problems.  Risk factors for coronary artery disease include dyslipidemia, diabetes mellitus and hypertension.    Lab Results  Component Value Date   NA 141 09/06/2022   K 4.0 09/06/2022   CO2 22 09/06/2022   GLUCOSE 130 (H) 09/06/2022   BUN 14 09/06/2022   CREATININE 0.68 09/06/2022   CALCIUM 10.0 09/06/2022   EGFR 87 09/06/2022   GFRNONAA 77 06/29/2020   Lab Results  Component Value Date   CHOL 161 09/06/2022   HDL 59 09/06/2022   LDLCALC 85 09/06/2022   TRIG 92 09/06/2022   CHOLHDL 3.4 03/27/2018   Lab Results  Component Value Date   TSH 1.260 03/27/2017   Lab Results  Component Value Date   HGBA1C 5.5 03/22/2023   Lab Results  Component Value Date   WBC 6.2 11/03/2019   HGB 15.8 11/03/2019   HCT 48.6 (H) 11/03/2019   MCV 94 11/03/2019   PLT 306 11/03/2019   Lab Results  Component Value Date   ALT 11 09/06/2022   AST 15 09/06/2022   ALKPHOS 56 09/06/2022   BILITOT 1.8 (H) 09/06/2022   Lab Results  Component Value Date   VD25OH 32.3 06/18/2017     Review of Systems  Constitutional:  Negative for fever, malaise/fatigue and weight loss.  Eyes:  Negative for blurred vision.  Respiratory:  Negative for cough, chest tightness, shortness of breath  and wheezing.   Cardiovascular:  Negative for chest pain, palpitations, orthopnea, leg swelling and PND.  Gastrointestinal:  Negative for abdominal distention, abdominal pain and blood in stool.  Endocrine: Negative for polydipsia and polyuria.   Genitourinary:  Negative for hematuria and vaginal bleeding.  Musculoskeletal:  Negative for myalgias and neck pain.  Neurological:  Negative for focal weakness, weakness and headaches.    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/2015   Chronic right hip pain 01/13/2015   Vitamin D deficiency 01/13/2015   FH: heart disease 01/13/2015   FH: stroke 01/13/2015   Incomplete emptying of bladder 07/02/2009   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    Allergies  Allergen Reactions   Sulfa Antibiotics    Tetracyclines & Related    Clarithromycin Rash    Past Surgical History:  Procedure Laterality Date   COLONOSCOPY  2015   large intestine surgery     SMALL INTESTINE SURGERY  2012   polyp that could't be removed    Social History   Tobacco Use   Smoking status: Former    Current packs/day: 0.00    Types: Cigarettes    Start date: 10/19/1955    Quit date: 10/18/1965    Years since quitting: 57.9   Smokeless tobacco: Never   Tobacco comments:    smoking cessation materials not required  Vaping Use   Vaping status: Never Used  Substance Use Topics   Alcohol use: Yes    Comment: occasional once a twice a month   Drug use: No     Medication list has been reviewed and updated.  Current Meds  Medication Sig   Calcium Carb-Cholecalciferol (CALCIUM 1000 + D) 1000-800 MG-UNIT TABS Take 1 tablet by mouth daily.   glucose blood test strip Use to check Blood Sugar 3 times daily for ICD10: E11.9(One touch ultra  blue brand. )   hydrochlorothiazide (HYDRODIURIL) 25 MG tablet TAKE 1 TABLET(25 MG) BY MOUTH DAILY   Lancets (ONETOUCH ULTRASOFT) lancets Use to check Blood Sugar 3 times daily for ICD 10:  E11.9  (Touch Twist Lancets for Ultra Touch)   loperamide (IMODIUM) 2 MG capsule Take 2 mg by mouth as needed for diarrhea or loose stools.   losartan (COZAAR) 100 MG tablet TAKE 1 TABLET(100 MG) BY MOUTH DAILY   metFORMIN (GLUCOPHAGE) 500 MG tablet Take 1 tablet (500 mg total) by mouth 2 (two) times daily with a meal.   Multiple Vitamin (MULTIVITAMIN) capsule Take 1 capsule by mouth daily.   potassium chloride (KLOR-CON) 10 MEQ tablet TAKE 1 TABLET(10 MEQ) BY MOUTH DAILY   simvastatin (ZOCOR) 40 MG tablet TAKE 1 TABLET(40 MG) BY MOUTH DAILY   vitamin E 180 MG (400 UNITS) capsule Take 400 Units by mouth daily.       03/22/2023    2:52 PM 09/06/2022   10:32 AM 03/07/2022   10:42 AM 10/14/2021   10:32 AM  GAD 7 : Generalized Anxiety Score  Nervous, Anxious, on Edge 1 0 0 0  Control/stop worrying 1 0 0 0  Worry too much - different things 0 0 0 0  Trouble relaxing 0 0 0 0  Restless 1 0 0 0  Easily annoyed or irritable 0 0 0 0  Afraid - awful might happen 0 0 0 0  Total GAD 7 Score 3 0 0 0  Anxiety Difficulty Not difficult at all Not difficult at all Not difficult at all Not difficult at all       03/22/2023    2:52 PM 01/17/2023    9:42 AM 01/17/2023    9:37 AM  Depression screen PHQ 2/9  Decreased Interest 0 0 0  Down, Depressed, Hopeless 0 0 0  PHQ - 2 Score 0 0 0  Altered sleeping 1    Tired, decreased energy 0    Change in appetite 1    Feeling bad or failure about yourself  0    Trouble concentrating 0    Moving slowly or fidgety/restless 0    Suicidal thoughts 0    PHQ-9 Score 2    Difficult doing work/chores Not difficult at all      BP Readings from Last 3 Encounters:  09/03/23 134/74  03/22/23 128/76  09/06/22 120/76    Physical Exam Vitals and nursing note reviewed.   Constitutional:      General: She is not in acute distress.    Appearance: She is not diaphoretic.  HENT:     Head: Normocephalic and atraumatic.     Right Ear: Tympanic membrane and external ear normal. There is no impacted cerumen.     Left Ear: Tympanic membrane and external ear normal. There is no impacted cerumen.     Nose: Nose normal. No congestion or rhinorrhea.     Mouth/Throat:     Mouth: Mucous membranes are moist.  Eyes:     General:        Right eye: No discharge.        Left eye: No discharge.     Conjunctiva/sclera: Conjunctivae normal.     Pupils: Pupils are equal, round, and reactive to light.  Neck:     Thyroid: No thyromegaly.     Vascular: No JVD.  Cardiovascular:     Rate and Rhythm: Normal rate and regular rhythm.     Pulses:          Radial pulses are 2+ on the right side and 2+ on the left side.     Heart sounds: Normal heart sounds, S1 normal and S2 normal. No murmur heard.    No systolic murmur is present.     No diastolic murmur is present.     No friction rub. No gallop. No S3 or S4 sounds.  Pulmonary:     Effort: Pulmonary effort is normal.     Breath sounds: Normal breath sounds. No wheezing, rhonchi or rales.  Abdominal:     General: Bowel sounds are normal.     Palpations: Abdomen is soft. There is no mass.     Tenderness: There is no abdominal tenderness. There is no guarding.  Musculoskeletal:     Cervical back: Normal range of motion and neck supple.  Lymphadenopathy:     Cervical: No cervical adenopathy.  Skin:    General: Skin is warm and dry.  Neurological:     Mental Status: She is alert.     Deep Tendon Reflexes: Reflexes are normal and symmetric.     Wt Readings from Last 3 Encounters:  09/03/23 121 lb (54.9 kg)  03/22/23 117 lb (53.1 kg)  01/17/23 122 lb (55.3 kg)    BP 134/74   Pulse 80   Ht 5\' 5"  (1.651 m)   Wt 121 lb (54.9 kg)   SpO2 100%   BMI 20.14 kg/m   Assessment and Plan:  1. Essential  hypertension Chronic.  Controlled.  Stable.  Blood pressure today is 134/74.  Asymptomatic.  Tolerating medications well.  Continue hydrochlorothiazide 25 mg and losartan 100 mg once a day.  Will check CMP for electrolytes and GFR.  And will recheck patient in 4 months with diabetic recheck. - hydrochlorothiazide (HYDRODIURIL) 25 MG tablet; TAKE 1 TABLET(25 MG) BY MOUTH DAILY  Dispense: 90 tablet; Refill: 1 - losartan (COZAAR) 100 MG tablet; TAKE 1 TABLET(100 MG) BY MOUTH DAILY  Dispense: 90 tablet; Refill: 1 - Comprehensive metabolic panel with GFR  2. Diabetes mellitus treated with oral medication (HCC) (Primary) Chronic.  Controlled.  Stable.  Asymptomatic.  Tolerating medications well.  Patient is not experiencing any polyuria/polydipsia.  Will continue with metformin 500 mg twice a day.  Will check CMP for current level of fasting glucose.  In the meantime continue to recheck in 4 months. - metFORMIN (GLUCOPHAGE) 500 MG tablet; Take 1 tablet (500 mg total) by mouth 2 (two) times daily with a meal.  Dispense: 180 tablet; Refill: 1 - Microalbumin / creatinine urine ratio  3. Hypokalemia Chronic.  Asymptomatic.  Tolerating medication potassium chloride 10 mEq well.  Will check CMP for current electrolytes including potassium.  Will recheck patient and 6 months or sooner if we have to make adjustment in current supplemental dosing of potassium chloride. - potassium chloride (KLOR-CON) 10 MEQ tablet; TAKE 1 TABLET(10 MEQ) BY MOUTH DAILY  Dispense: 90 tablet; Refill: 1 - Comprehensive metabolic panel with GFR  4. Mixed hyperlipidemia Chronic.  Controlled.  Stable.  Asymptomatic.  Without myalgias without muscle weakness.  Continue simvastatin 40 mg once a day.  Will check lipid panel for LDL and triglyceride control. - simvastatin (ZOCOR) 40 MG tablet; TAKE 1 TABLET(40 MG) BY MOUTH DAILY  Dispense: 90 tablet; Refill: 1 - Lipid Panel With LDL/HDL Ratio    Elizabeth Sauer, MD

## 2023-09-04 LAB — COMPREHENSIVE METABOLIC PANEL WITH GFR
ALT: 13 IU/L (ref 0–32)
AST: 20 IU/L (ref 0–40)
Albumin: 4.4 g/dL (ref 3.7–4.7)
Alkaline Phosphatase: 61 IU/L (ref 44–121)
BUN/Creatinine Ratio: 25 (ref 12–28)
BUN: 15 mg/dL (ref 8–27)
Bilirubin Total: 1.5 mg/dL — ABNORMAL HIGH (ref 0.0–1.2)
CO2: 26 mmol/L (ref 20–29)
Calcium: 9.7 mg/dL (ref 8.7–10.3)
Chloride: 103 mmol/L (ref 96–106)
Creatinine, Ser: 0.59 mg/dL (ref 0.57–1.00)
Globulin, Total: 1.8 g/dL (ref 1.5–4.5)
Glucose: 114 mg/dL — ABNORMAL HIGH (ref 70–99)
Potassium: 4.4 mmol/L (ref 3.5–5.2)
Sodium: 141 mmol/L (ref 134–144)
Total Protein: 6.2 g/dL (ref 6.0–8.5)
eGFR: 90 mL/min/{1.73_m2} (ref >59)

## 2023-09-04 LAB — LIPID PANEL WITH LDL/HDL RATIO
Cholesterol, Total: 170 mg/dL (ref 100–199)
HDL: 61 mg/dL (ref >39)
LDL Chol Calc (NIH): 92 mg/dL (ref 0–99)
LDL/HDL Ratio: 1.5 ratio (ref 0.0–3.2)
Triglycerides: 94 mg/dL (ref 0–149)
VLDL Cholesterol Cal: 17 mg/dL (ref 5–40)

## 2023-09-19 ENCOUNTER — Telehealth: Payer: Self-pay

## 2023-09-19 NOTE — Telephone Encounter (Signed)
 Copied from CRM [id number]. Topic: Clinical - Lab/Test Results >> Sep 19, 2023 12:41 PM Star East wrote: Reason for CRM: Patient wants A1C levels [phone] [email or web address]- would like her results sent to email

## 2023-09-19 NOTE — Telephone Encounter (Signed)
 Spoke with patient and informed her of lab results. She verbalized understanding.   JM

## 2023-10-17 ENCOUNTER — Telehealth: Payer: Self-pay

## 2023-10-17 NOTE — Telephone Encounter (Signed)
 Copied from CRM [id number]. Topic: Clinical - Medical Advice >> Oct 17, 2023 12:57 PM Angela Pope T wrote: Reason for CRM: patient called to see if she need to get another covid shot or what she needs to do. Please f/u with patient

## 2023-11-08 ENCOUNTER — Ambulatory Visit: Payer: Self-pay

## 2023-11-08 NOTE — Telephone Encounter (Signed)
 FYI Only or Action Required?: FYI only for provider  Patient was last seen in primary care on 09/03/2023 by Clarise Crooks, MD. Called Nurse Triage reporting Memory Loss. Symptoms began daughter states memory problems worse over last year. Interventions attempted: Nothing. Symptoms are: gradually worsening.  Triage Disposition: See HCP Within 4 Hours (Or PCP Triage)  Patient/caregiver understands and will follow disposition?: Yes   Reason for Disposition  [1] Longstanding confusion (e.g., dementia, stroke) AND [2] worsening    Pt daughter requested appmt for tm, pt stable  Answer Assessment - Initial Assessment Questions 1. MAIN CONCERN OR SYMPTOM:  What is your main concern right now? What questions do you have? What's the main symptom you're worried about? (e.g., confusion, memory loss)     Gradually getting worse, re markedly  2. ONSET:  When did the symptom start (or worsen)? (minutes, hours, days, weeks)     Memory loss has been going on for a while 3. BETTER-SAME-WORSE: Are you (the patient) getting better, staying the same, or getting worse compared to the day you (they) were diagnosed or most recent hospital discharge ?     Pt lives by herself at the moment 4. DIAGNOSIS: Was the dementia diagnosed by a doctor? If Yes, ask: When? (e.g., days, months, years ago)     Med hx: memory loss  5. MEDICINES: Has there been any change in medicines recently? (e.g., narcotics, antihistamines, benzodiazepines, etc.)     daughter does not know how well pt is adhearing to med regimen.   6. OTHER SYMPTOMS: Are there any other symptoms? (e.g., fever, cough, pain, falling)     Appetite getting less and less, daughter does not know how well pt is adhearing to med regimen.  7. SUPPORT: Document living circumstances and support (e.g., family, nursing home)     Daughter support  Protocols used: Dementia Symptoms and Questions-A-AH, Confusion - Delirium-A-AH

## 2023-11-09 ENCOUNTER — Ambulatory Visit (INDEPENDENT_AMBULATORY_CARE_PROVIDER_SITE_OTHER): Admitting: Student

## 2023-11-09 ENCOUNTER — Encounter: Payer: Self-pay | Admitting: Student

## 2023-11-09 VITALS — BP 122/76 | HR 76 | Ht 65.0 in | Wt 125.8 lb

## 2023-11-09 DIAGNOSIS — G3184 Mild cognitive impairment, so stated: Secondary | ICD-10-CM | POA: Diagnosis not present

## 2023-11-09 DIAGNOSIS — R413 Other amnesia: Secondary | ICD-10-CM | POA: Diagnosis not present

## 2023-11-09 DIAGNOSIS — Z7984 Long term (current) use of oral hypoglycemic drugs: Secondary | ICD-10-CM

## 2023-11-09 DIAGNOSIS — E119 Type 2 diabetes mellitus without complications: Secondary | ICD-10-CM

## 2023-11-09 NOTE — Assessment & Plan Note (Signed)
 6CIT today is 5, increased from 4 when checked in 2023. She denies difficulties with independent living or issues with mood. Discussed secondary causes and will check TSH and B12 today.

## 2023-11-09 NOTE — Assessment & Plan Note (Signed)
 Diabetes is well controlled on metformin  500 mg twice daily. A1c for the last 2 years have been below 6%. Discussed discontinuing given pill burden. Will check A1c today as last check in October 2024, still well controlled will have her stop metformin  and check fasting CBGs at home and message if elevated. Daughter will help her set up mychart to assist with this.

## 2023-11-09 NOTE — Progress Notes (Signed)
 Established Patient Office Visit  Subjective   Patient ID: Angela Pope, female    DOB: 04/12/1941  Age: 83 y.o. MRN: [id number]  Chief Complaint  Patient presents with   New Patient (Initial Visit)   Memory Loss    Patient presents with daughter today for evaluation of worsening memory. She has a history of mild cognitive impairment. Recent was on vacation with family and her daughter was concerned about worsening memory changes.  Daughter reports patient frequently repeats herself during the conversation, forgets tasks or conversations she has recently had, frequently requires writing reminders down for herself. Patient reports her mood is good and denies symptoms of depression or anxiety.  Daughter has noted some increase in frustration/irritability that occur sporadically.  She does not have difficulties with sleep.   Patient lives independently and manages her own IADLs including paying bills, cooking, and driving.  Patient and daughter have not noticed much change in her ability to function independently.      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   Bilateral lower extremity edema 01/14/2015   Essential hypertension 01/13/2015   Diabetes mellitus type 2, controlled, without complications (HCC) 01/13/2015   Hyperlipidemia 01/13/2015   Allergic rhinitis 01/13/2015   Hx of colonic polyps 01/13/2015   Hx of resection of large bowel 01/13/2015   Chronic right hip pain 01/13/2015   Vitamin D  deficiency 01/13/2015   FH: heart disease 01/13/2015   FH: stroke 01/13/2015      ROS Refer to HPI    Objective:     BP 122/76   Pulse 76   Ht 5' 5 (1.651 m)   Wt 125 lb 12.8 oz (57.1 kg)   SpO2 98%   BMI 20.93 kg/m  BP Readings from Last 3 Encounters:  11/09/23 122/76  09/03/23 134/74  03/22/23 128/76    Physical Exam Constitutional:       Appearance: Normal appearance.  HENT:     Mouth/Throat:     Mouth: Mucous membranes are moist.     Pharynx: Oropharynx is clear.   Cardiovascular:     Rate and Rhythm: Normal rate and regular rhythm.  Pulmonary:     Effort: Pulmonary effort is normal.     Breath sounds: No rhonchi or rales.  Abdominal:     General: Abdomen is flat. Bowel sounds are normal. There is no distension.     Palpations: Abdomen is soft.     Tenderness: There is no abdominal tenderness.   Musculoskeletal:        General: Normal range of motion.     Right lower leg: No edema.     Left lower leg: No edema.     Comments: Increased effort with standing from seated positioning, requiring compensation with arms     Skin:    General: Skin is warm and dry.     Capillary Refill: Capillary refill takes less than 2 seconds.   Neurological:     General: No focal deficit present.     Mental Status: She is alert.     Cranial Nerves: No cranial nerve deficit.     Gait: Gait abnormal (shuffling).     Comments: Oriented to year but not month or day. Oriented to situation, answering questions appropriately.   Psychiatric:        Mood and Affect: Mood normal.  Behavior: Behavior normal.        11/09/2023    1:38 PM 03/22/2023    2:52 PM 01/17/2023    9:42 AM  Depression screen PHQ 2/9  Decreased Interest 0 0 0  Down, Depressed, Hopeless 0 0 0  PHQ - 2 Score 0 0 0  Altered sleeping 1 1   Tired, decreased energy 1 0   Change in appetite 0 1   Feeling bad or failure about yourself  0 0   Trouble concentrating 0 0   Moving slowly or fidgety/restless 0 0   Suicidal thoughts 0 0   PHQ-9 Score 2 2   Difficult doing work/chores Not difficult at all Not difficult at all        11/09/2023    1:38 PM 03/22/2023    2:52 PM 09/06/2022   10:32 AM 03/07/2022   10:42 AM  GAD 7 : Generalized Anxiety Score  Nervous, Anxious, on Edge 1 1 0 0  Control/stop worrying 1 1 0 0  Worry too much - different things 1 0  0 0  Trouble relaxing 0 0 0 0  Restless 0 1 0 0  Easily annoyed or irritable 0 0 0 0  Afraid - awful might happen 0 0 0 0  Total GAD 7 Score 3 3 0 0  Anxiety Difficulty Not difficult at all Not difficult at all Not difficult at all Not difficult at all    No results found for any visits on 11/09/23.    The ASCVD Risk score (Arnett DK, et al., 2019) failed to calculate for the following reasons:   The 2019 ASCVD risk score is only valid for ages 15 to 55       11/09/2023    2:24 PM 01/17/2023    9:42 AM 01/11/2022    8:26 AM 05/06/2020    1:30 PM 01/08/2020    8:10 AM  6CIT Screen  What Year? 0 points 0 points 4 points 0 points 0 points  What month? 3 points 0 points 0 points 0 points 0 points  What time? 0 points 0 points 0 points 0 points 0 points  Count back from 20 0 points 0 points 0 points 0 points 0 points  Months in reverse 0 points 0 points 0 points 0 points 0 points  Repeat phrase 2 points 0 points 0 points 0 points 0 points  Total Score 5 points 0 points 4 points 0 points 0 points     Assessment & Plan:  Memory change Assessment & Plan: 6CIT today is 5, increased from 4 when checked in 2023. She denies difficulties with independent living or issues with mood. Discussed secondary causes and will check TSH and B12 today.   Orders: -     Vitamin B12 -     TSH -     CBC -     Ambulatory referral to Neurology  Controlled type 2 diabetes mellitus without complication, without long-term current use of insulin (HCC) Assessment & Plan: Diabetes is well controlled on metformin  500 mg twice daily. A1c for the last 2 years have been below 6%. Discussed discontinuing given pill burden. Will check A1c today as last check in October 2024, still well controlled will have her stop metformin  and check fasting CBGs at home and message if elevated. Daughter will help her set up mychart to assist with this.   Orders: -     Hemoglobin A1c  Mild cognitive impairment Assessment &  Plan:  6CIT today is 5, increased from 4 when checked in 2023. She denies difficulties with independent living or issues with mood. Discussed secondary causes and will check TSH and B12 today.       No follow-ups on file.    Barnetta Liberty, MD

## 2023-11-10 LAB — HEMOGLOBIN A1C
Est. average glucose Bld gHb Est-mCnc: 108 mg/dL
Hgb A1c MFr Bld: 5.4 % (ref 4.8–5.6)

## 2023-11-10 LAB — CBC
Hematocrit: 42.5 % (ref 34.0–46.6)
Hemoglobin: 13.7 g/dL (ref 11.1–15.9)
MCH: 30.9 pg (ref 26.6–33.0)
MCHC: 32.2 g/dL (ref 31.5–35.7)
MCV: 96 fL (ref 79–97)
Platelets: 289 10*3/uL (ref 150–450)
RBC: 4.44 x10E6/uL (ref 3.77–5.28)
RDW: 12.4 % (ref 11.7–15.4)
WBC: 5 10*3/uL (ref 3.4–10.8)

## 2023-11-10 LAB — VITAMIN B12: Vitamin B-12: 690 pg/mL (ref 232–1245)

## 2023-11-10 LAB — TSH: TSH: 1.13 u[IU]/mL (ref 0.450–4.500)

## 2023-11-12 ENCOUNTER — Other Ambulatory Visit: Payer: Self-pay | Admitting: Student

## 2023-11-12 ENCOUNTER — Ambulatory Visit: Payer: Self-pay | Admitting: Student

## 2023-11-12 ENCOUNTER — Telehealth: Payer: Self-pay

## 2023-11-12 DIAGNOSIS — E119 Type 2 diabetes mellitus without complications: Secondary | ICD-10-CM

## 2023-11-12 NOTE — Telephone Encounter (Signed)
 Pt given lab results per notes of Dr. Cari Char on 11/12/23. Pt verbalized understanding. She says her blood sugar this morning was 102 and she already took Metformin , but will not take anymore. Advised if BS are greater than 130 multiple days in a row, call us  back, she verbalized understanding.    Copied from CRM [id number]. Topic: Clinical - Lab/Test Results >> Nov 12, 2023 11:34 AM Kevelyn M wrote: Reason for CRM: Patient is calling back for test results. [phone]

## 2023-11-12 NOTE — Progress Notes (Signed)
 [facility] discussed lab results with patient.

## 2023-11-12 NOTE — Progress Notes (Signed)
 Complex Care Management Note Care Guide Note  11/12/2023 Name: Angela Pope MRN: [id number] DOB: 1941/01/19   Complex Care Management Outreach Attempts: An unsuccessful telephone outreach was attempted today to offer the patient information about available complex care management services.  Follow Up Plan:  Additional outreach attempts will be made to offer the patient complex care management information and services.   Encounter Outcome:  No Answer  Lenton Rail , RMA     [place]  [facility], [facility] Guide  Direct Dial: [phone]  Website: [email or web address]

## 2023-11-12 NOTE — Telephone Encounter (Signed)
 Patient stated she needs refills on test strips and lancets to [facility] in [place].    [facility] #[id number] - [place], [place] - [address] AT [facility] OF [address] Phone: [phone]  Fax: [phone]

## 2023-11-12 NOTE — Addendum Note (Signed)
 Addended by: Barnetta Liberty on: 11/12/2023 08:13 AM   Modules accepted: Orders

## 2023-11-14 MED ORDER — GLUCOSE BLOOD VI STRP
ORAL_STRIP | 1 refills | Status: AC
Start: 2023-11-14 — End: ?

## 2023-11-14 MED ORDER — ONETOUCH ULTRASOFT LANCETS MISC: 3 refills | Status: AC

## 2023-11-14 NOTE — Telephone Encounter (Signed)
 Requested Prescriptions  Pending Prescriptions Disp Refills   glucose blood test strip 100 each 3    Sig: Use to check Blood Sugar 3 times daily for ICD10: E11.9(One touch ultra blue brand. )     Endocrinology: Diabetes - Testing Supplies Failed - 11/14/2023  2:17 PM      Failed - Valid encounter within last 12 months    Recent Outpatient Visits           5 days ago Mild cognitive impairment   [facility] Primary [facility] & [facility] at [facility], Camilo Cella, MD   2 months ago Diabetes mellitus treated with oral medication [facility])   [place] [facility] at [facility], MD               Lancets [facility] ULTRASOFT) lancets 100 each 12    Sig: Use to check Blood Sugar 3 times daily for ICD 10:  E11.9  (Touch Twist Lancets for Ultra Touch)     Endocrinology: Diabetes - Testing Supplies Failed - 11/14/2023  2:17 PM      Failed - Valid encounter within last 12 months    Recent Outpatient Visits           5 days ago Mild cognitive impairment   [facility] Primary Care & Sports Medicine at [facility], Camilo Cella, MD   2 months ago Diabetes mellitus treated with oral medication [facility])    Primary Care & Sports Medicine at [facility], MD

## 2023-11-19 NOTE — Progress Notes (Signed)
 Complex Care Management Note Care Guide Note  11/19/2023 Name: Angela Pope MRN: [id number] DOB: [birth year]   Complex Care Management Outreach Attempts: A second unsuccessful outreach was attempted today to offer the patient with information about available complex care management services.  Follow Up Plan:  Additional outreach attempts will be made to offer the patient complex care management information and services.   Encounter Outcome:  No Answer  Jeoffrey Buffalo , RMA     [place]  [facility], [facility] Guide  Direct Dial: [phone]  Website: [email or web address]

## 2023-11-19 NOTE — Progress Notes (Signed)
 Complex Care Management Note  Care Guide Note 11/19/2023 Name: Angela Pope MRN: [id number] DOB: 1941-01-01  Angela Pope is a 83 y.o. year old female who sees Lemon Raisin, MD for primary care. I reached out to [employer] by phone today to offer complex care management services.  [name] was given information about Complex Care Management services today including:   The Complex Care Management services include support from the care team which includes your Nurse Care Manager, Clinical Social Worker, or Pharmacist.  The Complex Care Management team is here to help remove barriers to the health concerns and goals most important to you. Complex Care Management services are voluntary, and the patient may decline or stop services at any time by request to their care team member.   Complex Care Management Consent Status: Patient agreed to services and verbal consent obtained.   Follow up plan:  Telephone appointment with complex care management team member scheduled for:  12/05/2023  Encounter Outcome:  Patient Scheduled  Jeoffrey Buffalo , RMA     [place]  [facility], [facility] Guide  Direct Dial: [phone]  Website: [email or web address]

## 2023-11-22 ENCOUNTER — Encounter: Payer: Self-pay | Admitting: Student

## 2023-11-23 NOTE — Telephone Encounter (Signed)
 Please review and advise patient.   JM

## 2023-12-05 ENCOUNTER — Other Ambulatory Visit: Payer: Self-pay

## 2023-12-05 ENCOUNTER — Telehealth: Payer: Self-pay | Admitting: *Deleted

## 2023-12-20 DIAGNOSIS — R270 Ataxia, unspecified: Secondary | ICD-10-CM | POA: Diagnosis not present

## 2023-12-20 DIAGNOSIS — R413 Other amnesia: Secondary | ICD-10-CM | POA: Diagnosis not present

## 2023-12-20 DIAGNOSIS — Z1331 Encounter for screening for depression: Secondary | ICD-10-CM | POA: Diagnosis not present

## 2023-12-20 DIAGNOSIS — F03A0 Unspecified dementia, mild, without behavioral disturbance, psychotic disturbance, mood disturbance, and anxiety: Secondary | ICD-10-CM | POA: Diagnosis not present

## 2023-12-21 ENCOUNTER — Other Ambulatory Visit: Payer: Self-pay | Admitting: Physician Assistant

## 2023-12-21 DIAGNOSIS — R413 Other amnesia: Secondary | ICD-10-CM

## 2023-12-21 DIAGNOSIS — R270 Ataxia, unspecified: Secondary | ICD-10-CM

## 2023-12-22 ENCOUNTER — Encounter: Payer: Self-pay | Admitting: Student

## 2023-12-24 NOTE — Telephone Encounter (Signed)
 Please review

## 2023-12-26 ENCOUNTER — Ambulatory Visit: Admission: RE | Admit: 2023-12-26 | Source: Ambulatory Visit

## 2023-12-27 ENCOUNTER — Telehealth: Payer: Self-pay

## 2023-12-27 ENCOUNTER — Telehealth: Payer: Self-pay | Admitting: Student

## 2023-12-27 NOTE — Telephone Encounter (Signed)
 Tried to call patient back no answer vm full.  JM

## 2023-12-27 NOTE — Telephone Encounter (Signed)
 E2C2 called  wrong practice to report that patient does not want to have MRI done in [place] and prefers [place] location.

## 2023-12-27 NOTE — Telephone Encounter (Signed)
 Copied from CRM [id number]. Topic: General - Other >> Dec 27, 2023 11:15 AM Winona R wrote: Pt would like to schedule her MRI in [place] not [place] on Aug 13th. She states if its not in [place] she's not going.  Pt originally called the wrong Dr office but has since been given the correct phone number.

## 2023-12-27 NOTE — Telephone Encounter (Signed)
 Spoke with patient and informed her that the MRI machine is down for the next 4 weeks on [place] location she will have to wait to get MRI done. She said could I call her back because she is busy going back and forth to her garage getting car worked on. I let her know I may not be able to call her back until tomorrow. She asked me to call her back in 10 minutes please I said ma'am she hung up. I will call her back later today or tomorrow.  JM

## 2023-12-28 NOTE — Telephone Encounter (Signed)
Please review response

## 2024-01-02 ENCOUNTER — Other Ambulatory Visit: Payer: Self-pay

## 2024-01-02 NOTE — Patient Outreach (Signed)
 LCSW called patient at scheduled time. LCSW discussed the reason for the call.Patient stated that she was unclear why the referral was made. Patient reported that she has a doctor appointment tomorrow to clarify why the MD made the referral because at this time she feels that she is doing fine. LCSW rescheduled appointment for 01/07/2024 at 2:00 PM.

## 2024-01-03 ENCOUNTER — Ambulatory Visit (INDEPENDENT_AMBULATORY_CARE_PROVIDER_SITE_OTHER): Admitting: Student

## 2024-01-03 ENCOUNTER — Encounter: Payer: Self-pay | Admitting: Student

## 2024-01-03 ENCOUNTER — Telehealth: Payer: Self-pay | Admitting: Student

## 2024-01-03 VITALS — BP 112/68 | HR 64 | Ht 65.0 in | Wt 121.5 lb

## 2024-01-03 DIAGNOSIS — G3184 Mild cognitive impairment, so stated: Secondary | ICD-10-CM

## 2024-01-03 DIAGNOSIS — E119 Type 2 diabetes mellitus without complications: Secondary | ICD-10-CM

## 2024-01-03 DIAGNOSIS — E782 Mixed hyperlipidemia: Secondary | ICD-10-CM | POA: Diagnosis not present

## 2024-01-03 DIAGNOSIS — I10 Essential (primary) hypertension: Secondary | ICD-10-CM

## 2024-01-03 MED ORDER — LOSARTAN POTASSIUM 100 MG PO TABS: ORAL_TABLET | ORAL | 1 refills | Status: DC

## 2024-01-03 MED ORDER — HYDROCHLOROTHIAZIDE 25 MG PO TABS: ORAL_TABLET | ORAL | 1 refills | Status: DC

## 2024-01-03 MED ORDER — SIMVASTATIN 40 MG PO TABS: ORAL_TABLET | ORAL | 1 refills | Status: DC

## 2024-01-03 NOTE — Telephone Encounter (Signed)
 Copied from CRM #[id number]. Topic: General - Other >> Jan 03, 2024 12:46 PM Avram MATSU wrote: Reason for CRM: pt stated she originally told her provider not to release information to her daughter. The pt changed her mind and would like for information to be released now.

## 2024-01-03 NOTE — Telephone Encounter (Signed)
 Please review

## 2024-01-03 NOTE — Progress Notes (Unsigned)
 Established Patient Office Visit  Subjective   Patient ID: Angela Pope, female    DOB: [birth year]  Age: 83 y.o. 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   Bilateral lower extremity edema 01/14/2015   Essential hypertension 01/13/2015   Diabetes mellitus type 2, controlled, without complications (HCC) 01/13/2015   Hyperlipidemia 01/13/2015   Allergic rhinitis 01/13/2015   Hx of colonic polyps 01/13/2015   Hx of resection of large bowel 01/13/2015   Chronic right hip pain 01/13/2015   Vitamin D  deficiency 01/13/2015   FH: heart disease 01/13/2015   FH: stroke 01/13/2015      ROS Refer to HPI    Objective:     BP 112/68   Pulse 64   Ht 5' 5 (1.651 m)   Wt 121 lb 8 oz (55.1 kg)   SpO2 98%   BMI 20.22 kg/m  BP Readings from Last 3 Encounters:  01/03/24 112/68  11/09/23 122/76  09/03/23 134/74    Physical Exam     01/03/2024   10:48 AM 11/09/2023    1:38 PM 03/22/2023    2:52 PM  Depression screen PHQ 2/9  Decreased Interest 0 0 0  Down, Depressed, Hopeless 0 0 0  PHQ - 2 Score 0 0 0  Altered sleeping 1 1 1   Tired, decreased energy 1 1 0  Change in appetite 0 0 1  Feeling bad or failure about yourself  0 0 0  Trouble concentrating 0 0 0  Moving slowly or fidgety/restless 0 0 0  Suicidal thoughts 0 0 0  PHQ-9 Score 2 2 2   Difficult doing work/chores Not difficult at all Not difficult at all Not difficult at all       01/03/2024   10:48 AM 11/09/2023    1:38 PM 03/22/2023    2:52 PM 09/06/2022   10:32 AM  GAD 7 : Generalized Anxiety Score  Nervous, Anxious, on Edge 0 1 1 0  Control/stop worrying 1 1 1  0  Worry too much - different things 1 1 0 0   Trouble relaxing 0 0 0 0  Restless 0 0 1 0  Easily annoyed or irritable 0 0 0 0  Afraid - awful might happen 0 0 0 0  Total GAD 7 Score 2 3 3  0  Anxiety Difficulty Not difficult at all Not difficult at all Not difficult at all Not difficult at all    No results found for any visits on 01/03/24.  Last CBC Lab Results  Component Value Date   WBC 5.0 11/09/2023   HGB 13.7 11/09/2023   HCT 42.5 11/09/2023   MCV 96 11/09/2023   MCH 30.9 11/09/2023   RDW 12.4 11/09/2023   PLT 289 11/09/2023   Last metabolic panel Lab Results  Component Value Date   GLUCOSE 114 (H) 09/03/2023   NA 141 09/03/2023   K 4.4 09/03/2023   CL 103 09/03/2023   CO2 26 09/03/2023   BUN 15 09/03/2023   CREATININE 0.59 09/03/2023   EGFR 90 09/03/2023   CALCIUM 9.7 09/03/2023   PHOS 3.8 02/15/2021   PROT 6.2 09/03/2023   ALBUMIN 4.4 09/03/2023   LABGLOB 1.8  09/03/2023   AGRATIO 1.8 09/06/2022   BILITOT 1.5 (H) 09/03/2023   ALKPHOS 61 09/03/2023   AST 20 09/03/2023   ALT 13 09/03/2023      The ASCVD Risk score (Arnett DK, et al., 2019) failed to calculate for the following reasons:   The 2019 ASCVD risk score is only valid for ages 49 to 79    Assessment & Plan:  There are no diagnoses linked to this encounter.   No follow-ups on file.    Harlene Saddler, MD

## 2024-01-04 ENCOUNTER — Ambulatory Visit: Payer: Self-pay | Admitting: Student

## 2024-01-04 DIAGNOSIS — M8580 Other specified disorders of bone density and structure, unspecified site: Secondary | ICD-10-CM | POA: Insufficient documentation

## 2024-01-04 LAB — BASIC METABOLIC PANEL WITH GFR
BUN/Creatinine Ratio: 15 (ref 12–28)
BUN: 10 mg/dL (ref 8–27)
CO2: 24 mmol/L (ref 20–29)
Calcium: 9.9 mg/dL (ref 8.7–10.3)
Chloride: 99 mmol/L (ref 96–106)
Creatinine, Ser: 0.68 mg/dL (ref 0.57–1.00)
Glucose: 118 mg/dL — ABNORMAL HIGH (ref 70–99)
Potassium: 3.8 mmol/L (ref 3.5–5.2)
Sodium: 141 mmol/L (ref 134–144)
eGFR: 87 mL/min/1.73 (ref >59)

## 2024-01-04 NOTE — Assessment & Plan Note (Signed)
 Has upcoming MRI ordered by neurology.

## 2024-01-04 NOTE — Assessment & Plan Note (Addendum)
 BP noted to be low at recent neurology visit on 12/20/2023. She is normotensive today. She does not check BP at home as she has difficulty with manual cuff. Discussed getting an automatic cuff and encouraged to check BP a few times a week prior. Will continue to monitor BP if continue to have low readings will discontinue hydrochlorothiazide . Continue losartan  100 mg daily and hydrochlorothiazide  25 mg daily.  BMP today, if K is normal will discontinue KLOR CON, has been normal since 2024  and only on very low dose

## 2024-01-04 NOTE — Assessment & Plan Note (Addendum)
 Reviewed home glucose measurements. No hypoglycemia since stopping metformin . Will continue off medication and treat her as diet controlled diabetes. Follow up A1c in 2 months. Was previously seeing [facility] for diabetic eye exam but would like to see new provider, she will make appointment with [facility].

## 2024-01-04 NOTE — Assessment & Plan Note (Signed)
 Lipid Panel     Component Value Date/Time   CHOL 170 09/03/2023 1125   TRIG 94 09/03/2023 1125   HDL 61 09/03/2023 1125   CHOLHDL 3.4 03/27/2018 1115   LDLCALC 92 09/03/2023 1125   LABVLDL 17 09/03/2023 1125   Primary prevention. Doing well on simvastatin  40 mg daily. Refill this.

## 2024-01-07 ENCOUNTER — Other Ambulatory Visit: Payer: Self-pay

## 2024-01-07 ENCOUNTER — Other Ambulatory Visit

## 2024-01-07 NOTE — Patient Outreach (Signed)
 LCSW called patient at scheduled time. LCSW discussed the reason for the call.Patient stated that she was unable to follow up with MD however she does not feel like she needs VBCI services at this time. Patient has agreed for LCSW to call her back on 02/07/2024 at 2:00 PM. Patient explained that she does not feel like she needs any memory resources at this time.  Olam Ally, MSW, LCSW [place]  [facility], [facility] Licensed Clinical Social Worker Direct Dial: [phone]

## 2024-01-07 NOTE — Patient Instructions (Signed)
 Visit Information  Thank you for taking time to visit with me today. Please don't hesitate to contact me if I can be of assistance to you before our next scheduled appointment.  Our next appointment is by telephone on 02/07/2024 at 2:00 PM Please call the care guide team at [phone] if you need to cancel or reschedule your appointment.     Please call 911 if you are experiencing a Mental Health or Behavioral Health Crisis or need someone to talk to.  Patient verbalizes understanding of instructions and care plan provided today and agrees to view in MyChart. Active MyChart status and patient understanding of how to access instructions and care plan via MyChart confirmed with patient.     Olam Ally, MSW, LCSW [place]  [facility], [facility] Licensed Clinical Social Worker Direct Dial: [phone]

## 2024-01-09 ENCOUNTER — Ambulatory Visit
Admission: RE | Admit: 2024-01-09 | Discharge: 2024-01-09 | Disposition: A | Discharge status: Home / Self Care | Source: Ambulatory Visit | Attending: Physician Assistant | Admitting: Physician Assistant

## 2024-01-09 DIAGNOSIS — R413 Other amnesia: Secondary | ICD-10-CM | POA: Insufficient documentation

## 2024-01-09 DIAGNOSIS — G319 Degenerative disease of nervous system, unspecified: Secondary | ICD-10-CM | POA: Diagnosis not present

## 2024-01-09 DIAGNOSIS — I6782 Cerebral ischemia: Secondary | ICD-10-CM | POA: Diagnosis not present

## 2024-01-09 DIAGNOSIS — R270 Ataxia, unspecified: Secondary | ICD-10-CM | POA: Diagnosis not present

## 2024-01-31 ENCOUNTER — Encounter: Payer: Medicare Other | Admitting: Family Medicine

## 2024-02-07 ENCOUNTER — Telehealth

## 2024-02-22 ENCOUNTER — Other Ambulatory Visit: Payer: Self-pay | Admitting: Student

## 2024-02-22 DIAGNOSIS — E119 Type 2 diabetes mellitus without complications: Secondary | ICD-10-CM

## 2024-02-22 DIAGNOSIS — E782 Mixed hyperlipidemia: Secondary | ICD-10-CM

## 2024-02-22 DIAGNOSIS — I10 Essential (primary) hypertension: Secondary | ICD-10-CM

## 2024-02-22 NOTE — Telephone Encounter (Signed)
 Copied from CRM #[id number]. Topic: Clinical - Medication Refill >> Feb 22, 2024 11:33 AM Avram MATSU wrote: Medication: hydrochlorothiazide  (HYDRODIURIL ) 25 MG tablet [[id number]] simvastatin  (ZOCOR ) 40 MG tablet [[id number]]  losartan  (COZAAR ) 100 MG tablet [[id number]] metFORMIN  (GLUCOPHAGE ) 500 MG tablet [[id number]] DISCONTINUED  Has the patient contacted their pharmacy? Yes (Agent: If no, request that the patient contact the pharmacy for the refill. If patient does not wish to contact the pharmacy document the reason why and proceed with request.) (Agent: If yes, when and what did the pharmacy advise?)  This is the patient's preferred pharmacy:  [facility] #[id number], [place] - [address] AT [facility] OF [address] [place] [address] Phone: [phone] Fax: [phone]  Is this the correct pharmacy for this prescription? Yes If no, delete pharmacy and type the correct one.   Has the prescription been filled recently? No  Is the patient out of the medication? Yes  Has the patient been seen for an appointment in the last year OR does the patient have an upcoming appointment? Yes  Can we respond through MyChart? No [email or web address]  Agent: Please be advised that Rx refills may take up to 3 business days. We ask that you follow-up with your pharmacy.

## 2024-02-25 NOTE — Telephone Encounter (Signed)
 Too soon for refill,LRF 01/03/24 FOR 90 AND 1 RF.  Requested Prescriptions  Pending Prescriptions Disp Refills   hydrochlorothiazide  (HYDRODIURIL ) 25 MG tablet 90 tablet 1    Sig: TAKE 1 TABLET(25 MG) BY MOUTH DAILY     Cardiovascular: Diuretics - Thiazide Passed - 02/25/2024 12:18 PM      Passed - Cr in normal range and within 180 days    Creatinine, Ser  Date Value Ref Range Status  01/03/2024 0.68 0.57 - 1.00 mg/dL Final         Passed - K in normal range and within 180 days    Potassium  Date Value Ref Range Status  01/03/2024 3.8 3.5 - 5.2 mmol/L Final         Passed - Na in normal range and within 180 days    Sodium  Date Value Ref Range Status  01/03/2024 141 134 - 144 mmol/L Final         Passed - Last BP in normal range    BP Readings from Last 1 Encounters:  01/03/24 112/68         Passed - Valid encounter within last 6 months    Recent Outpatient Visits           1 month ago Essential hypertension   [place] [facility], Harlene, MD   3 months ago Mild cognitive impairment   [place] [facility] at [facility], Harlene, MD   5 months ago Diabetes mellitus treated with oral medication [facility])   [place] [facility] at [facility], MD               simvastatin  (ZOCOR ) 40 MG tablet 90 tablet 1    Sig: TAKE 1 TABLET(40 MG) BY MOUTH DAILY     Cardiovascular:  Antilipid - Statins Failed - 02/25/2024 12:18 PM      Failed - Lipid Panel in normal range within the last 12 months    Cholesterol, Total  Date Value Ref Range Status  09/03/2023 170 100 - 199 mg/dL Final   LDL Chol Calc (NIH)  Date Value Ref Range Status  09/03/2023 92 0 - 99 mg/dL Final   HDL  Date Value Ref Range Status  09/03/2023 61 >39 mg/dL Final   Triglycerides  Date Value Ref Range Status  09/03/2023 94 0 - 149 mg/dL Final         Passed - Patient is not  pregnant      Passed - Valid encounter within last 12 months    Recent Outpatient Visits           1 month ago Essential hypertension   [place] [facility] at [facility], MD   3 months ago Mild cognitive impairment   [facility] at [facility], Harlene, MD   5 months ago Diabetes mellitus treated with oral medication (HCC)    [facility] Medicine at [facility], MD               losartan  (COZAAR ) 100 MG tablet 90 tablet 1    Sig: TAKE 1 TABLET(100 MG) BY MOUTH DAILY     Cardiovascular:  Angiotensin Receptor Blockers Passed - 02/25/2024 12:18 PM      Passed - Cr in normal range and within 180 days  Creatinine, Ser  Date Value Ref Range Status  01/03/2024 0.68 0.57 - 1.00 mg/dL Final         Passed - K in normal range and within 180 days    Potassium  Date Value Ref Range Status  01/03/2024 3.8 3.5 - 5.2 mmol/L Final         Passed - Patient is not pregnant      Passed - Last BP in normal range    BP Readings from Last 1 Encounters:  01/03/24 112/68         Passed - Valid encounter within last 6 months    Recent Outpatient Visits           1 month ago Essential hypertension   [place] [facility] at [facility], Harlene, MD   3 months ago Mild cognitive impairment   [place] [facility] at [facility], Harlene, MD   5 months ago Diabetes mellitus treated with oral medication [facility])   [place] [facility] at [place], [place]               metFORMIN  (GLUCOPHAGE ) 500 MG tablet 180 tablet 1    Sig: Take 1 tablet (500 mg total) by mouth 2 (two) times daily with a meal.     Endocrinology:  Diabetes - Biguanides Failed - 02/25/2024 12:18 PM      Failed - CBC within normal limits and completed in the last 12 months    WBC  Date  Value Ref Range Status  11/09/2023 5.0 3.4 - 10.8 x10E3/uL Final   RBC  Date Value Ref Range Status  11/09/2023 4.44 3.77 - 5.28 x10E6/uL Final   Hemoglobin  Date Value Ref Range Status  11/09/2023 13.7 11.1 - 15.9 g/dL Final   Hematocrit  Date Value Ref Range Status  11/09/2023 42.5 34.0 - 46.6 % Final   MCHC  Date Value Ref Range Status  11/09/2023 32.2 31.5 - 35.7 g/dL Final   [facility]  Date Value Ref Range Status  11/09/2023 30.9 26.6 - 33.0 pg Final   MCV  Date Value Ref Range Status  11/09/2023 96 79 - 97 fL Final   No results found for: PLTCOUNTKUC, LABPLAT, POCPLA RDW  Date Value Ref Range Status  11/09/2023 12.4 11.7 - 15.4 % Final         Passed - Cr in normal range and within 360 days    Creatinine, Ser  Date Value Ref Range Status  01/03/2024 0.68 0.57 - 1.00 mg/dL Final         Passed - HBA1C is between 0 and 7.9 and within 180 days    Hgb A1c MFr Bld  Date Value Ref Range Status  11/09/2023 5.4 4.8 - 5.6 % Final    Comment:             Prediabetes: 5.7 - 6.4          Diabetes: >6.4          Glycemic control for adults with diabetes: <7.0          Passed - eGFR in normal range and within 360 days    GFR calc Af Amer  Date Value Ref Range Status  06/29/2020 89 >59 mL/min/1.73 Final    Comment:    **In accordance with recommendations from the NKF-ASN Task force,**   [facility] is in the process of updating its  eGFR calculation to the   2021 CKD-EPI creatinine equation that estimates kidney function   without a race variable.    GFR calc non Af Amer  Date Value Ref Range Status  06/29/2020 77 >59 mL/min/1.73 Final   eGFR  Date Value Ref Range Status  01/03/2024 87 >59 mL/min/1.73 Final         Passed - B12 Level in normal range and within 720 days    Vitamin B-12  Date Value Ref Range Status  11/09/2023 690 232 - 1,245 pg/mL Final         Passed - Valid encounter within last 6 months    Recent Outpatient Visits           1 month  ago Essential hypertension   [place] [facility] at [facility], Harlene, MD   3 months ago Mild cognitive impairment   [facility] at [facility], Harlene, MD   5 months ago Diabetes mellitus treated with oral medication [facility])   [place] [facility] at [facility] JAYS[facility], MD

## 2024-02-27 ENCOUNTER — Other Ambulatory Visit: Payer: Self-pay

## 2024-02-27 NOTE — Patient Instructions (Signed)
 Visit Information  Thank you for taking time to visit with me today. Please don't hesitate to contact me if I can be of assistance to you before our next scheduled appointment.  Your next care management appointment is no further scheduled appointments.  Patient has declined services at this time.  Please call the care guide team at [phone] if you need to cancel, schedule, or reschedule an appointment.   Please call 911 if you are experiencing a Mental Health or Behavioral Health Crisis or need someone to talk to. Olam Ally, MSW, LCSW  [place]  [facility], [facility] Licensed Clinical Social Worker Direct Dial: [phone]

## 2024-02-27 NOTE — Patient Outreach (Signed)
 LCSW called patient at scheduled time as a follow up for declined services. Patient reports that things well and patient is still declining services. Patient has asked for LCSW to email LCSW's contact information if any needs arise.  Olam Ally, MSW, LCSW [place]  [facility], [facility] Licensed Clinical Social Worker Direct Dial: [phone]

## 2024-03-06 ENCOUNTER — Encounter: Payer: Self-pay | Admitting: Student

## 2024-03-06 ENCOUNTER — Ambulatory Visit (INDEPENDENT_AMBULATORY_CARE_PROVIDER_SITE_OTHER): Admitting: Student

## 2024-03-06 ENCOUNTER — Other Ambulatory Visit: Payer: Self-pay | Admitting: Student

## 2024-03-06 VITALS — BP 114/70 | HR 79 | Ht 65.0 in | Wt 118.4 lb

## 2024-03-06 DIAGNOSIS — E782 Mixed hyperlipidemia: Secondary | ICD-10-CM | POA: Diagnosis not present

## 2024-03-06 DIAGNOSIS — Z23 Encounter for immunization: Secondary | ICD-10-CM | POA: Diagnosis not present

## 2024-03-06 DIAGNOSIS — E119 Type 2 diabetes mellitus without complications: Secondary | ICD-10-CM | POA: Diagnosis not present

## 2024-03-06 DIAGNOSIS — I10 Essential (primary) hypertension: Secondary | ICD-10-CM

## 2024-03-06 MED ORDER — LOSARTAN POTASSIUM 100 MG PO TABS: ORAL_TABLET | ORAL | 1 refills | Status: DC

## 2024-03-06 MED ORDER — HYDROCHLOROTHIAZIDE 25 MG PO TABS: ORAL_TABLET | ORAL | 1 refills | Status: DC

## 2024-03-06 MED ORDER — SIMVASTATIN 40 MG PO TABS: ORAL_TABLET | ORAL | 1 refills | Status: DC

## 2024-03-06 NOTE — Progress Notes (Signed)
 Established Patient Office Visit  Subjective   Patient ID: Angela Pope, female    DOB: 12/24/1940  Age: 83 y.o. MRN: [id number]  Chief Complaint  Patient presents with   Diabetes Mellitus    Angela Pope is a 83 year old person with medical hx listed below presents today for diabetes follow up. She is feeling well today. No acute complaints. Please refer to problem based charting for further details and assessment and plan of current problem and chronic medical conditions.   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   glucose blood test strip Use to check Blood Sugar 3 times daily for ICD10: E11.9(One touch ultra blue brand. )   Lancets (ONETOUCH ULTRASOFT) lancets Use to check Blood Sugar 3 times daily for ICD 10:  E11.9  (Touch Twist Lancets for Ultra Touch)   loperamide (IMODIUM) 2 MG capsule Take 2 mg by mouth as needed for diarrhea or loose stools.   Multiple Vitamin (MULTIVITAMIN) capsule Take 1 capsule by mouth daily.   vitamin E 180 MG (400 UNITS) capsule Take 400 Units by mouth daily.   [DISCONTINUED] hydrochlorothiazide  (HYDRODIURIL ) 25 MG tablet TAKE 1 TABLET(25 MG) BY MOUTH DAILY   [DISCONTINUED] losartan  (COZAAR ) 100 MG tablet TAKE 1 TABLET(100 MG) BY MOUTH DAILY    [DISCONTINUED] potassium chloride  (KLOR-CON ) 10 MEQ tablet Take 10 mEq by mouth daily.   [DISCONTINUED] simvastatin  (ZOCOR ) 40 MG tablet TAKE 1 TABLET(40 MG) BY MOUTH DAILY   hydrochlorothiazide  (HYDRODIURIL ) 25 MG tablet TAKE 1 TABLET(25 MG) BY MOUTH DAILY   losartan  (COZAAR ) 100 MG tablet TAKE 1 TABLET(100 MG) BY MOUTH DAILY   simvastatin  (ZOCOR ) 40 MG tablet TAKE 1 TABLET(40 MG) BY MOUTH DAILY   No facility-administered encounter medications on file as of 03/06/2024.    BP 114/70   Pulse 79   Ht 5' 5 (1.651 m)   Wt 118 lb 6 oz (53.7 kg)   SpO2 99%   BMI 19.70 kg/m  BP Readings from Last 3 Encounters:  03/06/24 114/70  01/03/24 112/68  11/09/23 122/76    Physical Exam Constitutional:      Appearance: Normal appearance.  HENT:     Head: Normocephalic and atraumatic.  Eyes:     Extraocular Movements: Extraocular movements intact.     Pupils: Pupils are equal, round, and reactive to light.  Cardiovascular:     Rate and Rhythm: Normal rate and regular rhythm.  Pulmonary:     Effort: Pulmonary effort is normal.     Breath sounds: No rhonchi or rales.  Abdominal:     General: Abdomen is flat. Bowel sounds are normal. There is no distension.     Palpations: Abdomen is soft.     Tenderness: There is no abdominal tenderness.  Musculoskeletal:  General: Normal range of motion.     Right lower leg: No edema.     Left lower leg: No edema.  Skin:    General: Skin is warm and dry.     Capillary Refill: Capillary refill takes less than 2 seconds.  Neurological:     General: No focal deficit present.     Mental Status: She is alert and oriented to person, place, and time.  Psychiatric:        Mood and Affect: Mood normal.        Behavior: Behavior normal.        03/06/2024    9:47 AM 01/03/2024   10:48 AM 11/09/2023    1:38 PM  Depression screen PHQ 2/9  Decreased Interest 0 0 0  Down, Depressed, Hopeless 0 0 0  PHQ - 2 Score 0 0 0  Altered sleeping  1 1  Tired,  decreased energy  1 1  Change in appetite  0 0  Feeling bad or failure about yourself   0 0  Trouble concentrating  0 0  Moving slowly or fidgety/restless  0 0  Suicidal thoughts  0 0  PHQ-9 Score  2 2  Difficult doing work/chores  Not difficult at all Not difficult at all       03/06/2024    9:47 AM 01/03/2024   10:48 AM 11/09/2023    1:38 PM 03/22/2023    2:52 PM  GAD 7 : Generalized Anxiety Score  Nervous, Anxious, on Edge 0 0 1 1  Control/stop worrying 0 1 1 1   Worry too much - different things  1 1 0  Trouble relaxing  0 0 0  Restless  0 0 1  Easily annoyed or irritable  0 0 0  Afraid - awful might happen  0 0 0  Total GAD 7 Score  2 3 3   Anxiety Difficulty  Not difficult at all Not difficult at all Not difficult at all    Results for orders placed or performed in visit on 03/06/24  POCT glycosylated hemoglobin (Hb A1C)  Result Value Ref Range   Hemoglobin A1C 5.1 4.0 - 5.6 %    Last CBC Lab Results  Component Value Date   WBC 5.0 11/09/2023   HGB 13.7 11/09/2023   HCT 42.5 11/09/2023   MCV 96 11/09/2023   MCH 30.9 11/09/2023   RDW 12.4 11/09/2023   PLT 289 11/09/2023   Last metabolic panel Lab Results  Component Value Date   GLUCOSE 118 (H) 01/03/2024   NA 141 01/03/2024   K 3.8 01/03/2024   CL 99 01/03/2024   CO2 24 01/03/2024   BUN 10 01/03/2024   CREATININE 0.68 01/03/2024   EGFR 87 01/03/2024   CALCIUM 9.9 01/03/2024   PHOS 3.8 02/15/2021   PROT 6.2 09/03/2023   ALBUMIN 4.4 09/03/2023   LABGLOB 1.8 09/03/2023   AGRATIO 1.8 09/06/2022   BILITOT 1.5 (H) 09/03/2023   ALKPHOS 61 09/03/2023   AST 20 09/03/2023   ALT 13 09/03/2023   Last lipids Lab Results  Component Value Date   CHOL 170 09/03/2023   HDL 61 09/03/2023   LDLCALC 92 09/03/2023   TRIG 94 09/03/2023   CHOLHDL 3.4 03/27/2018   Last hemoglobin A1c Lab Results  Component Value Date   HGBA1C 5.1 03/10/2024      The ASCVD Risk score (Arnett DK, et al., 2019) failed to  calculate for the following reasons:   The 2019 ASCVD risk  score is only valid for ages 59 to 18    Assessment & Plan:  Essential hypertension Assessment & Plan: Well controlled on hydrochlorothiazide  25 mg daily and losartan  100 mg daily. Has been on potassium 10 mEq daily. Given low dose discussed increasing dietary K and monitor K off supplementation. BMP today.   Orders: -     hydroCHLOROthiazide ; TAKE 1 TABLET(25 MG) BY MOUTH DAILY  Dispense: 90 tablet; Refill: 1 -     Losartan  Potassium; TAKE 1 TABLET(100 MG) BY MOUTH DAILY  Dispense: 90 tablet; Refill: 1  Mixed hyperlipidemia Assessment & Plan: Continue simvastatin  40 mg daily.   Orders: -     Simvastatin ; TAKE 1 TABLET(40 MG) BY MOUTH DAILY  Dispense: 90 tablet; Refill: 1  Diabetes mellitus type 2, diet controlled, without complications (HCC) Assessment & Plan: Currently diet controlled.  Lab Results  Component Value Date   HGBA1C 5.1 03/10/2024   HGBA1C 5.4 11/09/2023   HGBA1C 5.5 03/22/2023  A1c remains normal off metformin . Will continue to monitor. She is overdue to diabetic eye screening. Urine microalbumin ordered. I reminded her to make appointment with ophthalmology.    Orders: -     Basic metabolic panel with GFR -     Microalbumin / creatinine urine ratio -     POCT glycosylated hemoglobin (Hb A1C)  Encounter for immunization -     Flu vaccine HIGH DOSE PF(Fluzone Trivalent)     Return in about 6 months (around 09/04/2024) for physical.    Harlene Saddler, MD

## 2024-03-06 NOTE — Assessment & Plan Note (Addendum)
 Well controlled on hydrochlorothiazide  25 mg daily and losartan  100 mg daily. Has been on potassium 10 mEq daily. Given low dose discussed increasing dietary K and monitor K off supplementation. BMP today.

## 2024-03-07 NOTE — Telephone Encounter (Signed)
 Please review message

## 2024-03-10 LAB — POCT GLYCOSYLATED HEMOGLOBIN (HGB A1C): Hemoglobin A1C: 5.1 % (ref 4.0–5.6)

## 2024-03-13 NOTE — Telephone Encounter (Signed)
 Please review message

## 2024-03-13 NOTE — Assessment & Plan Note (Addendum)
 Currently diet controlled.  Lab Results  Component Value Date   HGBA1C 5.1 03/10/2024   HGBA1C 5.4 11/09/2023   HGBA1C 5.5 03/22/2023  A1c remains normal off metformin . Will continue to monitor. She is overdue to diabetic eye screening. Urine microalbumin ordered. I reminded her to make appointment with ophthalmology.

## 2024-03-13 NOTE — Assessment & Plan Note (Signed)
 Continue simvastatin 40 mg daily.

## 2024-03-26 DIAGNOSIS — F02A0 Dementia in other diseases classified elsewhere, mild, without behavioral disturbance, psychotic disturbance, mood disturbance, and anxiety: Secondary | ICD-10-CM | POA: Diagnosis not present

## 2024-03-26 DIAGNOSIS — R630 Anorexia: Secondary | ICD-10-CM | POA: Diagnosis not present

## 2024-03-26 DIAGNOSIS — R413 Other amnesia: Secondary | ICD-10-CM | POA: Diagnosis not present

## 2024-03-26 DIAGNOSIS — R270 Ataxia, unspecified: Secondary | ICD-10-CM | POA: Diagnosis not present

## 2024-03-26 DIAGNOSIS — G309 Alzheimer's disease, unspecified: Secondary | ICD-10-CM | POA: Diagnosis not present

## 2024-03-29 DIAGNOSIS — I10 Essential (primary) hypertension: Secondary | ICD-10-CM | POA: Diagnosis not present

## 2024-03-29 DIAGNOSIS — F02A0 Dementia in other diseases classified elsewhere, mild, without behavioral disturbance, psychotic disturbance, mood disturbance, and anxiety: Secondary | ICD-10-CM | POA: Diagnosis not present

## 2024-03-29 DIAGNOSIS — Z7984 Long term (current) use of oral hypoglycemic drugs: Secondary | ICD-10-CM | POA: Diagnosis not present

## 2024-03-29 DIAGNOSIS — E119 Type 2 diabetes mellitus without complications: Secondary | ICD-10-CM | POA: Diagnosis not present

## 2024-03-29 DIAGNOSIS — I959 Hypotension, unspecified: Secondary | ICD-10-CM | POA: Diagnosis not present

## 2024-03-29 DIAGNOSIS — G309 Alzheimer's disease, unspecified: Secondary | ICD-10-CM | POA: Diagnosis not present

## 2024-03-29 DIAGNOSIS — R270 Ataxia, unspecified: Secondary | ICD-10-CM | POA: Diagnosis not present

## 2024-04-02 DIAGNOSIS — G309 Alzheimer's disease, unspecified: Secondary | ICD-10-CM | POA: Diagnosis not present

## 2024-04-02 DIAGNOSIS — I959 Hypotension, unspecified: Secondary | ICD-10-CM | POA: Diagnosis not present

## 2024-04-02 DIAGNOSIS — I10 Essential (primary) hypertension: Secondary | ICD-10-CM | POA: Diagnosis not present

## 2024-04-02 DIAGNOSIS — R270 Ataxia, unspecified: Secondary | ICD-10-CM | POA: Diagnosis not present

## 2024-04-02 DIAGNOSIS — E119 Type 2 diabetes mellitus without complications: Secondary | ICD-10-CM | POA: Diagnosis not present

## 2024-04-02 DIAGNOSIS — F02A0 Dementia in other diseases classified elsewhere, mild, without behavioral disturbance, psychotic disturbance, mood disturbance, and anxiety: Secondary | ICD-10-CM | POA: Diagnosis not present

## 2024-04-03 DIAGNOSIS — I959 Hypotension, unspecified: Secondary | ICD-10-CM | POA: Diagnosis not present

## 2024-04-03 DIAGNOSIS — G309 Alzheimer's disease, unspecified: Secondary | ICD-10-CM | POA: Diagnosis not present

## 2024-04-03 DIAGNOSIS — E119 Type 2 diabetes mellitus without complications: Secondary | ICD-10-CM | POA: Diagnosis not present

## 2024-04-03 DIAGNOSIS — R270 Ataxia, unspecified: Secondary | ICD-10-CM | POA: Diagnosis not present

## 2024-04-03 DIAGNOSIS — I10 Essential (primary) hypertension: Secondary | ICD-10-CM | POA: Diagnosis not present

## 2024-04-03 DIAGNOSIS — F02A0 Dementia in other diseases classified elsewhere, mild, without behavioral disturbance, psychotic disturbance, mood disturbance, and anxiety: Secondary | ICD-10-CM | POA: Diagnosis not present

## 2024-04-08 DIAGNOSIS — R270 Ataxia, unspecified: Secondary | ICD-10-CM | POA: Diagnosis not present

## 2024-04-08 DIAGNOSIS — I959 Hypotension, unspecified: Secondary | ICD-10-CM | POA: Diagnosis not present

## 2024-04-08 DIAGNOSIS — E119 Type 2 diabetes mellitus without complications: Secondary | ICD-10-CM | POA: Diagnosis not present

## 2024-04-08 DIAGNOSIS — F02A0 Dementia in other diseases classified elsewhere, mild, without behavioral disturbance, psychotic disturbance, mood disturbance, and anxiety: Secondary | ICD-10-CM | POA: Diagnosis not present

## 2024-04-08 DIAGNOSIS — I10 Essential (primary) hypertension: Secondary | ICD-10-CM | POA: Diagnosis not present

## 2024-04-08 DIAGNOSIS — G309 Alzheimer's disease, unspecified: Secondary | ICD-10-CM | POA: Diagnosis not present

## 2024-04-10 DIAGNOSIS — F02A0 Dementia in other diseases classified elsewhere, mild, without behavioral disturbance, psychotic disturbance, mood disturbance, and anxiety: Secondary | ICD-10-CM | POA: Diagnosis not present

## 2024-04-10 DIAGNOSIS — I10 Essential (primary) hypertension: Secondary | ICD-10-CM | POA: Diagnosis not present

## 2024-04-10 DIAGNOSIS — I959 Hypotension, unspecified: Secondary | ICD-10-CM | POA: Diagnosis not present

## 2024-04-10 DIAGNOSIS — G309 Alzheimer's disease, unspecified: Secondary | ICD-10-CM | POA: Diagnosis not present

## 2024-04-10 DIAGNOSIS — E119 Type 2 diabetes mellitus without complications: Secondary | ICD-10-CM | POA: Diagnosis not present

## 2024-04-10 DIAGNOSIS — R270 Ataxia, unspecified: Secondary | ICD-10-CM | POA: Diagnosis not present

## 2024-04-14 DIAGNOSIS — G309 Alzheimer's disease, unspecified: Secondary | ICD-10-CM | POA: Diagnosis not present

## 2024-04-14 DIAGNOSIS — I959 Hypotension, unspecified: Secondary | ICD-10-CM | POA: Diagnosis not present

## 2024-04-14 DIAGNOSIS — I10 Essential (primary) hypertension: Secondary | ICD-10-CM | POA: Diagnosis not present

## 2024-04-14 DIAGNOSIS — E119 Type 2 diabetes mellitus without complications: Secondary | ICD-10-CM | POA: Diagnosis not present

## 2024-04-14 DIAGNOSIS — Z7984 Long term (current) use of oral hypoglycemic drugs: Secondary | ICD-10-CM | POA: Diagnosis not present

## 2024-04-14 DIAGNOSIS — R270 Ataxia, unspecified: Secondary | ICD-10-CM | POA: Diagnosis not present

## 2024-04-14 DIAGNOSIS — F02A0 Dementia in other diseases classified elsewhere, mild, without behavioral disturbance, psychotic disturbance, mood disturbance, and anxiety: Secondary | ICD-10-CM | POA: Diagnosis not present

## 2024-04-15 DIAGNOSIS — E119 Type 2 diabetes mellitus without complications: Secondary | ICD-10-CM | POA: Diagnosis not present

## 2024-04-15 DIAGNOSIS — F02A0 Dementia in other diseases classified elsewhere, mild, without behavioral disturbance, psychotic disturbance, mood disturbance, and anxiety: Secondary | ICD-10-CM | POA: Diagnosis not present

## 2024-04-15 DIAGNOSIS — R270 Ataxia, unspecified: Secondary | ICD-10-CM | POA: Diagnosis not present

## 2024-04-15 DIAGNOSIS — I959 Hypotension, unspecified: Secondary | ICD-10-CM | POA: Diagnosis not present

## 2024-04-15 DIAGNOSIS — I10 Essential (primary) hypertension: Secondary | ICD-10-CM | POA: Diagnosis not present

## 2024-04-15 DIAGNOSIS — G309 Alzheimer's disease, unspecified: Secondary | ICD-10-CM | POA: Diagnosis not present

## 2024-04-16 DIAGNOSIS — I10 Essential (primary) hypertension: Secondary | ICD-10-CM | POA: Diagnosis not present

## 2024-04-16 DIAGNOSIS — G309 Alzheimer's disease, unspecified: Secondary | ICD-10-CM | POA: Diagnosis not present

## 2024-04-16 DIAGNOSIS — E119 Type 2 diabetes mellitus without complications: Secondary | ICD-10-CM | POA: Diagnosis not present

## 2024-04-16 DIAGNOSIS — R270 Ataxia, unspecified: Secondary | ICD-10-CM | POA: Diagnosis not present

## 2024-04-16 DIAGNOSIS — I959 Hypotension, unspecified: Secondary | ICD-10-CM | POA: Diagnosis not present

## 2024-04-16 DIAGNOSIS — F02A0 Dementia in other diseases classified elsewhere, mild, without behavioral disturbance, psychotic disturbance, mood disturbance, and anxiety: Secondary | ICD-10-CM | POA: Diagnosis not present

## 2024-04-23 DIAGNOSIS — I10 Essential (primary) hypertension: Secondary | ICD-10-CM | POA: Diagnosis not present

## 2024-04-23 DIAGNOSIS — G309 Alzheimer's disease, unspecified: Secondary | ICD-10-CM | POA: Diagnosis not present

## 2024-04-23 DIAGNOSIS — F02A0 Dementia in other diseases classified elsewhere, mild, without behavioral disturbance, psychotic disturbance, mood disturbance, and anxiety: Secondary | ICD-10-CM | POA: Diagnosis not present

## 2024-04-23 DIAGNOSIS — I959 Hypotension, unspecified: Secondary | ICD-10-CM | POA: Diagnosis not present

## 2024-04-23 DIAGNOSIS — R270 Ataxia, unspecified: Secondary | ICD-10-CM | POA: Diagnosis not present

## 2024-04-23 DIAGNOSIS — E119 Type 2 diabetes mellitus without complications: Secondary | ICD-10-CM | POA: Diagnosis not present

## 2024-05-08 ENCOUNTER — Ambulatory Visit

## 2024-05-13 ENCOUNTER — Other Ambulatory Visit: Payer: Self-pay | Admitting: Student

## 2024-05-13 DIAGNOSIS — E119 Type 2 diabetes mellitus without complications: Secondary | ICD-10-CM

## 2024-05-14 ENCOUNTER — Ambulatory Visit

## 2024-05-14 ENCOUNTER — Other Ambulatory Visit: Payer: Self-pay | Admitting: Student

## 2024-05-14 VITALS — BP 128/74 | Ht 65.0 in | Wt 127.0 lb

## 2024-05-14 DIAGNOSIS — Z0000 Encounter for general adult medical examination without abnormal findings: Secondary | ICD-10-CM | POA: Diagnosis not present

## 2024-05-14 DIAGNOSIS — I10 Essential (primary) hypertension: Secondary | ICD-10-CM

## 2024-05-14 DIAGNOSIS — E782 Mixed hyperlipidemia: Secondary | ICD-10-CM

## 2024-05-14 MED ORDER — LOSARTAN POTASSIUM 100 MG PO TABS: ORAL_TABLET | ORAL | 1 refills | Status: AC

## 2024-05-14 MED ORDER — SIMVASTATIN 40 MG PO TABS: ORAL_TABLET | ORAL | 1 refills | Status: AC

## 2024-05-14 MED ORDER — HYDROCHLOROTHIAZIDE 25 MG PO TABS: ORAL_TABLET | ORAL | 1 refills | Status: AC

## 2024-05-14 NOTE — Progress Notes (Signed)
 Chief Complaint  Patient presents with   Medicare Wellness     Subjective:   Angela Pope is a 83 y.o. female who presents for a Medicare Annual Wellness Visit.  Visit info / Clinical Intake: Medicare Wellness Visit Type:: Subsequent Annual Wellness Visit Persons participating in visit and providing information:: patient Medicare Wellness Visit Mode:: In-person (required for WTM) Interpreter Needed?: No Pre-visit prep was completed: yes AWV questionnaire completed by patient prior to visit?: no Living arrangements:: (!) lives alone Patient's Overall Health Status Rating: good Typical amount of pain: none Does pain affect daily life?: no Are you currently prescribed opioids?: no  Dietary Habits and Nutritional Risks How many meals a day?: 2 Eats fruit and vegetables daily?: yes Most meals are obtained by: preparing own meals In the last 2 weeks, have you had any of the following?: (!) nausea, vomiting, diarrhea (diarrhea occasionally) Diabetic:: (!) yes Any non-healing wounds?: no How often do you check your BS?: 1 (once per day) Would you like to be referred to a Nutritionist or for Diabetic Management? : no  Functional Status Activities of Daily Living (to include ambulation/medication): Independent Ambulation: Independent Medication Administration: Independent Home Management (perform basic housework or laundry): Independent Manage your own finances?: yes Primary transportation is: driving Concerns about vision?: no *vision screening is required for WTM* (wears glasses all day except for reading- MD in [place]) Concerns about hearing?: no  Fall Screening Falls in the past year?: 0 Number of falls in past year: 0 Was there an injury with Fall?: 0 Fall Risk Category Calculator: 0 Patient Fall Risk Level: Low Fall Risk  Fall Risk Patient at Risk for Falls Due to: No Fall Risks Fall risk Follow up: Falls prevention discussed; Falls evaluation completed  Home and  Transportation Safety: All rugs have non-skid backing?: yes All stairs or steps have railings?: (!) no Grab bars in the bathtub or shower?: yes (shower seat) Have non-skid surface in bathtub or shower?: yes Good home lighting?: yes Regular seat belt use?: yes Hospital stays in the last year:: no  Cognitive Assessment Difficulty concentrating, remembering, or making decisions? : no Will 6CIT or Mini Cog be Completed: yes What year is it?: 0 points What month is it?: 0 points Give patient an address phrase to remember (5 components): [address] [place], [place] About what time is it?: 0 points Count backwards from 20 to 1: 0 points Say the months of the year in reverse: 0 points Repeat the address phrase from earlier: 4 points 6 CIT Score: 4 points  Advance Directives (For Healthcare) Does Patient Have a Medical Advance Directive?: Yes Does patient want to make changes to medical advance directive?: No - Patient declined Type of Advance Directive: Healthcare Power of [place]; Living will Copy of Healthcare Power of Attorney in Chart?: Yes - validated most recent copy scanned in chart (See row information) Copy of Living Will in Chart?: Yes - validated most recent copy scanned in chart (See row information)  Reviewed/Updated  Reviewed/Updated: Reviewed All (Medical, Surgical, Family, Medications, Allergies, Care Teams, Patient Goals)    Allergies (verified) Sulfa antibiotics, Tetracycline, Tetracyclines & related, and Clarithromycin   Current Medications (verified) Outpatient Encounter Medications as of 05/14/2024  Medication Sig   Calcium Carb-Cholecalciferol (CALCIUM 1000 + D) 1000-800 MG-UNIT TABS Take 1 tablet by mouth daily.   donepezil (ARICEPT) 5 MG tablet Take 5 mg by mouth at bedtime.   glucose blood test strip Use to check Blood Sugar 3 times daily  for ICD10: E11.9(One touch ultra blue brand. )   hydrochlorothiazide  (HYDRODIURIL ) 25 MG tablet TAKE 1 TABLET(25 MG) BY  MOUTH DAILY   Lancets (ONETOUCH ULTRASOFT) lancets Use to check Blood Sugar 3 times daily for ICD 10:  E11.9  (Touch Twist Lancets for Ultra Touch)   loperamide (IMODIUM) 2 MG capsule Take 2 mg by mouth as needed for diarrhea or loose stools.   losartan  (COZAAR ) 100 MG tablet TAKE 1 TABLET(100 MG) BY MOUTH DAILY   Multiple Vitamin (MULTIVITAMIN) capsule Take 1 capsule by mouth daily.   potassium chloride  (KLOR-CON ) 10 MEQ tablet Take 10 mEq by mouth daily.   simvastatin  (ZOCOR ) 40 MG tablet TAKE 1 TABLET(40 MG) BY MOUTH DAILY   vitamin E 180 MG (400 UNITS) capsule Take 400 Units by mouth daily.   No facility-administered encounter medications on file as of 05/14/2024.    History: Past Medical History:  Diagnosis Date   Diabetes mellitus without complication (HCC)    controls with diet   Family history of adverse reaction to anesthesia    Daughter and son have problems (like their father) with Succinylcholine   Hyperlipidemia    Hypertension    Past Surgical History:  Procedure Laterality Date   COLONOSCOPY  2015   large intestine surgery     SMALL INTESTINE SURGERY  2012   polyp that could't be removed   Family History  Problem Relation Age of Onset   Heart disease Mother    Hypertension Mother    Atrial fibrillation Mother    Diabetes Brother    Heart disease Brother    Hypertension Brother    Diabetes Paternal Grandfather    Stroke Father    Prostate cancer Brother    Hypertension Brother    Healthy Daughter    Diabetes Son    Breast cancer Neg Hx    Social History   Occupational History   Occupation: Retired  Tobacco Use   Smoking status: Former    Current packs/day: 0.00    Types: Cigarettes    Start date: 10/19/1955    Quit date: 10/18/1965    Years since quitting: 58.6   Smokeless tobacco: Never   Tobacco comments:    smoking cessation materials not required  Vaping Use   Vaping status: Never Used  Substance and Sexual Activity   Alcohol use: Yes     Comment: occasional once a twice a month   Drug use: No   Sexual activity: Never   Tobacco Counseling Counseling given: Not Answered Tobacco comments: smoking cessation materials not required  SDOH Screenings   Food Insecurity: No Food Insecurity (05/14/2024)  Housing: Unknown (05/14/2024)  Transportation Needs: No Transportation Needs (05/14/2024)  Utilities: Not At Risk (05/14/2024)  Alcohol Screen: Low Risk (01/17/2023)  Depression (PHQ2-9): Low Risk (05/14/2024)  Financial Resource Strain: Low Risk  (12/20/2023)   Received from [facility]  Physical Activity: Insufficiently Active (05/14/2024)  Social Connections: Socially Isolated (05/14/2024)  Stress: No Stress Concern Present (05/14/2024)  Tobacco Use: Medium Risk (05/14/2024)  Health Literacy: Adequate Health Literacy (05/14/2024)   See flowsheets for full screening details  Depression Screen PHQ 2 & 9 Depression Scale- Over the past 2 weeks, how often have you been bothered by any of the following problems? Little interest or pleasure in doing things: 0 Feeling down, depressed, or hopeless (PHQ Adolescent also includes...irritable): 0 PHQ-2 Total Score: 0 Trouble falling or staying asleep, or sleeping too much: 0 Feeling tired or having little energy:  0 Poor appetite or overeating (PHQ Adolescent also includes...weight loss): 0 Feeling bad about yourself - or that you are a failure or have let yourself or your family down: 0 Trouble concentrating on things, such as reading the newspaper or watching television (PHQ Adolescent also includes...like school work): 0 Moving or speaking so slowly that other people could have noticed. Or the opposite - being so fidgety or restless that you have been moving around a lot more than usual: 0 Thoughts that you would be better off dead, or of hurting yourself in some way: 0 PHQ-9 Total Score: 0 If you checked off any problems, how difficult have these problems made  it for you to do your work, take care of things at home, or get along with other people?: Not difficult at all  Depression Treatment Depression Interventions/Treatment : EYV7-0 Score <4 Follow-up Not Indicated     Goals Addressed             This Visit's Progress    DIET - EAT MORE FRUITS AND VEGETABLES               Objective:    Today's Vitals   05/14/24 1130  BP: 128/74  Weight: 127 lb (57.6 kg)  Height: 5' 5 (1.651 m)   Body mass index is 21.13 kg/m.  Hearing/Vision screen Hearing Screening - Comments:: NO AIDS Vision Screening - Comments:: WEARS GLASSES ALL DAY EXCEPT FOR READING- MD IN [facility] Immunizations and Health Maintenance Health Maintenance  Topic Date Due   Zoster Vaccines- Shingrix (1 of 2) Never done   Mammogram  04/13/2020   OPHTHALMOLOGY EXAM  08/08/2023   Diabetic kidney evaluation - Urine ACR  03/21/2024   COVID-19 Vaccine (9 - 2025-26 season) 03/22/2025 (Originally 01/28/2024)   FOOT EXAM  09/02/2024   HEMOGLOBIN A1C  09/08/2024   Diabetic kidney evaluation - eGFR measurement  01/02/2025   Medicare Annual Wellness (AWV)  05/14/2025   Pneumococcal Vaccine: 50+ Years  Completed   Influenza Vaccine  Completed   Bone Density Scan  Completed   Meningococcal B Vaccine  Aged Out   DTaP/Tdap/Td  Discontinued        Assessment/Plan:  This is a routine wellness examination for Angela Pope.  Patient Care Team: Lemon Raisin, MD as PCP - General (Internal Medicine)  I have personally reviewed and noted the following in the patients chart:   Medical and social history Use of alcohol, tobacco or illicit drugs  Current medications and supplements including opioid prescriptions. Functional ability and status Nutritional status Physical activity Advanced directives List of other physicians Hospitalizations, surgeries, and ER visits in previous 12 months Vitals Screenings to include cognitive, depression, and falls Referrals and  appointments  No orders of the defined types were placed in this encounter.  In addition, I have reviewed and discussed with patient certain preventive protocols, quality metrics, and best practice recommendations. A written personalized care plan for preventive services as well as general preventive health recommendations were provided to patient.   Jhonnie GORMAN Das, LPN   [id number]   Return in 1 year (on 05/14/2025).  After Visit Summary: (In Person-Printed) AVS printed and given to the patient  Nurse Notes: NEEDS SHINGRIX & TDAP; AGED OUT OF MAMMOGRAM & COLONOSCOPY; DEFERS BDS UNTIL CPE IN [facility]

## 2024-05-14 NOTE — Patient Instructions (Addendum)
 Ms. Avino,  Thank you for taking the time for your Medicare Wellness Visit. I appreciate your continued commitment to your health goals. Please review the care plan we discussed, and feel free to reach out if I can assist you further.  Please note that Annual Wellness Visits do not include a physical exam. Some assessments may be limited, especially if the visit was conducted virtually. If needed, we may recommend an in-person follow-up with your provider.  Ongoing Care Seeing your primary care provider every 3 to 6 months helps us  monitor your health and provide consistent, personalized care.   Referrals If a referral was made during today's visit and you haven't received any updates within two weeks, please contact the referred provider directly to check on the status.  Recommended Screenings:  Health Maintenance  Topic Date Due   Zoster (Shingles) Vaccine (1 of 2) Never done   Breast Cancer Screening  04/13/2020   Eye exam for diabetics  08/08/2023   Yearly kidney health urinalysis for diabetes  03/21/2024   COVID-19 Vaccine (9 - 2025-26 season) 03/22/2025*   Complete foot exam   09/02/2024   Hemoglobin A1C  09/08/2024   Yearly kidney function blood test for diabetes  01/02/2025   Medicare Annual Wellness Visit  05/14/2025   Pneumococcal Vaccine for age over 43  Completed   Flu Shot  Completed   Osteoporosis screening with Bone Density Scan  Completed   Meningitis B Vaccine  Aged Out   DTaP/Tdap/Td vaccine  Discontinued  *Topic was postponed. The date shown is not the original due date.       05/14/2024   11:39 AM  Advanced Directives  Does Patient Have a Medical Advance Directive? Yes  Type of Estate Agent of [place];Living will  Does patient want to make changes to medical advance directive? No - Patient declined  Copy of Healthcare Power of Attorney in Chart? Yes - validated most recent copy scanned in chart (See row information)    Vision:  Annual vision screenings are recommended for early detection of glaucoma, cataracts, and diabetic retinopathy. These exams can also reveal signs of chronic conditions such as diabetes and high blood pressure.  Dental: Annual dental screenings help detect early signs of oral cancer, gum disease, and other conditions linked to overall health, including heart disease and diabetes.  Please see the attached documents for additional preventive care recommendations.   NEXT AWV 06/04/25 @ 3:50 PM IN PERSON

## 2024-05-16 NOTE — Telephone Encounter (Signed)
 Requested Prescriptions  Pending Prescriptions Disp Refills   ONETOUCH ULTRA test strip [Pharmacy Med Name: ONE TOUCH ULTRA BLUE TESTST(NEW)100] 100 strip 5    Sig: USE TO CHECK BLOOD SUGAR THREE TIMES DAILY     Endocrinology: Diabetes - Testing Supplies Passed - 05/16/2024 10:51 AM      Passed - Valid encounter within last 12 months    Recent Outpatient Visits           2 months ago Diabetes mellitus type 2, diet controlled, without complications (HCC)   [place] [facility], MD   4 months ago Essential hypertension   [place] [facility], MD   6 months ago Mild cognitive impairment   [facility], [place], MD   8 months ago Diabetes mellitus treated with oral medication [facility])   [place] [facility] at [facility]ryne [facility]YSON, MD

## 2024-05-20 LAB — BASIC METABOLIC PANEL WITH GFR
BUN/Creatinine Ratio: 14 (ref 12–28)
BUN: 10 mg/dL (ref 8–27)
CO2: 28 mmol/L (ref 20–29)
Calcium: 10.4 mg/dL — ABNORMAL HIGH (ref 8.7–10.3)
Chloride: 98 mmol/L (ref 96–106)
Creatinine, Ser: 0.7 mg/dL (ref 0.57–1.00)
Glucose: 122 mg/dL — ABNORMAL HIGH (ref 70–99)
Potassium: 3.9 mmol/L (ref 3.5–5.2)
Sodium: 142 mmol/L (ref 134–144)
eGFR: 86 mL/min/1.73

## 2024-05-20 LAB — MICROALBUMIN / CREATININE URINE RATIO
Creatinine, Urine: 73.2 mg/dL
Microalb/Creat Ratio: 24 mg/g{creat} (ref 0–29)
Microalbumin, Urine: 17.3 ug/mL

## 2024-05-21 ENCOUNTER — Ambulatory Visit: Payer: Self-pay | Admitting: Student

## 2024-06-11 LAB — OPHTHALMOLOGY REPORT-SCANNED

## 2024-09-04 ENCOUNTER — Encounter: Admitting: Student

## 2025-06-04 ENCOUNTER — Ambulatory Visit
